# Patient Record
Sex: Female | Born: 1935 | Race: White | Hispanic: No | Smoking: Never smoker
Health system: Southern US, Community
[De-identification: ages and names within clinical notes are randomized; demographics above are authoritative.]

## PROBLEM LIST (undated history)

## (undated) DIAGNOSIS — N289 Disorder of kidney and ureter, unspecified: Secondary | ICD-10-CM

## (undated) DIAGNOSIS — N183 Chronic kidney disease, stage 3 unspecified: Secondary | ICD-10-CM

## (undated) DIAGNOSIS — I4430 Unspecified atrioventricular block: Secondary | ICD-10-CM

## (undated) DIAGNOSIS — B019 Varicella without complication: Secondary | ICD-10-CM

## (undated) DIAGNOSIS — E039 Hypothyroidism, unspecified: Secondary | ICD-10-CM

## (undated) DIAGNOSIS — F419 Anxiety disorder, unspecified: Secondary | ICD-10-CM

## (undated) DIAGNOSIS — Z8674 Personal history of sudden cardiac arrest: Secondary | ICD-10-CM

## (undated) DIAGNOSIS — K5790 Diverticulosis of intestine, part unspecified, without perforation or abscess without bleeding: Secondary | ICD-10-CM

## (undated) DIAGNOSIS — I10 Essential (primary) hypertension: Secondary | ICD-10-CM

## (undated) DIAGNOSIS — I469 Cardiac arrest, cause unspecified: Secondary | ICD-10-CM

## (undated) DIAGNOSIS — M8580 Other specified disorders of bone density and structure, unspecified site: Secondary | ICD-10-CM

## (undated) DIAGNOSIS — D649 Anemia, unspecified: Secondary | ICD-10-CM

## (undated) DIAGNOSIS — E785 Hyperlipidemia, unspecified: Secondary | ICD-10-CM

## (undated) HISTORY — PX: FOOT SURGERY: SHX648

## (undated) HISTORY — DX: Other specified disorders of bone density and structure, unspecified site: M85.80

## (undated) HISTORY — DX: Hyperlipidemia, unspecified: E78.5

## (undated) HISTORY — DX: Personal history of sudden cardiac arrest: Z86.74

## (undated) HISTORY — DX: Anemia, unspecified: D64.9

## (undated) HISTORY — DX: Hypothyroidism, unspecified: E03.9

## (undated) HISTORY — DX: Diverticulosis of intestine, part unspecified, without perforation or abscess without bleeding: K57.90

## (undated) HISTORY — DX: Varicella without complication: B01.9

## (undated) HISTORY — PX: CATARACT EXTRACTION, BILATERAL: SHX1313

---

## 1968-03-04 HISTORY — PX: TUBAL LIGATION: SHX77

## 1968-03-04 HISTORY — PX: APPENDECTOMY: SHX54

## 2004-04-30 ENCOUNTER — Ambulatory Visit: Payer: Self-pay | Admitting: Family Medicine

## 2005-02-27 ENCOUNTER — Ambulatory Visit: Payer: Self-pay | Admitting: Ophthalmology

## 2005-05-09 ENCOUNTER — Ambulatory Visit: Payer: Self-pay | Admitting: Family Medicine

## 2005-05-20 ENCOUNTER — Observation Stay: Payer: Self-pay | Admitting: Ophthalmology

## 2005-05-20 ENCOUNTER — Other Ambulatory Visit: Payer: Self-pay

## 2005-06-17 ENCOUNTER — Ambulatory Visit: Payer: Self-pay | Admitting: Ophthalmology

## 2006-04-29 ENCOUNTER — Ambulatory Visit: Payer: Self-pay | Admitting: Gastroenterology

## 2006-04-29 HISTORY — PX: COLONOSCOPY: SHX174

## 2006-05-14 ENCOUNTER — Ambulatory Visit: Payer: Self-pay | Admitting: Family Medicine

## 2007-07-02 ENCOUNTER — Ambulatory Visit: Payer: Self-pay | Admitting: Family Medicine

## 2008-06-09 DIAGNOSIS — I4430 Unspecified atrioventricular block: Secondary | ICD-10-CM

## 2008-06-09 HISTORY — DX: Unspecified atrioventricular block: I44.30

## 2008-07-06 ENCOUNTER — Ambulatory Visit: Payer: Self-pay | Admitting: Family Medicine

## 2009-07-10 ENCOUNTER — Ambulatory Visit: Payer: Self-pay | Admitting: Family Medicine

## 2010-03-01 ENCOUNTER — Ambulatory Visit: Payer: Self-pay | Admitting: Orthopedic Surgery

## 2010-04-03 ENCOUNTER — Ambulatory Visit: Payer: Self-pay | Admitting: Pain Medicine

## 2010-04-16 ENCOUNTER — Ambulatory Visit: Payer: Self-pay | Admitting: Pain Medicine

## 2010-07-12 ENCOUNTER — Ambulatory Visit: Payer: Self-pay | Admitting: Family Medicine

## 2011-07-16 ENCOUNTER — Ambulatory Visit: Payer: Self-pay | Admitting: Internal Medicine

## 2012-05-18 ENCOUNTER — Other Ambulatory Visit: Payer: Self-pay | Admitting: Internal Medicine

## 2012-07-16 ENCOUNTER — Ambulatory Visit: Payer: Self-pay | Admitting: Internal Medicine

## 2013-01-05 ENCOUNTER — Emergency Department: Payer: Self-pay | Admitting: Emergency Medicine

## 2013-01-05 LAB — URINALYSIS, COMPLETE
Bacteria: NONE SEEN
Bilirubin,UR: NEGATIVE
Blood: NEGATIVE
Glucose,UR: NEGATIVE mg/dL (ref 0–75)
Ketone: NEGATIVE
Leukocyte Esterase: NEGATIVE
Ph: 7 (ref 4.5–8.0)
Protein: NEGATIVE
Specific Gravity: 1.005 (ref 1.003–1.030)
WBC UR: <1 /HPF (ref 0–5)

## 2013-01-05 LAB — COMPREHENSIVE METABOLIC PANEL
Alkaline Phosphatase: 59 U/L (ref 50–136)
BUN: 20 mg/dL — ABNORMAL HIGH (ref 7–18)
Bilirubin,Total: 0.3 mg/dL (ref 0.2–1.0)
Calcium, Total: 9.3 mg/dL (ref 8.5–10.1)
Chloride: 110 mmol/L — ABNORMAL HIGH (ref 98–107)
Creatinine: 1.1 mg/dL (ref 0.60–1.30)
EGFR (African American): 55 — ABNORMAL LOW
Glucose: 93 mg/dL (ref 65–99)
Osmolality: 282 (ref 275–301)
Potassium: 4.3 mmol/L (ref 3.5–5.1)
Sodium: 140 mmol/L (ref 136–145)
Total Protein: 6.7 g/dL (ref 6.4–8.2)

## 2013-01-05 LAB — CBC
HCT: 34.8 % — ABNORMAL LOW (ref 35.0–47.0)
HGB: 11.9 g/dL — ABNORMAL LOW (ref 12.0–16.0)
MCH: 32.4 pg (ref 26.0–34.0)
Platelet: 218 10*3/uL (ref 150–440)
RBC: 3.66 10*6/uL — ABNORMAL LOW (ref 3.80–5.20)
WBC: 8.1 10*3/uL (ref 3.6–11.0)

## 2013-02-11 ENCOUNTER — Ambulatory Visit: Payer: Self-pay | Admitting: Internal Medicine

## 2013-07-19 ENCOUNTER — Ambulatory Visit: Payer: Self-pay | Admitting: Internal Medicine

## 2013-12-28 DIAGNOSIS — G2581 Restless legs syndrome: Secondary | ICD-10-CM | POA: Insufficient documentation

## 2013-12-28 DIAGNOSIS — R413 Other amnesia: Secondary | ICD-10-CM | POA: Insufficient documentation

## 2013-12-28 DIAGNOSIS — I10 Essential (primary) hypertension: Secondary | ICD-10-CM | POA: Insufficient documentation

## 2013-12-28 DIAGNOSIS — R251 Tremor, unspecified: Secondary | ICD-10-CM | POA: Insufficient documentation

## 2014-06-28 DIAGNOSIS — R202 Paresthesia of skin: Secondary | ICD-10-CM | POA: Insufficient documentation

## 2014-06-28 DIAGNOSIS — G5603 Carpal tunnel syndrome, bilateral upper limbs: Secondary | ICD-10-CM | POA: Insufficient documentation

## 2015-07-21 ENCOUNTER — Other Ambulatory Visit: Payer: Self-pay | Admitting: Internal Medicine

## 2015-07-21 DIAGNOSIS — Z1231 Encounter for screening mammogram for malignant neoplasm of breast: Secondary | ICD-10-CM

## 2015-07-21 DIAGNOSIS — E034 Atrophy of thyroid (acquired): Secondary | ICD-10-CM | POA: Insufficient documentation

## 2015-08-03 ENCOUNTER — Ambulatory Visit
Admission: RE | Admit: 2015-08-03 | Discharge: 2015-08-03 | Disposition: A | Discharge status: Home / Self Care | Payer: Medicare Other | Source: Ambulatory Visit | Attending: Internal Medicine | Admitting: Internal Medicine

## 2015-08-03 ENCOUNTER — Other Ambulatory Visit: Payer: Self-pay | Admitting: Internal Medicine

## 2015-08-03 DIAGNOSIS — Z1231 Encounter for screening mammogram for malignant neoplasm of breast: Secondary | ICD-10-CM | POA: Diagnosis present

## 2016-01-08 DIAGNOSIS — R262 Difficulty in walking, not elsewhere classified: Secondary | ICD-10-CM | POA: Insufficient documentation

## 2016-06-04 DIAGNOSIS — H16223 Keratoconjunctivitis sicca, not specified as Sjogren's, bilateral: Secondary | ICD-10-CM | POA: Diagnosis not present

## 2016-06-14 ENCOUNTER — Emergency Department
Admission: EM | Admit: 2016-06-14 | Discharge: 2016-06-14 | Disposition: A | Discharge status: Home / Self Care | Payer: Commercial Managed Care - HMO | Attending: Emergency Medicine | Admitting: Emergency Medicine

## 2016-06-14 ENCOUNTER — Emergency Department: Payer: Commercial Managed Care - HMO

## 2016-06-14 ENCOUNTER — Encounter: Payer: Self-pay | Admitting: Emergency Medicine

## 2016-06-14 DIAGNOSIS — Z79899 Other long term (current) drug therapy: Secondary | ICD-10-CM | POA: Diagnosis not present

## 2016-06-14 DIAGNOSIS — F32A Depression, unspecified: Secondary | ICD-10-CM

## 2016-06-14 DIAGNOSIS — R4182 Altered mental status, unspecified: Secondary | ICD-10-CM | POA: Diagnosis present

## 2016-06-14 DIAGNOSIS — Z7982 Long term (current) use of aspirin: Secondary | ICD-10-CM | POA: Diagnosis not present

## 2016-06-14 DIAGNOSIS — F329 Major depressive disorder, single episode, unspecified: Secondary | ICD-10-CM | POA: Diagnosis not present

## 2016-06-14 DIAGNOSIS — F321 Major depressive disorder, single episode, moderate: Secondary | ICD-10-CM

## 2016-06-14 DIAGNOSIS — R410 Disorientation, unspecified: Secondary | ICD-10-CM | POA: Diagnosis not present

## 2016-06-14 DIAGNOSIS — I10 Essential (primary) hypertension: Secondary | ICD-10-CM | POA: Diagnosis not present

## 2016-06-14 HISTORY — DX: Disorder of kidney and ureter, unspecified: N28.9

## 2016-06-14 HISTORY — DX: Anxiety disorder, unspecified: F41.9

## 2016-06-14 HISTORY — DX: Essential (primary) hypertension: I10

## 2016-06-14 LAB — COMPREHENSIVE METABOLIC PANEL
ALT: 23 U/L (ref 14–54)
AST: 30 U/L (ref 15–41)
Albumin: 4.2 g/dL (ref 3.5–5.0)
Alkaline Phosphatase: 64 U/L (ref 38–126)
Anion gap: 3 — ABNORMAL LOW (ref 5–15)
BUN: 20 mg/dL (ref 6–20)
CO2: 32 mmol/L (ref 22–32)
Calcium: 9.8 mg/dL (ref 8.9–10.3)
Chloride: 104 mmol/L (ref 101–111)
Creatinine, Ser: 1.33 mg/dL — ABNORMAL HIGH (ref 0.44–1.00)
GFR calc Af Amer: 42 mL/min — ABNORMAL LOW (ref >60)
GFR calc non Af Amer: 36 mL/min — ABNORMAL LOW (ref >60)
Glucose, Bld: 99 mg/dL (ref 65–99)
Potassium: 4.4 mmol/L (ref 3.5–5.1)
Sodium: 139 mmol/L (ref 135–145)
Total Bilirubin: 0.5 mg/dL (ref 0.3–1.2)
Total Protein: 6.6 g/dL (ref 6.5–8.1)

## 2016-06-14 LAB — URINALYSIS, COMPLETE (UACMP) WITH MICROSCOPIC
BILIRUBIN URINE: NEGATIVE
Bacteria, UA: NONE SEEN
Glucose, UA: NEGATIVE mg/dL
HGB URINE DIPSTICK: NEGATIVE
KETONES UR: NEGATIVE mg/dL
LEUKOCYTES UA: NEGATIVE
NITRITE: NEGATIVE
PH: 8 (ref 5.0–8.0)
Protein, ur: NEGATIVE mg/dL
SPECIFIC GRAVITY, URINE: 1.005 (ref 1.005–1.030)

## 2016-06-14 LAB — CBC
HCT: 36 % (ref 35.0–47.0)
HEMOGLOBIN: 12 g/dL (ref 12.0–16.0)
MCH: 31.6 pg (ref 26.0–34.0)
MCHC: 33.3 g/dL (ref 32.0–36.0)
MCV: 94.9 fL (ref 80.0–100.0)
PLATELETS: 250 10*3/uL (ref 150–440)
RBC: 3.79 MIL/uL — ABNORMAL LOW (ref 3.80–5.20)
RDW: 12.8 % (ref 11.5–14.5)
WBC: 6.9 10*3/uL (ref 3.6–11.0)

## 2016-06-14 LAB — TROPONIN I: Troponin I: <0.03 ng/mL (ref <0.03)

## 2016-06-14 LAB — TSH: TSH: 8.455 u[IU]/mL — AB (ref 0.350–4.500)

## 2016-06-14 MED ORDER — CITALOPRAM HYDROBROMIDE 20 MG PO TABS: 20.0000 mg | ORAL_TABLET | Freq: Every day | ORAL | 0 refills | Status: DC

## 2016-06-14 NOTE — ED Triage Notes (Signed)
Pt to ed with family and reports "Something doesn't feel right"  Reports she started feeling "funny" last night but can not say exactly what it is.  Pt is alert and oriented x 4.  Pt skin warm and dry.  Pt tearful at triage, however denies any new pain.  Denies headaches, denies chest pain, denies sob.

## 2016-06-14 NOTE — ED Notes (Signed)
AAOx3.  Skin warm and dry.  NAD 

## 2016-06-14 NOTE — ED Provider Notes (Signed)
[facility] Emergency Department Provider Note   ____________________________________________   First MD Initiated Contact with Patient 06/14/16 1254     (approximate)  I have reviewed the triage vital signs and the nursing notes.   HISTORY  Chief Complaint Altered Mental Status   HPI Carrie Calhoun is a 83 y.o. female with a history of hypertension and depression was presenting to the emergency department today complaining of worsening confusion over the past 2 weeks. She is complain by her son who also says that she appears to be confused, increasingly.  The patient says that she has body aches, chronically that are unchanged otherwise does not have any acute pain. Denies any chest pain or shortness of breath. Does not take a statin. She says that she has been increasingly depressed since her husband died in 07/09/14.   Past Medical History:  Diagnosis Date  . Anxiety   . Hypertension   . Renal disorder     There are no active problems to display for this patient.   History reviewed. No pertinent surgical history.  Prior to Admission medications   Medication Sig Start Date End Date Taking? Authorizing Provider  aspirin EC 81 MG tablet Take 1 tablet by mouth daily.   Yes Historical Provider, MD  busPIRone (BUSPAR) 15 MG tablet Take 1 tablet by mouth daily. 05/13/16  Yes Historical Provider, MD  Calcium Carbonate-Vitamin D 600-400 MG-UNIT tablet Take 1 tablet by mouth daily.   Yes Historical Provider, MD  donepezil (ARICEPT) 5 MG tablet Take 1 tablet by mouth daily. 05/13/16  Yes Historical Provider, MD  levothyroxine (SYNTHROID, LEVOTHROID) 75 MCG tablet Take 1 tablet by mouth 2 (two) times daily. 03/11/16  Yes Historical Provider, MD  Lifitegrast 5 % SOLN Apply 1 drop to eye 2 (two) times daily.   Yes Historical Provider, MD  Multiple Vitamins-Minerals (HAIR/SKIN/NAILS/BIOTIN PO) Take 1 tablet by mouth daily.   Yes Historical Provider, MD  Omega-3  Fatty Acids (FISH OIL PO) Take 1 tablet by mouth daily.   Yes Historical Provider, MD    Allergies Patient has no known allergies.  Family History  Problem Relation Age of Onset  . Breast cancer Neg Hx     Social History Social History  Substance Use Topics  . Smoking status: Never Smoker  . Smokeless tobacco: Never Used  . Alcohol use No    Review of Systems Constitutional: No fever/chills Eyes: No visual changes. ENT: No sore throat. Cardiovascular: Denies chest pain. Respiratory: Denies shortness of breath. Gastrointestinal: No abdominal pain.  No nausea, no vomiting.  No diarrhea.  No constipation. Genitourinary: Negative for dysuria. Musculoskeletal: Negative for back pain. Skin: Negative for rash. Neurological: Negative for headaches, focal weakness or numbness.  10-point ROS otherwise negative.  ____________________________________________   PHYSICAL EXAM:  VITAL SIGNS: ED Triage Vitals  Enc Vitals Group     BP 06/14/16 1141 (!) 170/100     Pulse Rate 06/14/16 1141 71     Resp 06/14/16 1141 18     Temp 06/14/16 1141 98.3 F (36.8 C)     Temp Source 06/14/16 1141 Oral     SpO2 06/14/16 1141 99 %     Weight 06/14/16 1142 110 lb (49.9 kg)     Height 06/14/16 1142 5' (1.524 m)     Head Circumference --      Peak Flow --      Pain Score 06/14/16 1141 0     Pain Loc --  Pain Edu? --      Excl. in GC? --     Constitutional: Alert and oriented. Well appearing and in no acute distress. Eyes: Conjunctivae are normal. PERRL. EOMI. Head: Atraumatic. Nose: No congestion/rhinnorhea. Mouth/Throat: Mucous membranes are moist.   Neck: No stridor.   Cardiovascular: Normal rate, regular rhythm. Grossly normal heart sounds.   Respiratory: Normal respiratory effort.  No retractions. Lungs CTAB. Gastrointestinal: Soft and nontender. No distention.  Musculoskeletal: No lower extremity tenderness nor edema.  No joint effusions. Neurologic:  Normal speech and  language. No gross focal neurologic deficits are appreciated.  Skin:  Skin is warm, dry and intact. No rash noted. Psychiatric: Mood and affect are normal. Speech and behavior are normal.  ____________________________________________   LABS (all labs ordered are listed, but only abnormal results are displayed)  Labs Reviewed  COMPREHENSIVE METABOLIC PANEL - Abnormal; Notable for the following:       Result Value   Creatinine, Ser 1.33 (*)    GFR calc non Af Amer 36 (*)    GFR calc Af Amer 42 (*)    Anion gap 3 (*)    All other components within normal limits  CBC - Abnormal; Notable for the following:    RBC 3.79 (*)    All other components within normal limits  TSH - Abnormal; Notable for the following:    TSH 8.455 (*)    All other components within normal limits  URINALYSIS, COMPLETE (UACMP) WITH MICROSCOPIC - Abnormal; Notable for the following:    Color, Urine STRAW (*)    APPearance CLEAR (*)    Squamous Epithelial / LPF 0-5 (*)    All other components within normal limits  TROPONIN I   ____________________________________________  EKG ED ECG REPORT I, Arelia Longest, the attending physician, personally viewed and interpreted this ECG.   Date: 06/14/2016  EKG Time: 1533  Rate: 58  Rhythm: sinus bradycardia  Axis: normal  Intervals:none  ST&T Change: Wandering baseline but without any obvious ST elevation or depression. No abnormal T-wave inversion.  ____________________________________________  RADIOLOGY  CT Head Wo Contrast (Accession [id number]) (Order [id number])  Imaging  Date: 06/14/2016 Department: [facility] EMERGENCY DEPARTMENT Released By/Authorizing: Myrna Blazer, MD (auto-released)  Exam Information   Status Exam Begun  Exam Ended   Final [99] 06/14/2016 1:59 PM 06/14/2016 2:02 PM  PACS Images   Show images for CT Head Wo Contrast  Study Result   CLINICAL DATA:  Confusion.  EXAM: CT HEAD WITHOUT  CONTRAST  TECHNIQUE: Contiguous axial images were obtained from the base of the skull through the vertex without intravenous contrast.  COMPARISON:  CT scan of January 05, 2013.  FINDINGS: Brain: Mild chronic ischemic white matter disease is noted. No mass effect or midline shift is noted. Ventricular size is within normal limits. There is no evidence of mass lesion, hemorrhage or acute infarction.  Vascular: No hyperdense vessel or unexpected calcification.  Skull: Normal. Negative for fracture or focal lesion.  Sinuses/Orbits: No acute finding.  Other: None.  IMPRESSION: Mild chronic ischemic white matter disease. No acute intracranial abnormality seen.   Electronically Signed   By: Lupita Raider, M.D.   On: 06/14/2016 14:11   DG Chest 2 View (Accession [id number]) (Order [id number])  Imaging  Date: 06/14/2016 Department: [facility] EMERGENCY DEPARTMENT Released By/Authorizing: Myrna Blazer, MD (auto-released)  Exam Information   Status Exam Begun  Exam Ended   Final [99] 06/14/2016 2:25  PM 06/14/2016 2:26 PM  PACS Images   Show images for DG Chest 2 View  Study Result   CLINICAL DATA:  Confusion, blunting the heel right  EXAM: CHEST  2 VIEW  COMPARISON:  None.  FINDINGS: The lungs are hyperinflated likely secondary to COPD. There is no focal parenchymal opacity. There is no pleural effusion or pneumothorax. There is stable cardiomegaly.  The osseous structures are unremarkable.  IMPRESSION: No active cardiopulmonary disease.   Electronically Signed   By: Elige Ko   On: 06/14/2016 14:32     ____________________________________________   PROCEDURES  Procedure(s) performed:   Procedures  Critical Care performed:   ____________________________________________   INITIAL IMPRESSION / ASSESSMENT AND PLAN / ED COURSE  Pertinent labs & imaging results that were available during my care  of the patient were reviewed by me and considered in my medical decision making (see chart for details).  ----------------------------------------- 3:19 PM on 06/14/2016 -----------------------------------------  Reassuring blood work. Pending final evaluation by Dr. Toni Amend of psychiatry. Signed out to Dr. Lenard Lance.      ____________________________________________   FINAL CLINICAL IMPRESSION(S) / ED DIAGNOSES  Depression. Confusion.    NEW MEDICATIONS STARTED DURING THIS VISIT:  New Prescriptions   No medications on file     Note:  This document was prepared using Dragon voice recognition software and may include unintentional dictation errors.    Myrna Blazer, MD 06/14/16 [phone]

## 2016-06-14 NOTE — ED Provider Notes (Addendum)
-----------------------------------------   3:32 PM on 06/14/2016 -----------------------------------------  Patient care assumed from Dr. Langston Masker. Patient's medical workup has been largely nonrevealing, besides a elevated TSH.  Discussed with the patient and family to follow up with the primary care doctor regarding this as the patient may need to be on an increased dose of levothyroxine. Psychiatry has seen the patient and has prescribed an SSRI for the patient. We will discharge the patient with PCP follow-up.  EKG reviewed and interpreted by myself shows normal sinus rhythm at 58 bpm, narrow QRS, left axis deviation, largely normal intervals with nonspecific ST changes. No ST elevation.   Minna Antis, MD 06/14/16 1537    Minna Antis, MD 06/14/16 1538

## 2016-06-14 NOTE — Consult Note (Signed)
[facility] Face-to-Face Psychiatry Consult   Reason for Consult:  Consult for 83 year old woman brought to the emergency room by her son for concerns about "feeling odd" Referring Physician:  Muir Patient Identification: Carrie Calhoun MRN:  [id number] Principal Diagnosis: Moderate major depression, single episode [facility]) Diagnosis:   Patient Active Problem List   Diagnosis Date Noted  . Moderate major depression, single episode ([place]) [F32.1] 06/14/2016    Total Time spent with patient: 1 hour  Subjective:   Carrie Calhoun is a 83 y.o. female patient admitted with "I was not feeling right".  HPI:  Patient interviewed. Chart reviewed. A 44-year-old woman presented to the emergency room today with complaints that she was just not feeling right in some way. On interview the patient says she woke up this morning and she was feeling "odd". When I tried to talk her through exactly how she was feeling she was able to identify that she was feeling nervous and anxious. Furthermore she could identify several things she was anxious about. Apparently she is having pain in a tooth and was supposed to call a dentist to schedule treatment and she is nervous about that. Additionally she feels that she had an argument with her daughter yesterday and is still upset about that. Arts that her mood has been down negative and bad and seems like it's probably been getting a little worse. She has very little in her life that she still enjoys. Energy level is low. Still cries frequently. Sleep is poor at night with lots of awakening in the evening. Her appetite is not very good. Patient admits that she has occasionally had some thoughts about thinking she would be better off dead although she is very clear that she would never do anything intentionally to harm herself. She is not having any active hallucinations. She has been prescribed buspirone by her primary care doctor and it sounds like she has been only  intermittent with her compliance with it.  Social history: Patient is living by herself although she has at least 2 of her adult children locally who are helping her. Her husband died what sounds like maybe a year to a year and a half ago. He still goes to church regularly but has little other social contact.  Medical history: Generally pretty healthy. She takes levothyroxin.  Substance abuse history: Denies that she's ever used alcohol or any other drugs  Past Psychiatric History: Apparently has never seen a psychiatrist in the past. Her primary care doctor has prescribed buspirone for her. The dose seems to of varied between 5 mg 3 times a day to 15 mg once or twice a day to an order to take it as needed. Patient seems a little confused about its use and indication. She does not know of any other past psychiatric medicines. She does see a neurologist currently for concerns about dementia and takes a low dose of Aricept. No history of hospitalization or suicide attempt  Risk to Self: Is patient at risk for suicide?: No Risk to Others:   Prior Inpatient Therapy:   Prior Outpatient Therapy:    Past Medical History:  Past Medical History:  Diagnosis Date  . Anxiety   . Hypertension   . Renal disorder    History reviewed. No pertinent surgical history. Family History:  Family History  Problem Relation Age of Onset  . Breast cancer Neg Hx    Family Psychiatric  History: No known family psychiatric history Social History:  History  Alcohol  Use No     History  Drug Use No    Social History   Social History  . Marital status: Widowed    Spouse name: N/A  . Number of children: N/A  . Years of education: N/A   Social History Main Topics  . Smoking status: Never Smoker  . Smokeless tobacco: Never Used  . Alcohol use No  . Drug use: No  . Sexual activity: Not Currently   Other Topics Concern  . None   Social History Narrative  . None   Additional Social History:     Allergies:  No Known Allergies  Labs:  Results for orders placed or performed during the hospital encounter of 06/14/16 (from the past 48 hour(s))  Comprehensive metabolic panel     Status: Abnormal   Collection Time: 06/14/16 11:48 AM  Result Value Ref Range   Sodium 139 135 - 145 mmol/L   Potassium 4.4 3.5 - 5.1 mmol/L   Chloride 104 101 - 111 mmol/L   CO2 32 22 - 32 mmol/L   Glucose, Bld 99 65 - 99 mg/dL   BUN 20 6 - 20 mg/dL   Creatinine, Ser 1.33 (H) 0.44 - 1.00 mg/dL   Calcium 9.8 8.9 - 10.3 mg/dL   Total Protein 6.6 6.5 - 8.1 g/dL   Albumin 4.2 3.5 - 5.0 g/dL   AST 30 15 - 41 U/L   ALT 23 14 - 54 U/L   Alkaline Phosphatase 64 38 - 126 U/L   Total Bilirubin 0.5 0.3 - 1.2 mg/dL   GFR calc non Af Amer 36 (L) >60 mL/min   GFR calc Af Amer 42 (L) >60 mL/min    Comment: (NOTE) The eGFR has been calculated using the CKD EPI equation. This calculation has not been validated in all clinical situations. eGFR's persistently <60 mL/min signify possible Chronic Kidney Disease.    Anion gap 3 (L) 5 - 15  CBC     Status: Abnormal   Collection Time: 06/14/16 11:48 AM  Result Value Ref Range   WBC 6.9 3.6 - 11.0 K/uL   RBC 3.79 (L) 3.80 - 5.20 MIL/uL   Hemoglobin 12.0 12.0 - 16.0 g/dL   HCT 36.0 35.0 - 47.0 %   MCV 94.9 80.0 - 100.0 fL   MCH 31.6 26.0 - 34.0 pg   MCHC 33.3 32.0 - 36.0 g/dL   RDW 12.8 11.5 - 14.5 %   Platelets 250 150 - 440 K/uL  TSH     Status: Abnormal   Collection Time: 06/14/16 11:48 AM  Result Value Ref Range   TSH 8.455 (H) 0.350 - 4.500 uIU/mL    Comment: Performed by a 3rd Generation assay with a functional sensitivity of <=0.01 uIU/mL.  Troponin I     Status: None   Collection Time: 06/14/16 11:48 AM  Result Value Ref Range   Troponin I <0.03 <0.03 ng/mL  Urinalysis, Complete w Microscopic     Status: Abnormal   Collection Time: 06/14/16  1:50 PM  Result Value Ref Range   Color, Urine STRAW (A) YELLOW   APPearance CLEAR (A) CLEAR   Specific  Gravity, Urine 1.005 1.005 - 1.030   pH 8.0 5.0 - 8.0   Glucose, UA NEGATIVE NEGATIVE mg/dL   Hgb urine dipstick NEGATIVE NEGATIVE   Bilirubin Urine NEGATIVE NEGATIVE   Ketones, ur NEGATIVE NEGATIVE mg/dL   Protein, ur NEGATIVE NEGATIVE mg/dL   Nitrite NEGATIVE NEGATIVE   Leukocytes, UA NEGATIVE NEGATIVE  RBC / HPF 0-5 0 - 5 RBC/hpf   WBC, UA 0-5 0 - 5 WBC/hpf   Bacteria, UA NONE SEEN NONE SEEN   Squamous Epithelial / LPF 0-5 (A) NONE SEEN    No current facility-administered medications for this encounter.    Current Outpatient Prescriptions  Medication Sig Dispense Refill  . aspirin EC 81 MG tablet Take 1 tablet by mouth daily.    . busPIRone (BUSPAR) 15 MG tablet Take 1 tablet by mouth daily.    . Calcium Carbonate-Vitamin D 600-400 MG-UNIT tablet Take 1 tablet by mouth daily.    Marland Kitchen donepezil (ARICEPT) 5 MG tablet Take 1 tablet by mouth daily.    Marland Kitchen levothyroxine (SYNTHROID, LEVOTHROID) 75 MCG tablet Take 1 tablet by mouth 2 (two) times daily.    . Lifitegrast 5 % SOLN Apply 1 drop to eye 2 (two) times daily.    . Multiple Vitamins-Minerals (HAIR/SKIN/NAILS/BIOTIN PO) Take 1 tablet by mouth daily.    . Omega-3 Fatty Acids (FISH OIL PO) Take 1 tablet by mouth daily.    . citalopram (CELEXA) 20 MG tablet Take 1 tablet (20 mg total) by mouth daily. 30 tablet 0    Musculoskeletal: Strength & Muscle Tone: within normal limits Gait & Station: normal Patient leans: N/A  Psychiatric Specialty Exam: Physical Exam  Nursing note and vitals reviewed. Constitutional: She appears well-developed and well-nourished.  HENT:  Head: Normocephalic and atraumatic.  Eyes: Conjunctivae are normal. Pupils are equal, round, and reactive to light.  Neck: Normal range of motion.  Cardiovascular: Regular rhythm and normal heart sounds.   Respiratory: Effort normal. No respiratory distress.  GI: Soft.  Musculoskeletal: Normal range of motion.  Neurological: She is alert.  Skin: Skin is warm and  dry.  Psychiatric: Her mood appears anxious. Her speech is delayed and tangential. She is slowed. Thought content is not paranoid. Cognition and memory are impaired. She expresses no homicidal and no suicidal ideation. She exhibits abnormal recent memory.    Review of Systems  Constitutional: Negative.   HENT: Negative.   Eyes: Negative.   Respiratory: Negative.   Cardiovascular: Negative.   Gastrointestinal: Negative.   Musculoskeletal: Negative.   Skin: Negative.   Neurological: Negative.   Psychiatric/Behavioral: Positive for depression, memory loss and suicidal ideas. Negative for hallucinations and substance abuse. The patient is nervous/anxious and has insomnia.     Blood pressure (!) 170/100, pulse 71, temperature 98.3 F (36.8 C), temperature source Oral, resp. rate 18, height 5' (1.524 m), weight 49.9 kg (110 lb), SpO2 99 %.Body mass index is 21.48 kg/m.  General Appearance: Fairly Groomed  Eye Contact:  Good  Speech:  Slow  Volume:  Decreased  Mood:  Anxious  Affect:  Constricted  Thought Process:  Descriptions of Associations: Tangential  Orientation:  Full (Time, Place, and Person)  Thought Content:  Tangential  Suicidal Thoughts:  No  Homicidal Thoughts:  No  Memory:  Immediate;   Fair Recent;   Fair Remote;   Fair  Judgement:  Fair  Insight:  Fair  Psychomotor Activity:  Decreased  Concentration:  Concentration: Fair  Recall:  [employer] of Knowledge:  Fair  Language:  Fair  Akathisia:  No  Handed:  Right  AIMS (if indicated):     Assets:  Desire for Improvement Financial Resources/Insurance Housing Physical Health Resilience Social Support  ADL's:  Intact  Cognition:  Impaired,  Mild  Sleep:        Treatment Plan Summary: Daily contact  with patient to assess and evaluate symptoms and progress in treatment, Medication management and Plan 83 year old woman who was having symptoms of anxiety this morning. The workup here has been essentially  unrevealing except to show that her TSH is elevated a bit. No sign of an infection or acute brain illness. Patient was able to identify herself that she was feeling nervous. Additionally she has multiple symptoms of depression which put together give a picture of someone with mild to moderate Maj. depression. Patient does not require hospitalization but would do well to try and get this treated. I suggest adding an antidepressant medicine and have given her a prescription for citalopram 20 mg a day and encouraged her to please follow up with her primary care doctor. I have tried to reassure her about some of the things that she is worried about. Patient is very proud that so far she feels that she does not have any dementia because she scores well on a Mini-Mental status exam but it sounds like she is having increasing trouble with things like computation and more general memory loss and possible confusion. Mild signs of early cognitive decline. Son was present at the same time and I spoke with him as well. Also spoke with emergency room physician. Prescription completed.  Disposition: Patient does not meet criteria for psychiatric inpatient admission. Supportive therapy provided about ongoing stressors.  Alethia Berthold, MD 06/14/2016 4:18 PM

## 2016-06-14 NOTE — ED Notes (Signed)
Dr. Toni Amend remains at bedside evaluating patient.

## 2016-06-14 NOTE — ED Notes (Signed)
Patient to CT Scan via stretcher.  AAOx3.  Skin warm and dry. NAD

## 2016-06-14 NOTE — ED Notes (Signed)
FIRST NURSE: pt states she has been feeling bad for two weeks.

## 2016-06-14 NOTE — Discharge Instructions (Signed)
You have been seen in the emergency department today. Her workup has shown largely normal results besides a low thyroid hormone level. Please make sure you're taking this medication every day as prescribed. Please follow-up with your doctor this week for recheck/reevaluation and recheck of your thyroid level. Please take your medication as prescribed in the emergency department today. Return to the emergency department for any personally concerning symptoms.

## 2016-06-19 DIAGNOSIS — I10 Essential (primary) hypertension: Secondary | ICD-10-CM | POA: Diagnosis not present

## 2016-06-19 DIAGNOSIS — E034 Atrophy of thyroid (acquired): Secondary | ICD-10-CM | POA: Diagnosis not present

## 2016-06-19 DIAGNOSIS — N3000 Acute cystitis without hematuria: Secondary | ICD-10-CM | POA: Diagnosis not present

## 2016-06-19 DIAGNOSIS — R413 Other amnesia: Secondary | ICD-10-CM | POA: Diagnosis not present

## 2016-06-19 DIAGNOSIS — R310 Gross hematuria: Secondary | ICD-10-CM | POA: Diagnosis not present

## 2016-07-04 ENCOUNTER — Observation Stay
Admission: EM | Admit: 2016-07-04 | Discharge: 2016-07-09 | Disposition: A | Discharge status: Skilled Nursing Facility | Payer: Medicare HMO | Attending: Internal Medicine | Admitting: Internal Medicine

## 2016-07-04 ENCOUNTER — Emergency Department: Payer: Medicare HMO

## 2016-07-04 ENCOUNTER — Encounter: Payer: Self-pay | Admitting: *Deleted

## 2016-07-04 DIAGNOSIS — Z9849 Cataract extraction status, unspecified eye: Secondary | ICD-10-CM | POA: Diagnosis not present

## 2016-07-04 DIAGNOSIS — I252 Old myocardial infarction: Secondary | ICD-10-CM | POA: Insufficient documentation

## 2016-07-04 DIAGNOSIS — Z803 Family history of malignant neoplasm of breast: Secondary | ICD-10-CM | POA: Diagnosis not present

## 2016-07-04 DIAGNOSIS — E860 Dehydration: Secondary | ICD-10-CM | POA: Insufficient documentation

## 2016-07-04 DIAGNOSIS — F321 Major depressive disorder, single episode, moderate: Secondary | ICD-10-CM | POA: Insufficient documentation

## 2016-07-04 DIAGNOSIS — I700 Atherosclerosis of aorta: Secondary | ICD-10-CM | POA: Diagnosis not present

## 2016-07-04 DIAGNOSIS — S2249XA Multiple fractures of ribs, unspecified side, initial encounter for closed fracture: Secondary | ICD-10-CM | POA: Diagnosis not present

## 2016-07-04 DIAGNOSIS — Z823 Family history of stroke: Secondary | ICD-10-CM | POA: Insufficient documentation

## 2016-07-04 DIAGNOSIS — Z7982 Long term (current) use of aspirin: Secondary | ICD-10-CM | POA: Insufficient documentation

## 2016-07-04 DIAGNOSIS — Z8249 Family history of ischemic heart disease and other diseases of the circulatory system: Secondary | ICD-10-CM | POA: Insufficient documentation

## 2016-07-04 DIAGNOSIS — Y939 Activity, unspecified: Secondary | ICD-10-CM | POA: Diagnosis not present

## 2016-07-04 DIAGNOSIS — S0990XA Unspecified injury of head, initial encounter: Secondary | ICD-10-CM | POA: Insufficient documentation

## 2016-07-04 DIAGNOSIS — I4430 Unspecified atrioventricular block: Secondary | ICD-10-CM | POA: Diagnosis not present

## 2016-07-04 DIAGNOSIS — I951 Orthostatic hypotension: Secondary | ICD-10-CM | POA: Insufficient documentation

## 2016-07-04 DIAGNOSIS — S2242XA Multiple fractures of ribs, left side, initial encounter for closed fracture: Secondary | ICD-10-CM | POA: Insufficient documentation

## 2016-07-04 DIAGNOSIS — X58XXXA Exposure to other specified factors, initial encounter: Secondary | ICD-10-CM | POA: Diagnosis not present

## 2016-07-04 DIAGNOSIS — N183 Chronic kidney disease, stage 3 (moderate): Secondary | ICD-10-CM | POA: Diagnosis not present

## 2016-07-04 DIAGNOSIS — Z79899 Other long term (current) drug therapy: Secondary | ICD-10-CM | POA: Insufficient documentation

## 2016-07-04 DIAGNOSIS — I129 Hypertensive chronic kidney disease with stage 1 through stage 4 chronic kidney disease, or unspecified chronic kidney disease: Secondary | ICD-10-CM | POA: Insufficient documentation

## 2016-07-04 DIAGNOSIS — I10 Essential (primary) hypertension: Secondary | ICD-10-CM | POA: Diagnosis not present

## 2016-07-04 DIAGNOSIS — I739 Peripheral vascular disease, unspecified: Secondary | ICD-10-CM | POA: Diagnosis not present

## 2016-07-04 DIAGNOSIS — S3992XA Unspecified injury of lower back, initial encounter: Secondary | ICD-10-CM | POA: Diagnosis not present

## 2016-07-04 DIAGNOSIS — F419 Anxiety disorder, unspecified: Secondary | ICD-10-CM | POA: Diagnosis not present

## 2016-07-04 DIAGNOSIS — R296 Repeated falls: Secondary | ICD-10-CM | POA: Diagnosis not present

## 2016-07-04 DIAGNOSIS — R531 Weakness: Secondary | ICD-10-CM | POA: Diagnosis not present

## 2016-07-04 DIAGNOSIS — W19XXXA Unspecified fall, initial encounter: Secondary | ICD-10-CM

## 2016-07-04 DIAGNOSIS — R51 Headache: Secondary | ICD-10-CM | POA: Diagnosis not present

## 2016-07-04 HISTORY — DX: Chronic kidney disease, stage 3 unspecified: N18.30

## 2016-07-04 HISTORY — DX: Chronic kidney disease, stage 3 (moderate): N18.3

## 2016-07-04 HISTORY — DX: Cardiac arrest, cause unspecified: I46.9

## 2016-07-04 HISTORY — DX: Unspecified atrioventricular block: I44.30

## 2016-07-04 LAB — URINALYSIS, COMPLETE (UACMP) WITH MICROSCOPIC
Bacteria, UA: NONE SEEN
Bilirubin Urine: NEGATIVE
GLUCOSE, UA: NEGATIVE mg/dL
Hgb urine dipstick: NEGATIVE
Ketones, ur: 5 mg/dL — AB
Leukocytes, UA: NEGATIVE
Nitrite: NEGATIVE
PH: 7 (ref 5.0–8.0)
Protein, ur: NEGATIVE mg/dL
SPECIFIC GRAVITY, URINE: 1.01 (ref 1.005–1.030)
Squamous Epithelial / LPF: NONE SEEN
WBC, UA: NONE SEEN WBC/hpf (ref 0–5)

## 2016-07-04 LAB — COMPREHENSIVE METABOLIC PANEL
ALK PHOS: 61 U/L (ref 38–126)
ALT: 21 U/L (ref 14–54)
AST: 30 U/L (ref 15–41)
Albumin: 3.9 g/dL (ref 3.5–5.0)
Anion gap: 7 (ref 5–15)
BILIRUBIN TOTAL: 0.6 mg/dL (ref 0.3–1.2)
BUN: 28 mg/dL — AB (ref 6–20)
CALCIUM: 9.5 mg/dL (ref 8.9–10.3)
CO2: 30 mmol/L (ref 22–32)
CREATININE: 1.34 mg/dL — AB (ref 0.44–1.00)
Chloride: 101 mmol/L (ref 101–111)
GFR, EST AFRICAN AMERICAN: 41 mL/min — AB (ref >60)
GFR, EST NON AFRICAN AMERICAN: 36 mL/min — AB (ref >60)
Glucose, Bld: 96 mg/dL (ref 65–99)
Potassium: 4.6 mmol/L (ref 3.5–5.1)
Sodium: 138 mmol/L (ref 135–145)
TOTAL PROTEIN: 6.5 g/dL (ref 6.5–8.1)

## 2016-07-04 LAB — CBC WITH DIFFERENTIAL/PLATELET
Basophils Absolute: 0.1 10*3/uL (ref 0–0.1)
Basophils Relative: 1 %
EOS PCT: 2 %
Eosinophils Absolute: 0.1 10*3/uL (ref 0–0.7)
HEMATOCRIT: 33.4 % — AB (ref 35.0–47.0)
HEMOGLOBIN: 11.1 g/dL — AB (ref 12.0–16.0)
LYMPHS ABS: 1.1 10*3/uL (ref 1.0–3.6)
LYMPHS PCT: 16 %
MCH: 31.6 pg (ref 26.0–34.0)
MCHC: 33.3 g/dL (ref 32.0–36.0)
MCV: 95.1 fL (ref 80.0–100.0)
Monocytes Absolute: 0.6 10*3/uL (ref 0.2–0.9)
Monocytes Relative: 8 %
NEUTROS ABS: 5.2 10*3/uL (ref 1.4–6.5)
Neutrophils Relative %: 73 %
PLATELETS: 226 10*3/uL (ref 150–440)
RBC: 3.51 MIL/uL — AB (ref 3.80–5.20)
RDW: 13 % (ref 11.5–14.5)
WBC: 7.1 10*3/uL (ref 3.6–11.0)

## 2016-07-04 LAB — T4, FREE: FREE T4: 1.22 ng/dL — AB (ref 0.61–1.12)

## 2016-07-04 LAB — TSH: TSH: 3.958 u[IU]/mL (ref 0.350–4.500)

## 2016-07-04 MED ORDER — KETOROLAC TROMETHAMINE 15 MG/ML IJ SOLN
15.0000 mg | Freq: Four times a day (QID) | INTRAMUSCULAR | Status: DC | PRN
  Administered 2016-07-05: 15 mg via INTRAVENOUS
  Filled 2016-07-04: qty 1

## 2016-07-04 MED ORDER — HYDRALAZINE HCL 20 MG/ML IJ SOLN: 10.0000 mg | Freq: Four times a day (QID) | INTRAMUSCULAR | Status: DC | PRN

## 2016-07-04 MED ORDER — ALBUTEROL SULFATE (2.5 MG/3ML) 0.083% IN NEBU: 2.5000 mg | INHALATION_SOLUTION | RESPIRATORY_TRACT | Status: DC | PRN

## 2016-07-04 MED ORDER — DONEPEZIL HCL 5 MG PO TABS
5.0000 mg | ORAL_TABLET | Freq: Every day | ORAL | Status: DC
  Administered 2016-07-05 – 2016-07-08 (×5): 5 mg via ORAL
  Filled 2016-07-04 (×5): qty 1

## 2016-07-04 MED ORDER — DOCUSATE SODIUM 100 MG PO CAPS
100.0000 mg | ORAL_CAPSULE | Freq: Two times a day (BID) | ORAL | Status: DC
  Administered 2016-07-05 – 2016-07-08 (×8): 100 mg via ORAL
  Filled 2016-07-04 (×11): qty 1

## 2016-07-04 MED ORDER — ONDANSETRON HCL 4 MG/2ML IJ SOLN: 4.0000 mg | Freq: Four times a day (QID) | INTRAMUSCULAR | Status: DC | PRN

## 2016-07-04 MED ORDER — SENNOSIDES-DOCUSATE SODIUM 8.6-50 MG PO TABS: 1.0000 | ORAL_TABLET | Freq: Every evening | ORAL | Status: DC | PRN

## 2016-07-04 MED ORDER — LIFITEGRAST 5 % OP SOLN: 1.0000 [drp] | Freq: Two times a day (BID) | OPHTHALMIC | Status: DC

## 2016-07-04 MED ORDER — BUSPIRONE HCL 5 MG PO TABS
15.0000 mg | ORAL_TABLET | Freq: Every day | ORAL | Status: DC
  Administered 2016-07-06: 15 mg via ORAL
  Filled 2016-07-04 (×3): qty 1

## 2016-07-04 MED ORDER — SODIUM CHLORIDE 0.9 % IV BOLUS (SEPSIS)
1000.0000 mL | Freq: Once | INTRAVENOUS | Status: AC
  Administered 2016-07-04: 1000 mL via INTRAVENOUS

## 2016-07-04 MED ORDER — ASPIRIN EC 81 MG PO TBEC
81.0000 mg | DELAYED_RELEASE_TABLET | Freq: Every day | ORAL | Status: DC
  Administered 2016-07-05 – 2016-07-09 (×5): 81 mg via ORAL
  Filled 2016-07-04 (×7): qty 1

## 2016-07-04 MED ORDER — AMLODIPINE BESYLATE 10 MG PO TABS
10.0000 mg | ORAL_TABLET | Freq: Every day | ORAL | Status: DC
  Administered 2016-07-05 – 2016-07-06 (×3): 10 mg via ORAL
  Filled 2016-07-04 (×3): qty 1

## 2016-07-04 MED ORDER — HEPARIN SODIUM (PORCINE) 5000 UNIT/ML IJ SOLN
5000.0000 [IU] | Freq: Three times a day (TID) | INTRAMUSCULAR | Status: DC
  Administered 2016-07-05 – 2016-07-09 (×13): 5000 [IU] via SUBCUTANEOUS
  Filled 2016-07-04 (×12): qty 1

## 2016-07-04 MED ORDER — LEVOTHYROXINE SODIUM 75 MCG PO TABS
75.0000 ug | ORAL_TABLET | Freq: Every day | ORAL | Status: DC
  Administered 2016-07-05 – 2016-07-09 (×5): 75 ug via ORAL
  Filled 2016-07-04 (×5): qty 1

## 2016-07-04 MED ORDER — ACETAMINOPHEN 650 MG RE SUPP: 650.0000 mg | Freq: Four times a day (QID) | RECTAL | Status: DC | PRN

## 2016-07-04 MED ORDER — OXYCODONE HCL 5 MG PO TABS: 5.0000 mg | ORAL_TABLET | ORAL | Status: DC | PRN

## 2016-07-04 MED ORDER — ONDANSETRON HCL 4 MG PO TABS: 4.0000 mg | ORAL_TABLET | Freq: Four times a day (QID) | ORAL | Status: DC | PRN

## 2016-07-04 MED ORDER — SODIUM CHLORIDE 0.9 % IV SOLN
INTRAVENOUS | Status: DC
  Administered 2016-07-05: via INTRAVENOUS

## 2016-07-04 MED ORDER — ACETAMINOPHEN 325 MG PO TABS
650.0000 mg | ORAL_TABLET | Freq: Four times a day (QID) | ORAL | Status: DC | PRN
  Administered 2016-07-09: 650 mg via ORAL
  Filled 2016-07-04: qty 2

## 2016-07-04 MED ORDER — BISACODYL 5 MG PO TBEC: 5.0000 mg | DELAYED_RELEASE_TABLET | Freq: Every day | ORAL | Status: DC | PRN

## 2016-07-04 MED ORDER — CITALOPRAM HYDROBROMIDE 20 MG PO TABS
20.0000 mg | ORAL_TABLET | Freq: Every day | ORAL | Status: DC
  Administered 2016-07-05 – 2016-07-06 (×3): 20 mg via ORAL
  Filled 2016-07-04 (×3): qty 1

## 2016-07-04 MED ORDER — CALCIUM CARBONATE-VITAMIN D 500-200 MG-UNIT PO TABS
1.0000 | ORAL_TABLET | Freq: Every day | ORAL | Status: DC
  Administered 2016-07-05 – 2016-07-09 (×5): 1 via ORAL
  Filled 2016-07-04 (×6): qty 1

## 2016-07-04 NOTE — ED Triage Notes (Signed)
Pt reports having fallen multiple times thought the week and reports having hit head and left side of back. Pt reports feeling as though, "I have hurt myself worse than it seems." Pt was currently on pain medication after h having a tooth pulled and reports the pain medication has made her unsteady on her feet. Pt is able to walk with a walker without difficulty in ED. Pt reports having a headache at this time. Pt denies changes vision or numbness.   Pt is not currently on blood thinners.

## 2016-07-04 NOTE — ED Notes (Signed)
RN spoke with Dr. York CeriseForbach and obtained verbal order for head CT

## 2016-07-04 NOTE — ED Notes (Signed)
Pt up to toilet with no difficulties. Pt unable to provide urine specimen at this time

## 2016-07-04 NOTE — ED Notes (Signed)
Pt transported to Xray. 

## 2016-07-04 NOTE — H&P (Signed)
[facility] [place] [facility]   PATIENT NAME: Carrie Calhoun    MR#:  [id number]  DATE OF BIRTH:  06/19/1930  DATE OF ADMISSION:  07/04/2016  PRIMARY CARE PHYSICIAN: Lynnea FerrierBERT J KLEIN III, MD   REQUESTING/REFERRING PHYSICIAN: Nita Sicklearolina Veronese, MD  CHIEF COMPLAINT:   Chief Complaint  Patient presents with  . Head Injury  . Back Injury   Left flank pain and headache after fall HISTORY OF PRESENT ILLNESS:  Carrie Calhoun  is a 83 y.o. female with a known history of Hypertension, cardiac arrest, atrioventricular block and CKD. The patient to present to the ED with above chief complaints. He has had the feel extremely fatigued and weak for the past week and fell multiple times. He fell and hit her head and the flank to the wall 2 days ago. She complains of a headache and the left flank and back pain. But she denies any syncope, loss of consciousness or seizure. She lives alone and uses walker. Chest x-ray show multi rib fractures on the left side. CAT scan of the head didn't show any abnormality.  PAST MEDICAL HISTORY:   Past Medical History:  Diagnosis Date  . Anxiety   . Atrioventricular block   . Cardiac arrest (HCC)   . CKD (chronic kidney disease) stage 3, GFR 30-59 ml/min   . Hypertension   . Renal disorder     PAST SURGICAL HISTORY:   Past Surgical History:  Procedure Laterality Date  . APPENDECTOMY    . CATARACT EXTRACTION    . TUBAL LIGATION      SOCIAL HISTORY:   Social History  Substance Use Topics  . Smoking status: Never Smoker  . Smokeless tobacco: Never Used  . Alcohol use No    FAMILY HISTORY:   Family History  Problem Relation Age of Onset  . Hypertension Mother   . Stroke Mother   . Breast cancer Maternal Aunt     DRUG ALLERGIES:  No Known Allergies  REVIEW OF SYSTEMS:   Review of Systems  Constitutional: Positive for malaise/fatigue. Negative for chills and fever.  HENT: Negative for congestion.   Eyes:  Negative for blurred vision and double vision.  Respiratory: Negative for cough, hemoptysis, shortness of breath and stridor.   Cardiovascular: Positive for chest pain. Negative for palpitations and leg swelling.  Gastrointestinal: Negative for abdominal pain, blood in stool, constipation, diarrhea, melena, nausea and vomiting.  Genitourinary: Negative for dysuria, hematuria and urgency.  Musculoskeletal: Negative for back pain and joint pain.  Skin: Negative for itching and rash.  Neurological: Positive for weakness and headaches. Negative for dizziness, focal weakness and loss of consciousness.  Psychiatric/Behavioral: Negative for depression. The patient is not nervous/anxious.     MEDICATIONS AT HOME:   Prior to Admission medications   Medication Sig Start Date End Date Taking? Authorizing Provider  aspirin EC 81 MG tablet Take 1 tablet by mouth daily.   Yes Historical Provider, MD  busPIRone (BUSPAR) 15 MG tablet Take 1 tablet by mouth daily. 05/13/16  Yes Historical Provider, MD  Calcium Carbonate-Vitamin D 600-400 MG-UNIT tablet Take 1 tablet by mouth daily.   Yes Historical Provider, MD  citalopram (CELEXA) 20 MG tablet Take 1 tablet (20 mg total) by mouth daily. 06/14/16  Yes Audery AmelJohn T Clapacs, MD  donepezil (ARICEPT) 5 MG tablet Take 1 tablet by mouth daily. 05/13/16  Yes Historical Provider, MD  levothyroxine (SYNTHROID, LEVOTHROID) 75 MCG tablet Take 1 tablet by mouth daily before  breakfast.  03/11/16  Yes Historical Provider, MD  Lifitegrast 5 % SOLN Place 1 drop into both eyes 2 (two) times daily.    Yes Historical Provider, MD  Multiple Vitamins-Minerals (HAIR/SKIN/NAILS/BIOTIN PO) Take 1 tablet by mouth daily.   Yes Historical Provider, MD  Omega-3 Fatty Acids (FISH OIL PO) Take 1 tablet by mouth daily.   Yes Historical Provider, MD      VITAL SIGNS:  Blood pressure (!) 181/71, pulse (!) 56, temperature 97.9 F (36.6 C), temperature source Oral, resp. rate 15, height 5' (1.524  m), weight 110 lb (49.9 kg), SpO2 98 %.  PHYSICAL EXAMINATION:  Physical Exam  GENERAL:  83 y.o.-year-old patient lying in the bed with no acute distress.  EYES: Pupils equal, round, reactive to light and accommodation. No scleral icterus. Extraocular muscles intact.  HEENT: Head atraumatic, normocephalic. Oropharynx and nasopharynx clear. Right side scalp hematoma. NECK:  Supple, no jugular venous distention. No thyroid enlargement, no tenderness.  LUNGS: Normal breath sounds bilaterally, no wheezing, rales,rhonchi or crepitation. No use of accessory muscles of respiration. Left back side bruises and tenderness. CARDIOVASCULAR: S1, S2 normal. No murmurs, rubs, or gallops.  ABDOMEN: Soft, nontender, nondistended. Bowel sounds present. No organomegaly or mass.  EXTREMITIES: No pedal edema, cyanosis, or clubbing.  NEUROLOGIC: Cranial nerves II through XII are intact. Muscle strength 5/5 in all extremities. Sensation intact. Gait not checked.  PSYCHIATRIC: The patient is alert and oriented x 3.  SKIN: No obvious rash, lesion, or ulcer.   LABORATORY PANEL:   CBC  Recent Labs Lab 07/04/16 1705  WBC 7.1  HGB 11.1*  HCT 33.4*  PLT 226   ------------------------------------------------------------------------------------------------------------------  Chemistries   Recent Labs Lab 07/04/16 1705  NA 138  K 4.6  CL 101  CO2 30  GLUCOSE 96  BUN 28*  CREATININE 1.34*  CALCIUM 9.5  AST 30  ALT 21  ALKPHOS 61  BILITOT 0.6   ------------------------------------------------------------------------------------------------------------------  Cardiac Enzymes No results for input(s): TROPONINI in the last 168 hours. ------------------------------------------------------------------------------------------------------------------  RADIOLOGY:  Dg Ribs Unilateral W/chest Left  Result Date: 07/04/2016 CLINICAL DATA:  Multiple falls over the last few days. Left-sided back pain and  bruising. EXAM: LEFT RIBS AND CHEST - 3+ VIEW COMPARISON:  06/14/2016 FINDINGS: Chronic enlargement of cardiac silhouette. Chronic aortic atherosclerosis. There is a small left effusion with mild left base atelectasis. Bilateral nipple shadows. Chronic spinal curvature convex to the left. Left rib details show nondisplaced fractures of the left seventh and eighth ribs antro laterally. Minimally displaced fractures of the left ninth and tenth ribs posteriorly. IMPRESSION: Nondisplaced fractures of the left seventh and eighth ribs antero laterally. Minimally displaced fractures of the left ninth and tenth ribs posteriorly. Mild atelectasis at the left base and small effusion probably secondary to that. Electronically Signed   By: Paulina Fusi M.D.   On: 07/04/2016 17:19   Ct Head Wo Contrast  Result Date: 07/04/2016 CLINICAL DATA:  Pt reports having fallen multiple times thought the week and reports having hit head and left side of back. Pt reports feeling as though, "I have hurt myself worse than it seems." Pt was currently on pain medication after having teeth pulled. Headache. EXAM: CT HEAD WITHOUT CONTRAST TECHNIQUE: Contiguous axial images were obtained from the base of the skull through the vertex without intravenous contrast. COMPARISON:  06/14/2016 FINDINGS: Brain: There is central and cortical atrophy. Periventricular white matter changes are consistent with small vessel disease. There is no intra or extra-axial fluid collection  or mass lesion. The basilar cisterns and ventricles have a normal appearance. There is no CT evidence for acute infarction or hemorrhage. Vascular: There is atherosclerotic calcification of the carotid siphons. Skull: Normal. Negative for fracture or focal lesion. Sinuses/Orbits: No acute finding. Other: None IMPRESSION: 1.  No evidence for acute  abnormality. 2. Atrophy and small vessel disease. Electronically Signed   By: Norva Pavlov M.D.   On: 07/04/2016 15:06       IMPRESSION AND PLAN:   Multiple rib fractures due to fall. The patient will be placed for observation Pain control. Fall precaution.  Hypertension urgency. The patient is not on any hypertension medication at home. I will start Norvasc 10 mg by mouth daily, start IV hydralazine when necessary.  Dehydration with CKD stage III. Start normal saline IV and follow-up BMP.  Generalized weakness. PT evaluation.  All the records are reviewed and case discussed with ED provider. Management plans discussed with the patient, her son-in-law and they are in agreement.  CODE STATUS: DO NOT RESUSCITATE.  TOTAL TIME TAKING CARE OF THIS PATIENT: 52 minutes.    Shaune Pollack M.D on 07/04/2016 at 7:46 PM  Between 7am to 6pm - Pager - [phone]  After 6pm go to [email or web address] - Social research officer, government  [facility] [place] [facility]  Office  [phone]  CC: Primary care physician; Lynnea Ferrier, MD   Note: This dictation was prepared with Dragon dictation along with smaller phrase technology. Any transcriptional errors that result from this process are unintentional.

## 2016-07-04 NOTE — ED Provider Notes (Signed)
[facility] Emergency Department Provider Note  ____________________________________________  Time seen: Approximately 5:03 PM  I have reviewed the triage vital signs and the nursing notes.   HISTORY  Chief Complaint Head Injury and Back Injury   HPI Carrie Calhoun is a 83 y.o. female with a history of anxiety, hypertension, chronic kidney disease who presents for evaluation of multiple falls and head trauma. Patient reports for the last week she has felt extremely fatigued and weak. She's been walking with her walker however has sustained multiple falls. 2 days ago she fell and hit her head onto the wall. No LOC. She denies a headache. She also hit her chest wall and has a large bruise over her left lower rib cage. She reports no shortness of breath and reports that the pain is now only present with palpation of that area, no pain without palpation. Patient recently had dental work done a week ago however has only been taking ibuprofen and Tylenol for the pain. She reports that since having the procedure she feels that she is becoming worse in terms of her level of energy. She tells me that she wants to sleep all day long.She lives alone. She walks with a walker. She denies URI symptoms, cough, fever, chills, nausea, vomiting, diarrhea, dysuria, constipation. Her last BM was yesterday. She reports that she has poor appetite but that has been going on for a long time. Of note patient was also started on Celexa 2 weeks ago for depression  Past Medical History:  Diagnosis Date  . Anxiety   . Hypertension   . Renal disorder     Patient Active Problem List   Diagnosis Date Noted  . Moderate major depression, single episode (HCC) 06/14/2016    History reviewed. No pertinent surgical history.  Prior to Admission medications   Medication Sig Start Date End Date Taking? Authorizing Provider  aspirin EC 81 MG tablet Take 1 tablet by mouth daily.    Historical  Provider, MD  busPIRone (BUSPAR) 15 MG tablet Take 1 tablet by mouth daily. 05/13/16   Historical Provider, MD  Calcium Carbonate-Vitamin D 600-400 MG-UNIT tablet Take 1 tablet by mouth daily.    Historical Provider, MD  citalopram (CELEXA) 20 MG tablet Take 1 tablet (20 mg total) by mouth daily. 06/14/16   Audery Amel, MD  donepezil (ARICEPT) 5 MG tablet Take 1 tablet by mouth daily. 05/13/16   Historical Provider, MD  levothyroxine (SYNTHROID, LEVOTHROID) 75 MCG tablet Take 1 tablet by mouth 2 (two) times daily. 03/11/16   Historical Provider, MD  Lifitegrast 5 % SOLN Apply 1 drop to eye 2 (two) times daily.    Historical Provider, MD  Multiple Vitamins-Minerals (HAIR/SKIN/NAILS/BIOTIN PO) Take 1 tablet by mouth daily.    Historical Provider, MD  Omega-3 Fatty Acids (FISH OIL PO) Take 1 tablet by mouth daily.    Historical Provider, MD    Allergies Patient has no known allergies.  Family History  Problem Relation Age of Onset  . Breast cancer Neg Hx     Social History Social History  Substance Use Topics  . Smoking status: Never Smoker  . Smokeless tobacco: Never Used  . Alcohol use No    Review of Systems  Constitutional: Negative for fever. + Generalized weakness and fatigue Eyes: Negative for visual changes. ENT: Negative for sore throat. Neck: No neck pain  Cardiovascular: Negative for chest pain. + chest wall pain Respiratory: Negative for shortness of breath. Gastrointestinal: Negative  for abdominal pain, vomiting or diarrhea. Genitourinary: Negative for dysuria. Musculoskeletal: Negative for back pain. Skin: Negative for rash. Neurological: Negative for headaches, weakness or numbness. + head trauma Psych: No SI or HI  ____________________________________________   PHYSICAL EXAM:  VITAL SIGNS: ED Triage Vitals  Enc Vitals Group     BP 07/04/16 1428 (!) 175/64     Pulse Rate 07/04/16 1428 71     Resp 07/04/16 1428 16     Temp 07/04/16 1428 97.9 F (36.6 C)       Temp Source 07/04/16 1428 Oral     SpO2 07/04/16 1428 100 %     Weight 07/04/16 1429 110 lb (49.9 kg)     Height 07/04/16 1429 5' (1.524 m)     Head Circumference --      Peak Flow --      Pain Score 07/04/16 1600 5     Pain Loc --      Pain Edu? --      Excl. in GC? --    Constitutional: Alert and oriented. No acute distress. Does not appear intoxicated. HEENT Head: Normocephalic and atraumatic. Face: No facial bony tenderness. Stable midface Ears: No hemotympanum bilaterally. No Battle sign Eyes: No eye injury. PERRL. No raccoon eyes Nose: Nontender. No epistaxis. No rhinorrhea Mouth/Throat: Mucous membranes are moist. No oropharyngeal blood. No dental injury. Airway patent without stridor. Normal voice. Neck: no C-collar in place. No midline c-spine tenderness.  Cardiovascular: Normal rate, regular rhythm. Normal and symmetric distal pulses are present in all extremities. Pulmonary/Chest: Chest wall is stable. Patient has bruising located on the lateral lower L chest wall which is tender to palpation with no crepitus and no deformities. Normal respiratory effort. Breath sounds are normal. No crepitus.  Abdominal: Soft, nontender, non distended. Musculoskeletal: Nontender with normal full range of motion in all extremities. No deformities. No thoracic or lumbar midline spinal tenderness. Pelvis is stable. Skin: Skin is warm, dry and intact. No abrasions or contutions. Psychiatric: Speech and behavior are appropriate. Neurological: Normal speech and language. Moves all extremities to command. No gross focal neurologic deficits are appreciated.  Glascow Coma Score: 4 - Opens eyes on own 6 - Follows simple motor commands 5 - Alert and oriented GCS: 15   ____________________________________________   LABS (all labs ordered are listed, but only abnormal results are displayed)  Labs Reviewed  CBC WITH DIFFERENTIAL/PLATELET - Abnormal; Notable for the following:       Result  Value   RBC 3.51 (*)    Hemoglobin 11.1 (*)    HCT 33.4 (*)    All other components within normal limits  COMPREHENSIVE METABOLIC PANEL - Abnormal; Notable for the following:    BUN 28 (*)    Creatinine, Ser 1.34 (*)    GFR calc non Af Amer 36 (*)    GFR calc Af Amer 41 (*)    All other components within normal limits  T4, FREE - Abnormal; Notable for the following:    Free T4 1.22 (*)    All other components within normal limits  URINALYSIS, COMPLETE (UACMP) WITH MICROSCOPIC - Abnormal; Notable for the following:    Color, Urine YELLOW (*)    APPearance HAZY (*)    Ketones, ur 5 (*)    All other components within normal limits  TSH   ____________________________________________  EKG  ED ECG REPORT I, Nita Sickle, the attending physician, personally viewed and interpreted this ECG.  Normal sinus rhythm, rate of 61,  normal intervals, left axis deviation, low voltage QRS, no ST elevations or depressions. unchanged from prior ____________________________________________  RADIOLOGY  CRX:  Nondisplaced fractures of the left seventh and eighth ribs antero laterally.  Minimally displaced fractures of the left ninth and tenth ribs posteriorly.  Mild atelectasis at the left base and small effusion probably secondary to that. ____________________________________________   PROCEDURES  Procedure(s) performed: None Procedures Critical Care performed:  None ____________________________________________   INITIAL IMPRESSION / ASSESSMENT AND PLAN / ED COURSE  83 y.o. female with a history of anxiety, hypertension, chronic kidney disease who presents for evaluation of multiple falls and head trauma. Differential diagnosis for generalized weakness and multiple falls is broad including polypharmacy versus dehydration versus urinary tract infection versus electrolyte abnormalities. Will check labs including thyroid, CBC, BMP, EKG. We'll also get an x-ray with dedicated reveals  to rule out rib fractures. Head CT was done in the waiting room and that's negative.    _________________________ 7:00 PM on 07/04/2016 -----------------------------------------  Labs and urine with no acute finding. Chest x-ray concerning for multiple nondisplaced rib fractures. Patient be admitted to the hospital for further management.  Pertinent labs & imaging results that were available during my care of the patient were reviewed by me and considered in my medical decision making (see chart for details).    ____________________________________________   FINAL CLINICAL IMPRESSION(S) / ED DIAGNOSES  Final diagnoses:  Fall, initial encounter  Closed fracture of multiple ribs of left side, initial encounter      NEW MEDICATIONS STARTED DURING THIS VISIT:  New Prescriptions   No medications on file     Note:  This document was prepared using Dragon voice recognition software and may include unintentional dictation errors.    Nita Sicklearolina Alvera Tourigny, MD 07/04/16 1900

## 2016-07-04 NOTE — Progress Notes (Signed)
Advanced Care Plan.  Purpose of Encounter: CODE STATUS Parties in Attendance: The patient, her son-in-law and me. Patient's Decisional Capacity: Yes. Medical Story: The patient has generalized weakness and multiple falls at home. She has multi-rib fractures and dehydration. I discussed with the patient about CODE STATUS. She hesitates to make a decision but finally she made a decision for DO NOT RESUSCITATE. Plan:  Code Status: DO NOT RESUSCITATE. Time spent discussing advance care planning: 17 minuts

## 2016-07-04 NOTE — ED Notes (Signed)
Attempted report, RN will call back

## 2016-07-05 ENCOUNTER — Encounter
Admission: RE | Admit: 2016-07-05 | Discharge: 2016-07-05 | Disposition: A | Discharge status: Home / Self Care | Payer: Medicare HMO | Source: Ambulatory Visit | Attending: Internal Medicine | Admitting: Internal Medicine

## 2016-07-05 DIAGNOSIS — S2249XA Multiple fractures of ribs, unspecified side, initial encounter for closed fracture: Secondary | ICD-10-CM | POA: Diagnosis not present

## 2016-07-05 DIAGNOSIS — R531 Weakness: Secondary | ICD-10-CM | POA: Diagnosis not present

## 2016-07-05 DIAGNOSIS — I10 Essential (primary) hypertension: Secondary | ICD-10-CM | POA: Diagnosis not present

## 2016-07-05 DIAGNOSIS — E860 Dehydration: Secondary | ICD-10-CM | POA: Diagnosis not present

## 2016-07-05 LAB — BASIC METABOLIC PANEL
Anion gap: 5 (ref 5–15)
BUN: 23 mg/dL — AB (ref 6–20)
CALCIUM: 8.8 mg/dL — AB (ref 8.9–10.3)
CO2: 27 mmol/L (ref 22–32)
Chloride: 107 mmol/L (ref 101–111)
Creatinine, Ser: 1.1 mg/dL — ABNORMAL HIGH (ref 0.44–1.00)
GFR calc Af Amer: 52 mL/min — ABNORMAL LOW (ref >60)
GFR, EST NON AFRICAN AMERICAN: 45 mL/min — AB (ref >60)
Glucose, Bld: 85 mg/dL (ref 65–99)
Potassium: 4.1 mmol/L (ref 3.5–5.1)
SODIUM: 139 mmol/L (ref 135–145)

## 2016-07-05 NOTE — Clinical Social Work Note (Signed)
Clinical Social Work Assessment  Patient Details  Name: Carrie Calhoun MRN: [id number] Date of Birth: 1930-09-11  Date of referral:  07/05/16               Reason for consult:  Facility Placement, Discharge Planning                Permission sought to share information with:  Family Supports Permission granted to share information::  Yes, Verbal Permission Granted  Name::     Carrie Calhoun  Relationship::  son  Contact Information:  [phone]  Housing/Transportation Living arrangements for the past 2 months:  [place] of Information:  Patient Patient Interpreter Needed:  None Criminal Activity/Legal Involvement Pertinent to Current Situation/Hospitalization:  No - Comment as needed Significant Relationships:  Adult Children Lives with:  Self Do you feel safe going back to the place where you live?  Yes Need for family participation in patient care:  Yes (Comment)  Care giving concerns:  No care giving concerns identified  Facilities manager / plan:  CSW met with pt to address consult for NEW SNF. CSW introduced herself and explained role of social work. CSW also explained process of discharging to SNF with [facility]. Pt would like to go to [facility] CSW reached out to facility and they will look at referral. Carrie Calhoun is pending. CSW will initiate SNF search and follow up with bed offers. CSW will continue to follow.   Employment status:  Retired Nurse, adult PT Recommendations:  [place] / Referral to community resources:  [place]  Patient/Family's Response to care:  Pt was appreciative of CSW support.   Patient/Family's Understanding of and Emotional Response to Diagnosis, Current Treatment, and Prognosis:  Pt understands that should would benefit from STR prior to returning home.   Emotional Assessment Appearance:  Appears stated age Attitude/Demeanor/Rapport:    (Appropriate) Affect (typically observed):  Accepting, Adaptable, Pleasant Orientation:  Oriented to Self, Oriented to Place, Oriented to  Time, Oriented to Situation Alcohol / Substance use:  Not Applicable Psych involvement (Current and /or in the community):  No (Comment)  Discharge Needs  Concerns to be addressed:  Adjustment to Illness Readmission within the last 30 days:  No Current discharge risk:  Chronically ill Barriers to Discharge:  Continued Medical Work up   [employer], LCSW 07/05/2016, 5:29 PM

## 2016-07-05 NOTE — [place] FL2 (Signed)
[place] MEDICAID FL2 LEVEL OF CARE SCREENING TOOL     IDENTIFICATION  Patient Name: Carrie Calhoun Birthdate: 12-30-1930 Sex: female Admission Date (Current Location): 07/04/2016  [facility] [place]   Facility and Address:  [facility], [address], [place], [place] [address]      Provider Number: [id number]  Attending Physician Name and Address:  Altamese DillingVaibhavkumar Pearla Mckinny, MD  Relative Name and Phone Number:       Current Level of Care: SNF Recommended Level of Care: Skilled Nursing Facility Prior Approval Number:    Date Approved/Denied:   PASRR Number:    Discharge Plan: SNF    Current Diagnoses: Patient Active Problem List   Diagnosis Date Noted  . Multiple rib fractures 07/04/2016  . Moderate major depression, single episode (HCC) 06/14/2016    Orientation RESPIRATION BLADDER Height & Weight     Self, Time, Situation, Place  Normal Continent Weight: 113 lb 9.6 oz (51.5 kg) Height:  5' (152.4 cm)  BEHAVIORAL SYMPTOMS/MOOD NEUROLOGICAL BOWEL NUTRITION STATUS      Continent Diet (Heart Healthy, Thin Liquids)  AMBULATORY STATUS COMMUNICATION OF NEEDS Skin   Limited Assist Verbally Normal                       Personal Care Assistance Level of Assistance  Bathing, Feeding, Dressing Bathing Assistance: Limited assistance Feeding assistance: Independent Dressing Assistance: Limited assistance     Functional Limitations Info  Hearing, Sight, Speech Sight Info: Adequate Hearing Info: Adequate Speech Info: Adequate    SPECIAL CARE FACTORS FREQUENCY  PT (By licensed PT)     PT Frequency: 5              Contractures Contractures Info: Not present    Additional Factors Info  Code Status, Allergies, Psychotropic Code Status Info: DNR Allergies Info: No known allergies Psychotropic Info: Medications:  Celexa and Buspar         Current Medications (07/05/2016):  This is the current hospital  active medication list Current Facility-Administered Medications  Medication Dose Route Frequency Provider Last Rate Last Dose  . 0.9 %  sodium chloride infusion   Intravenous Continuous Shaune PollackQing Chen, MD 50 mL/hr at 07/05/16 0026    . acetaminophen (TYLENOL) tablet 650 mg  650 mg Oral Q6H PRN Shaune PollackQing Chen, MD       Or  . acetaminophen (TYLENOL) suppository 650 mg  650 mg Rectal Q6H PRN Shaune PollackQing Chen, MD      . albuterol (PROVENTIL) (2.5 MG/3ML) 0.083% nebulizer solution 2.5 mg  2.5 mg Nebulization Q2H PRN Shaune PollackQing Chen, MD      . amLODipine (NORVASC) tablet 10 mg  10 mg Oral Daily Shaune PollackQing Chen, MD   10 mg at 07/05/16 0947  . aspirin EC tablet 81 mg  81 mg Oral Daily Shaune PollackQing Chen, MD   81 mg at 07/05/16 0947  . bisacodyl (DULCOLAX) EC tablet 5 mg  5 mg Oral Daily PRN Shaune PollackQing Chen, MD      . busPIRone (BUSPAR) tablet 15 mg  15 mg Oral Daily Shaune PollackQing Chen, MD      . calcium-vitamin D (OSCAL WITH D) 500-200 MG-UNIT per tablet 1 tablet  1 tablet Oral Daily Shaune PollackQing Chen, MD   1 tablet at 07/05/16 0947  . citalopram (CELEXA) tablet 20 mg  20 mg Oral Daily Shaune PollackQing Chen, MD   20 mg at 07/05/16 0947  . docusate sodium (COLACE) capsule 100 mg  100 mg  Oral BID Shaune Pollack, MD   100 mg at 07/05/16 0019  . donepezil (ARICEPT) tablet 5 mg  5 mg Oral Daily Shaune Pollack, MD   5 mg at 07/05/16 0022  . heparin injection 5,000 Units  5,000 Units Subcutaneous Q8H Shaune Pollack, MD   5,000 Units at 07/05/16 1452  . hydrALAZINE (APRESOLINE) injection 10 mg  10 mg Intravenous Q6H PRN Shaune Pollack, MD      . ketorolac (TORADOL) 15 MG/ML injection 15 mg  15 mg Intravenous Q6H PRN Shaune Pollack, MD   15 mg at 07/05/16 0024  . levothyroxine (SYNTHROID, LEVOTHROID) tablet 75 mcg  75 mcg Oral QAC breakfast Shaune Pollack, MD   75 mcg at 07/05/16 0755  . Lifitegrast 5 % SOLN 1 drop  1 drop Both Eyes BID Shaune Pollack, MD      . ondansetron [facility]) tablet 4 mg  4 mg Oral Q6H PRN Shaune Pollack, MD       Or  . ondansetron [facility]) injection 4 mg  4 mg Intravenous Q6H PRN Shaune Pollack, MD       . oxyCODONE (Oxy IR/ROXICODONE) immediate release tablet 5 mg  5 mg Oral Q4H PRN Shaune Pollack, MD      . senna-docusate (Senokot-S) tablet 1 tablet  1 tablet Oral QHS PRN Shaune Pollack, MD         Discharge Medications: Please see discharge summary for a list of discharge medications.  Relevant Imaging Results:  Relevant Lab Results:   Additional Information SSN:  [id number]  Dede Query, LCSW
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
## 2016-07-05 NOTE — Care Management (Signed)
Met with patient and her son in law to explain Medicare Observation. She agrees to [employer] authorization and PT recommendation. She has a rollator not a front-wheeled walker at home. CSW updated.

## 2016-07-05 NOTE — Care Management Obs Status (Signed)
MEDICARE OBSERVATION STATUS NOTIFICATION   Patient Details  Name: Carrie Calhoun MRN: [id number] Date of Birth: 11-15-1930   Medicare Observation Status Notification Given:  Yes    Collie SiadAngela Dodd Schmid, RN 07/05/2016, 2:37 PM

## 2016-07-05 NOTE — Progress Notes (Signed)
[employer] [facility] [facility] [place] [facility]   PATIENT NAME: Carrie Calhoun    MR#:  [id number]  DATE OF BIRTH:  01/20/1931  SUBJECTIVE:  CHIEF COMPLAINT:   Chief Complaint  Patient presents with  . Head Injury  . Back Injury     She was recently in ER - 3 weeks ago and started on citalopram by psych, felt dizzi, so PMD decreased dose in half , still cont to feel dizzi and started to fall last few days.  had pain after fall and echymosis- found to have fracture ribs.  feels fine , seen by PT.  REVIEW OF SYSTEMS:  CONSTITUTIONAL: No fever,positive for fatigue or weakness.  EYES: No blurred or double vision.  EARS, NOSE, AND THROAT: No tinnitus or ear pain.  RESPIRATORY: No cough, shortness of breath, wheezing or hemoptysis.  CARDIOVASCULAR: No chest pain, orthopnea, edema.  GASTROINTESTINAL: No nausea, vomiting, diarrhea or abdominal pain.  GENITOURINARY: No dysuria, hematuria.  ENDOCRINE: No polyuria, nocturia,  HEMATOLOGY: No anemia, easy bruising or bleeding SKIN: No rash or lesion. MUSCULOSKELETAL: No joint pain or arthritis.   NEUROLOGIC: No tingling, numbness, weakness.  PSYCHIATRY: No anxiety or depression.   ROS  DRUG ALLERGIES:  No Known Allergies  VITALS:  Blood pressure 135/64, pulse 62, temperature 98.2 F (36.8 C), temperature source Oral, resp. rate 18, height 5' (1.524 m), weight 51.5 kg (113 lb 9.6 oz), SpO2 96 %.  PHYSICAL EXAMINATION:  GENERAL:  83 y.o.-year-old patient lying in the bed with no acute distress.  EYES: Pupils equal, round, reactive to light and accommodation. No scleral icterus. Extraocular muscles intact.  HEENT: Head atraumatic, normocephalic. Oropharynx and nasopharynx clear.  NECK:  Supple, no jugular venous distention. No thyroid enlargement, no tenderness.  LUNGS: Normal breath sounds bilaterally, no wheezing, rales,rhonchi or crepitation. No use of accessory muscles of respiration.  CARDIOVASCULAR: S1, S2 normal. No  murmurs, rubs, or gallops.  ABDOMEN: Soft, nontender, nondistended. Bowel sounds present. No organomegaly or mass. Left side costvertebral area having echymosis and tenderness. EXTREMITIES: No pedal edema, cyanosis, or clubbing.  NEUROLOGIC: Cranial nerves II through XII are intact. Muscle strength 4/5 in all extremities. Sensation intact. Gait not checked.  PSYCHIATRIC: The patient is alert and oriented x 3.  SKIN: No obvious rash, lesion, or ulcer.   Physical Exam LABORATORY PANEL:   CBC  Recent Labs Lab 07/04/16 1705  WBC 7.1  HGB 11.1*  HCT 33.4*  PLT 226   ------------------------------------------------------------------------------------------------------------------  Chemistries   Recent Labs Lab 07/04/16 1705 07/05/16 0601  NA 138 139  K 4.6 4.1  CL 101 107  CO2 30 27  GLUCOSE 96 85  BUN 28* 23*  CREATININE 1.34* 1.10*  CALCIUM 9.5 8.8*  AST 30  --   ALT 21  --   ALKPHOS 61  --   BILITOT 0.6  --    ------------------------------------------------------------------------------------------------------------------  Cardiac Enzymes No results for input(s): TROPONINI in the last 168 hours. ------------------------------------------------------------------------------------------------------------------  RADIOLOGY:  Dg Ribs Unilateral W/chest Left  Result Date: 07/04/2016 CLINICAL DATA:  Multiple falls over the last few days. Left-sided back pain and bruising. EXAM: LEFT RIBS AND CHEST - 3+ VIEW COMPARISON:  06/14/2016 FINDINGS: Chronic enlargement of cardiac silhouette. Chronic aortic atherosclerosis. There is a small left effusion with mild left base atelectasis. Bilateral nipple shadows. Chronic spinal curvature convex to the left. Left rib details show nondisplaced fractures of the left seventh and eighth ribs antro laterally. Minimally displaced fractures of the left ninth and  tenth ribs posteriorly. IMPRESSION: Nondisplaced fractures of the left seventh and  eighth ribs antero laterally. Minimally displaced fractures of the left ninth and tenth ribs posteriorly. Mild atelectasis at the left base and small effusion probably secondary to that. Electronically Signed   By: Paulina FusiMark  Shogry M.D.   On: 07/04/2016 17:19   Ct Head Wo Contrast  Result Date: 07/04/2016 CLINICAL DATA:  Pt reports having fallen multiple times thought the week and reports having hit head and left side of back. Pt reports feeling as though, "I have hurt myself worse than it seems." Pt was currently on pain medication after having teeth pulled. Headache. EXAM: CT HEAD WITHOUT CONTRAST TECHNIQUE: Contiguous axial images were obtained from the base of the skull through the vertex without intravenous contrast. COMPARISON:  06/14/2016 FINDINGS: Brain: There is central and cortical atrophy. Periventricular white matter changes are consistent with small vessel disease. There is no intra or extra-axial fluid collection or mass lesion. The basilar cisterns and ventricles have a normal appearance. There is no CT evidence for acute infarction or hemorrhage. Vascular: There is atherosclerotic calcification of the carotid siphons. Skull: Normal. Negative for fracture or focal lesion. Sinuses/Orbits: No acute finding. Other: None IMPRESSION: 1.  No evidence for acute  abnormality. 2. Atrophy and small vessel disease. Electronically Signed   By: Norva PavlovElizabeth  Brown M.D.   On: 07/04/2016 15:06    ASSESSMENT AND PLAN:   Active Problems:   Multiple rib fractures  * multiple ribs fracture due to fracture   As due to falls, pt denies to use strong pain meds.   She is comfortable without any meds.   Encouraged to use spirometry.    * fall   PT eval- suggest SNF.   Pt and family agreed.   May have to wait for insurance approval.  * Hypertensive   Stable now.  * CKD stage 3   Dehydration   Improved after iv fluids  * depression    She is taking half dose of citalopram now, and son told' she was  suppose to take " half tablet every other day from now onwards as per her doctor"      All the records are reviewed and case discussed with Care Management/Social Workerr. Management plans discussed with the patient, family and they are in agreement.  CODE STATUS: DNR  TOTAL TIME TAKING CARE OF THIS PATIENT: 35 minutes.   POSSIBLE D/C IN 1-2 DAYS, DEPENDING ON CLINICAL CONDITION.   Altamese DillingVACHHANI, Janeah Kovacich M.D on 07/05/2016   Between 7am to 6pm - Pager - [phone]  After 6pm go to [email or web address] - password [email or web address]  [facility] [place] [facility]  Office  [phone]  CC: Primary care physician; Lynnea FerrierBERT J KLEIN III, MD  Note: This dictation was prepared with Dragon dictation along with smaller phrase technology. Any transcriptional errors that result from this process are unintentional.

## 2016-07-06 DIAGNOSIS — S2249XA Multiple fractures of ribs, unspecified side, initial encounter for closed fracture: Secondary | ICD-10-CM | POA: Diagnosis not present

## 2016-07-06 DIAGNOSIS — E860 Dehydration: Secondary | ICD-10-CM | POA: Diagnosis not present

## 2016-07-06 DIAGNOSIS — R531 Weakness: Secondary | ICD-10-CM | POA: Diagnosis not present

## 2016-07-06 DIAGNOSIS — I10 Essential (primary) hypertension: Secondary | ICD-10-CM | POA: Diagnosis not present

## 2016-07-06 NOTE — Progress Notes (Signed)
[employer] [facility] - [place] at [facility]   PATIENT NAME: Carrie Calhoun    MR#:  [id number]  DATE OF BIRTH:  10/06/1930  SUBJECTIVE:  CHIEF COMPLAINT:   Chief Complaint  Patient presents with  . Head Injury  . Back Injury     She was recently in ER - 3 weeks ago and started on citalopram by psych, felt dizzi, so PMD decreased dose in half , still cont to feel dizzi and started to fall last few days.  had pain after fall and echymosis- found to have fracture ribs.  feels fine , seen by PT.   Still dizzi today morning.  REVIEW OF SYSTEMS:  CONSTITUTIONAL: No fever,positive for fatigue or weakness.  EYES: No blurred or double vision.  EARS, NOSE, AND THROAT: No tinnitus or ear pain.  RESPIRATORY: No cough, shortness of breath, wheezing or hemoptysis.  CARDIOVASCULAR: No chest pain, orthopnea, edema.  GASTROINTESTINAL: No nausea, vomiting, diarrhea or abdominal pain.  GENITOURINARY: No dysuria, hematuria.  ENDOCRINE: No polyuria, nocturia,  HEMATOLOGY: No anemia, easy bruising or bleeding SKIN: No rash or lesion. MUSCULOSKELETAL: No joint pain or arthritis.   NEUROLOGIC: No tingling, numbness, weakness.  PSYCHIATRY: No anxiety or depression.   ROS  DRUG ALLERGIES:  No Known Allergies  VITALS:  Blood pressure 128/79, pulse 70, temperature 98.1 F (36.7 C), temperature source Oral, resp. rate 18, height 5' (1.524 m), weight 51.5 kg (113 lb 9.6 oz), SpO2 98 %.  PHYSICAL EXAMINATION:  GENERAL:  83 y.o.-year-old patient lying in the bed with no acute distress.  EYES: Pupils equal, round, reactive to light and accommodation. No scleral icterus. Extraocular muscles intact.  HEENT: Head atraumatic, normocephalic. Oropharynx and nasopharynx clear.  NECK:  Supple, no jugular venous distention. No thyroid enlargement, no tenderness.  LUNGS: Normal breath sounds bilaterally, no wheezing, rales,rhonchi or crepitation. No use of accessory muscles of respiration.   CARDIOVASCULAR: S1, S2 normal. No murmurs, rubs, or gallops.  ABDOMEN: Soft, nontender, nondistended. Bowel sounds present. No organomegaly or mass. Left side costvertebral area having echymosis and tenderness. EXTREMITIES: No pedal edema, cyanosis, or clubbing.  NEUROLOGIC: Cranial nerves II through XII are intact. Muscle strength 3- 4/5 in all extremities. Sensation intact. Gait not checked.  PSYCHIATRIC: The patient is alert and oriented x 3.  SKIN: No obvious rash, lesion, or ulcer.   Physical Exam LABORATORY PANEL:   CBC  Recent Labs Lab 07/04/16 1705  WBC 7.1  HGB 11.1*  HCT 33.4*  PLT 226   ------------------------------------------------------------------------------------------------------------------  Chemistries   Recent Labs Lab 07/04/16 1705 07/05/16 0601  NA 138 139  K 4.6 4.1  CL 101 107  CO2 30 27  GLUCOSE 96 85  BUN 28* 23*  CREATININE 1.34* 1.10*  CALCIUM 9.5 8.8*  AST 30  --   ALT 21  --   ALKPHOS 61  --   BILITOT 0.6  --    ------------------------------------------------------------------------------------------------------------------  Cardiac Enzymes No results for input(s): TROPONINI in the last 168 hours. ------------------------------------------------------------------------------------------------------------------  RADIOLOGY:  Dg Ribs Unilateral W/chest Left  Result Date: 07/04/2016 CLINICAL DATA:  Multiple falls over the last few days. Left-sided back pain and bruising. EXAM: LEFT RIBS AND CHEST - 3+ VIEW COMPARISON:  06/14/2016 FINDINGS: Chronic enlargement of cardiac silhouette. Chronic aortic atherosclerosis. There is a small left effusion with mild left base atelectasis. Bilateral nipple shadows. Chronic spinal curvature convex to the left. Left rib details show nondisplaced fractures of the left seventh and eighth ribs antro laterally. Minimally  displaced fractures of the left ninth and tenth ribs posteriorly. IMPRESSION:  Nondisplaced fractures of the left seventh and eighth ribs antero laterally. Minimally displaced fractures of the left ninth and tenth ribs posteriorly. Mild atelectasis at the left base and small effusion probably secondary to that. Electronically Signed   By: Paulina Fusi M.D.   On: 07/04/2016 17:19   Ct Head Wo Contrast  Result Date: 07/04/2016 CLINICAL DATA:  Pt reports having fallen multiple times thought the week and reports having hit head and left side of back. Pt reports feeling as though, "I have hurt myself worse than it seems." Pt was currently on pain medication after having teeth pulled. Headache. EXAM: CT HEAD WITHOUT CONTRAST TECHNIQUE: Contiguous axial images were obtained from the base of the skull through the vertex without intravenous contrast. COMPARISON:  06/14/2016 FINDINGS: Brain: There is central and cortical atrophy. Periventricular white matter changes are consistent with small vessel disease. There is no intra or extra-axial fluid collection or mass lesion. The basilar cisterns and ventricles have a normal appearance. There is no CT evidence for acute infarction or hemorrhage. Vascular: There is atherosclerotic calcification of the carotid siphons. Skull: Normal. Negative for fracture or focal lesion. Sinuses/Orbits: No acute finding. Other: None IMPRESSION: 1.  No evidence for acute  abnormality. 2. Atrophy and small vessel disease. Electronically Signed   By: Norva Pavlov M.D.   On: 07/04/2016 15:06    ASSESSMENT AND PLAN:   Active Problems:   Multiple rib fractures  * multiple ribs fracture due to fracture   As due to falls, pt denies to use strong pain meds.   She is comfortable without any meds.   Encouraged to use spirometry.    * fall with dizziness.   Infection work ups is negative on arrival.   Will do orthostatic vitals.   PT eval- suggest SNF.   Pt and family agreed.   May have to wait for insurance approval.  * Hypertensive   Stable now.  * CKD  stage 3   Dehydration   Improved after iv fluids  * depression    She is taking half dose of citalopram now, and son told' she was suppose to take " half tablet every other day from now onwards as per her doctor"    All the records are reviewed and case discussed with Care Management/Social Workerr. Management plans discussed with the patient, family and they are in agreement.  CODE STATUS: DNR  TOTAL TIME TAKING CARE OF THIS PATIENT: 35 minutes.   POSSIBLE D/C IN 1-2 DAYS, DEPENDING ON CLINICAL CONDITION. Waiting on passar for SNF placement.  Altamese Dilling M.D on 07/06/2016   Between 7am to 6pm - Pager - [phone]  After 6pm go to [email or web address] - password [email or web address]  [facility]  Office  [phone]  CC: Primary care physician; Lynnea Ferrier, MD  Note: This dictation was prepared with Dragon dictation along with smaller phrase technology. Any transcriptional errors that result from this process are unintentional.

## 2016-07-06 NOTE — Clinical Social Work Note (Signed)
CSW met with the patient and her adult children at bedside to discuss bed offers. The family and patient reported that they choose [facility]. The family had questions regarding insurance, transportation, and SNF process, all of which the CSW answered. Discharge is unknown at this time, but possible for Monday or Tuesday. CSW will continue to follow.  Santiago Bumpers, MSW, Latanya Presser [phone]

## 2016-07-06 NOTE — Progress Notes (Signed)
Physical Therapy Treatment Patient Details Name: Carrie Calhoun MRN: [id number] DOB: 1930-06-12 Today's Date: 07/06/2016    History of Present Illness Pt is a 83 y/o F who presented to the ED with L flank pain and headache after fall.  She reports falling and hitting her head and flank 2 days PTA.  Chest x-ray showed multi rib fractures on the L side.  CAT scan of the head didn't show any abnormality.  Pt's PMH includes AV block, cardiac arrest, CKD.    PT Comments    Pt feeling poorly this am, declined initial attempt at therapy.  Agreed on second attempt.  Bed mobility with rail and HOB raised with increased time but overall improved. She was able to stand and ambulate x 1 around unit with walker and min guard.  Gait slow and cautious but no overt LOB or buckling noted.  She reported feeling more confident with mobility skills today but due to Berg score indicating balance deficits and increased risk for fall, she would benefit from SNF to improve overall safety and confidence.  She stated she has and plans to use a rollator walker at home and will benefit from training as she demonstrates decreased safety awareness and attempts to leave walker 10' away from the bed and needed education for safety awareness.   Follow Up Recommendations  SNF     Equipment Recommendations  None recommended by PT    Recommendations for Other Services       Precautions / Restrictions Precautions Precautions: Fall Restrictions Weight Bearing Restrictions: No    Mobility  Bed Mobility Overal bed mobility: Needs Assistance Bed Mobility: Supine to Sit;Sit to Supine     Supine to sit: Min guard Sit to supine: Min guard   General bed mobility comments: Pt requires increased time and cues.    Transfers Overall transfer level: Needs assistance Equipment used: Rolling walker (2 wheeled) Transfers: Sit to/from Stand Sit to Stand: Min guard         General transfer comment: Min guard as pt  demonstrates mild instability but no LOB.   Ambulation/Gait Ambulation/Gait assistance: Min guard Ambulation Distance (Feet): 200 Feet Assistive device: Rolling walker (2 wheeled) Gait Pattern/deviations: Step-through pattern;Decreased stride length Gait velocity: decreased Gait velocity interpretation: Below normal speed for age/gender General Gait Details: Overall progressing well but continues with balance deficits   Stairs            Wheelchair Mobility    Modified Rankin (Stroke Patients Only)       Balance Overall balance assessment: Needs assistance;History of Falls Sitting-balance support: No upper extremity supported;Feet supported Sitting balance-Leahy Scale: Good     Standing balance support: Bilateral upper extremity supported Standing balance-Leahy Scale: Fair Standing balance comment: with gait                            Cognition Arousal/Alertness: Awake/alert Behavior During Therapy: WFL for tasks assessed/performed Overall Cognitive Status: Within Functional Limits for tasks assessed                                        Exercises      General Comments        Pertinent Vitals/Pain Pain Assessment: 0-10 Pain Score: 5  Pain Location: L flank with mobility Pain Descriptors / Indicators: Sore Pain Intervention(s): Limited activity within patient's tolerance  Home Living                      Prior Function            PT Goals (current goals can now be found in the care plan section) Progress towards PT goals: Progressing toward goals    Frequency    7X/week      PT Plan Current plan remains appropriate    Co-evaluation              AM-PAC PT "6 Clicks" Daily Activity  Outcome Measure  Difficulty turning over in bed (including adjusting bedclothes, sheets and blankets)?: A Little Difficulty moving from lying on back to sitting on the side of the bed? : A Little Difficulty  sitting down on and standing up from a chair with arms (e.g., wheelchair, bedside commode, etc,.)?: A Lot Help needed moving to and from a bed to chair (including a wheelchair)?: A Little Help needed walking in hospital room?: A Little Help needed climbing 3-5 steps with a railing? : A Little 6 Click Score: 17    End of Session Equipment Utilized During Treatment: Gait belt Activity Tolerance: Patient tolerated treatment well Patient left: in bed;with bed alarm set;with call bell/phone within reach;with family/visitor present         Time: 1141-1153 PT Time Calculation (min) (ACUTE ONLY): 12 min  Charges:  $Gait Training: 8-22 mins                    G Codes:       Carrie DessSarah Georgianna Calhoun, PTA 07/06/16, 12:02 PM

## 2016-07-07 DIAGNOSIS — I10 Essential (primary) hypertension: Secondary | ICD-10-CM | POA: Diagnosis not present

## 2016-07-07 DIAGNOSIS — S2249XA Multiple fractures of ribs, unspecified side, initial encounter for closed fracture: Secondary | ICD-10-CM | POA: Diagnosis not present

## 2016-07-07 DIAGNOSIS — R531 Weakness: Secondary | ICD-10-CM | POA: Diagnosis not present

## 2016-07-07 DIAGNOSIS — E860 Dehydration: Secondary | ICD-10-CM | POA: Diagnosis not present

## 2016-07-07 MED ORDER — BUSPIRONE HCL 5 MG PO TABS
7.5000 mg | ORAL_TABLET | Freq: Two times a day (BID) | ORAL | Status: DC
  Administered 2016-07-07 – 2016-07-09 (×4): 7.5 mg via ORAL
  Filled 2016-07-07 (×4): qty 2

## 2016-07-07 MED ORDER — AMLODIPINE BESYLATE 5 MG PO TABS: 5.0000 mg | ORAL_TABLET | Freq: Every day | ORAL | Status: DC

## 2016-07-07 MED ORDER — OXYCODONE HCL 5 MG PO TABS: 5.0000 mg | ORAL_TABLET | Freq: Four times a day (QID) | ORAL | Status: DC | PRN

## 2016-07-07 MED ORDER — CITALOPRAM HYDROBROMIDE 20 MG PO TABS
10.0000 mg | ORAL_TABLET | ORAL | Status: DC
  Administered 2016-07-09: 10 mg via ORAL
  Filled 2016-07-07: qty 1

## 2016-07-07 NOTE — Progress Notes (Signed)
Physical Therapy Treatment Patient Details Name: Carrie ClaraBarbara W Baiz MRN: [id number] DOB: August 14, 1930 Today's Date: 07/07/2016    History of Present Illness Pt is a 83 y/o F who presented to the ED with L flank pain and headache after fall.  She reports falling and hitting her head and flank 2 days PTA.  Chest x-ray showed multi rib fractures on the L side.  CAT scan of the head didn't show any abnormality.  Pt's PMH includes AV block, cardiac arrest, CKD.    PT Comments    Pt is expecting to go to SNF, and has worked well today with PT to practice standing balance, gait, strengthening exercises and coordination of her care.  Family is very supportive and have intention of assisting her once back home.  Will follow acutely for strength and balance as is already scheduled, and will work toward more independence as possible with gait and transfers esp with decreased rib pain today.   Follow Up Recommendations  SNF     Equipment Recommendations  None recommended by PT    Recommendations for Other Services       Precautions / Restrictions Precautions Precautions: Fall Restrictions Weight Bearing Restrictions: No    Mobility  Bed Mobility Overal bed mobility: Needs Assistance Bed Mobility: Supine to Sit;Sit to Supine     Supine to sit: Min guard Sit to supine: Min guard      Transfers Overall transfer level: Needs assistance Equipment used: None Transfers: Sit to/from Stand Sit to Stand: Min guard         General transfer comment: no AD used today  Ambulation/Gait Ambulation/Gait assistance: Min guard Ambulation Distance (Feet): 200 Feet Assistive device: Rolling walker (2 wheeled) Gait Pattern/deviations: Step-through pattern;Wide base of support;Trunk flexed;Decreased stride length Gait velocity: decreased Gait velocity interpretation: Below normal speed for age/gender General Gait Details: balance changes with gait but corrects well with vc's and RW   Stairs             Wheelchair Mobility    Modified Rankin (Stroke Patients Only)       Balance Overall balance assessment: Needs assistance;History of Falls Sitting-balance support: Feet supported Sitting balance-Leahy Scale: Good   Postural control: Posterior lean Standing balance support: Bilateral upper extremity supported Standing balance-Leahy Scale: Fair                              Cognition Arousal/Alertness: Awake/alert Behavior During Therapy: WFL for tasks assessed/performed Overall Cognitive Status: Within Functional Limits for tasks assessed                                 General Comments: impulsive behavior requiring extra cues for safety      Exercises General Exercises - Lower Extremity Ankle Circles/Pumps: AROM;Both;10 reps Long Arc Quad: Strengthening;Both;10 reps Heel Slides: Strengthening;Both;10 reps    General Comments General comments (skin integrity, edema, etc.): check of BP in standing was 129/62 and reported to nursing      Pertinent Vitals/Pain Pain Assessment: No/denies pain    Home Living                      Prior Function            PT Goals (current goals can now be found in the care plan section) Acute Rehab PT Goals Patient Stated Goal: get home Progress  towards PT goals: Progressing toward goals    Frequency    7X/week      PT Plan Current plan remains appropriate    Co-evaluation              AM-PAC PT "6 Clicks" Daily Activity  Outcome Measure  Difficulty turning over in bed (including adjusting bedclothes, sheets and blankets)?: A Little Difficulty moving from lying on back to sitting on the side of the bed? : A Little Difficulty sitting down on and standing up from a chair with arms (e.g., wheelchair, bedside commode, etc,.)?: A Little Help needed moving to and from a bed to chair (including a wheelchair)?: A Little Help needed walking in hospital room?: A Little Help  needed climbing 3-5 steps with a railing? : A Lot 6 Click Score: 17    End of Session Equipment Utilized During Treatment: Gait belt Activity Tolerance: Patient tolerated treatment well Patient left: in bed;with call bell/phone within reach;with bed alarm set;with family/visitor present;with nursing/sitter in room Nurse Communication: Mobility status PT Visit Diagnosis: Muscle weakness (generalized) (M62.81);Repeated falls (R29.6);Unsteadiness on feet (R26.81)     Time: 1610-9604 PT Time Calculation (min) (ACUTE ONLY): 38 min  Charges:  $Gait Training: 8-22 mins $Therapeutic Exercise: 8-22 mins $Therapeutic Activity: 8-22 mins                    G Codes:  Functional Assessment Tool Used: AM-PAC 6 Clicks Basic Mobility     Ivar Drape 07/07/2016, 4:13 PM   Samul Dada, PT MS Acute Rehab Dept. Number: [facility] [id number] and [facility] [phone]

## 2016-07-07 NOTE — Progress Notes (Signed)
[employer] - [place] at [facility]   PATIENT NAME: Carrie Calhoun    MR#:  [id number]  DATE OF BIRTH:  02/18/1931  SUBJECTIVE:  CHIEF COMPLAINT:   Chief Complaint  Patient presents with  . Head Injury  . Back Injury     She was recently in ER - 3 weeks ago and started on citalopram by psych, felt dizzi, so PMD decreased dose in half , still cont to feel dizzi and started to fall last few days.  had pain after fall and echymosis- found to have fracture ribs.  feels fine , seen by PT.   Still dizzi , noted to have significant drop in her BP on standing.  REVIEW OF SYSTEMS:  CONSTITUTIONAL: No fever,positive for fatigue or weakness.  EYES: No blurred or double vision.  EARS, NOSE, AND THROAT: No tinnitus or ear pain.  RESPIRATORY: No cough, shortness of breath, wheezing or hemoptysis.  CARDIOVASCULAR: No chest pain, orthopnea, edema.  GASTROINTESTINAL: No nausea, vomiting, diarrhea or abdominal pain.  GENITOURINARY: No dysuria, hematuria.  ENDOCRINE: No polyuria, nocturia,  HEMATOLOGY: No anemia, easy bruising or bleeding SKIN: No rash or lesion. MUSCULOSKELETAL: No joint pain or arthritis.   NEUROLOGIC: No tingling, numbness, weakness.  PSYCHIATRY: No anxiety or depression.   ROS  DRUG ALLERGIES:  No Known Allergies  VITALS:  Blood pressure (!) 108/54, pulse 68, temperature 97.8 F (36.6 C), temperature source Oral, resp. rate 18, height 5' (1.524 m), weight 51.5 kg (113 lb 9.6 oz), SpO2 96 %.  PHYSICAL EXAMINATION:  GENERAL:  83 y.o.-year-old patient lying in the bed with no acute distress.  EYES: Pupils equal, round, reactive to light and accommodation. No scleral icterus. Extraocular muscles intact.  HEENT: Head atraumatic, normocephalic. Oropharynx and nasopharynx clear.  NECK:  Supple, no jugular venous distention. No thyroid enlargement, no tenderness.  LUNGS: Normal breath sounds bilaterally, no wheezing, rales,rhonchi or crepitation. No use of  accessory muscles of respiration.  CARDIOVASCULAR: S1, S2 normal. No murmurs, rubs, or gallops.  ABDOMEN: Soft, nontender, nondistended. Bowel sounds present. No organomegaly or mass. Left side costvertebral area having echymosis and tenderness. EXTREMITIES: No pedal edema, cyanosis, or clubbing.  NEUROLOGIC: Cranial nerves II through XII are intact. Muscle strength 3- 4/5 in all extremities. Sensation intact. Gait not checked.  PSYCHIATRIC: The patient is alert and oriented x 3.  SKIN: No obvious rash, lesion, or ulcer.   Physical Exam LABORATORY PANEL:   CBC  Recent Labs Lab 07/04/16 1705  WBC 7.1  HGB 11.1*  HCT 33.4*  PLT 226   ------------------------------------------------------------------------------------------------------------------  Chemistries   Recent Labs Lab 07/04/16 1705 07/05/16 0601  NA 138 139  K 4.6 4.1  CL 101 107  CO2 30 27  GLUCOSE 96 85  BUN 28* 23*  CREATININE 1.34* 1.10*  CALCIUM 9.5 8.8*  AST 30  --   ALT 21  --   ALKPHOS 61  --   BILITOT 0.6  --    ------------------------------------------------------------------------------------------------------------------  Cardiac Enzymes No results for input(s): TROPONINI in the last 168 hours. ------------------------------------------------------------------------------------------------------------------  RADIOLOGY:  No results found.  ASSESSMENT AND PLAN:   Active Problems:   Multiple rib fractures  * multiple ribs fracture due to fracture   As due to falls, pt denies to use strong pain meds.   She is comfortable without any meds.   Encouraged to use spirometry.    * fall with dizziness. Orthostatic hypotension.   Infection work ups is negative on arrival.   >  20 mm HG drop in BP on orthostatic vitals.   PT eval- suggest SNF.   Pt and family agreed.   May have to wait for insurance approval.    I encouraged to use TED hose and wait for few seconds after standing up, before  starting to walk.  * Hypertensive   Stable now.    Stop meds.  * CKD stage 3   Dehydration   Improved after iv fluids  * depression    She is taking half dose of citalopram now, and son told' she was suppose to take " half tablet every other day from now onwards as per her doctor"    Decreased to 10 mg every other day.   All the records are reviewed and case discussed with Care Management/Social Workerr. Management plans discussed with the patient, family and they are in agreement.  CODE STATUS: DNR  TOTAL TIME TAKING CARE OF THIS PATIENT: 35 minutes.   POSSIBLE D/C IN 1-2 DAYS, DEPENDING ON CLINICAL CONDITION. Waiting on passar for SNF placement.  Altamese DillingVACHHANI, Tae Robak M.D on 07/07/2016   Between 7am to 6pm - Pager - [phone]  After 6pm go to [email or web address] - password [email or web address]  [facility] [place] [facility]  Office  [phone]  CC: Primary care physician; Lynnea FerrierKlein, Bert J III, MD  Note: This dictation was prepared with Dragon dictation along with smaller phrase technology. Any transcriptional errors that result from this process are unintentional.

## 2016-07-08 DIAGNOSIS — S2249XA Multiple fractures of ribs, unspecified side, initial encounter for closed fracture: Secondary | ICD-10-CM | POA: Diagnosis not present

## 2016-07-08 DIAGNOSIS — E860 Dehydration: Secondary | ICD-10-CM | POA: Diagnosis not present

## 2016-07-08 DIAGNOSIS — I10 Essential (primary) hypertension: Secondary | ICD-10-CM | POA: Diagnosis not present

## 2016-07-08 DIAGNOSIS — R531 Weakness: Secondary | ICD-10-CM | POA: Diagnosis not present

## 2016-07-08 MED ORDER — CITALOPRAM HYDROBROMIDE 10 MG PO TABS: 10.0000 mg | ORAL_TABLET | ORAL | Status: AC

## 2016-07-08 MED ORDER — ACETAMINOPHEN 325 MG PO TABS: 650.0000 mg | ORAL_TABLET | Freq: Four times a day (QID) | ORAL | Status: AC | PRN

## 2016-07-08 NOTE — Progress Notes (Signed)
Pt is resting in bed with eyes closed on rounds.

## 2016-07-08 NOTE — Care Management Important Message (Deleted)
Important Message  Patient Details  Name: Carrie Calhoun MRN: [id number] Date of Birth: [birth year]   Medicare Important Message Given:  Yes    Marily MemosLisa M Maleta Pacha, RN 07/08/2016, 3:55 PM

## 2016-07-08 NOTE — Plan of Care (Signed)
Problem: Safety: Goal: Ability to remain free from injury will improve Outcome: Progressing Pt has remained free from fall this shift. Bed alarm activated. Pt is ringing call bell for assistance.  Problem: Pain Managment: Goal: General experience of comfort will improve Outcome: Completed/Met Date Met: 07/08/16 No complaints of pain this shift.  Problem: Activity: Goal: Risk for activity intolerance will decrease Outcome: Progressing Progress pt has been ambulating to the bathroom this shift with assistance.  Problem: Nutrition: Goal: Adequate nutrition will be maintained Outcome: Progressing Eating and drinking without difficulty.

## 2016-07-08 NOTE — Progress Notes (Signed)
Shift assessment completed at 0730. Pt resting in bed, alert and oriented, denied pain, denied dizziness. Pt is on room air, lungs clear bilat, Hr is irregular, abdomen is soft, bs heard. Pt is oob to bathroom with walker prn, ppp, no edema noted. piv #20 intact to R arm, site is free of redness and swelling. Since assessment, pt has dressed herself in preparation for discharge, denied dizziness, has steady gait when ambulating with walker. Call bell in reach.

## 2016-07-08 NOTE — Progress Notes (Signed)
[facility] - [place] at [facility]   PATIENT NAME: Carrie Calhoun    MR#:  [id number]  DATE OF BIRTH:  1930/09/17  SUBJECTIVE:  CHIEF COMPLAINT:   Chief Complaint  Patient presents with  . Head Injury  . Back Injury     Chest pain improved No SOB  REVIEW OF SYSTEMS:  CONSTITUTIONAL: No fever,positive for fatigue or weakness.  EYES: No blurred or double vision.  EARS, NOSE, AND THROAT: No tinnitus or ear pain.  RESPIRATORY: No cough, shortness of breath, wheezing or hemoptysis.  CARDIOVASCULAR: No chest pain, orthopnea, edema.  GASTROINTESTINAL: No nausea, vomiting, diarrhea or abdominal pain.  GENITOURINARY: No dysuria, hematuria.  ENDOCRINE: No polyuria, nocturia,  HEMATOLOGY: No anemia, easy bruising or bleeding SKIN: No rash or lesion. MUSCULOSKELETAL: No joint pain or arthritis.   NEUROLOGIC: No tingling, numbness, weakness.  PSYCHIATRY: No anxiety or depression.   ROS  DRUG ALLERGIES:  No Known Allergies  VITALS:  Blood pressure (!) 146/67, pulse (!) 59, temperature 98 F (36.7 C), temperature source Oral, resp. rate 17, height 5' (1.524 m), weight 51.5 kg (113 lb 9.6 oz), SpO2 96 %.  PHYSICAL EXAMINATION:  GENERAL:  83 y.o.-year-old patient lying in the bed with no acute distress.  EYES: Pupils equal, round, reactive to light and accommodation. No scleral icterus. Extraocular muscles intact.  HEENT: Head atraumatic, normocephalic. Oropharynx and nasopharynx clear.  NECK:  Supple, no jugular venous distention. No thyroid enlargement, no tenderness.  LUNGS: Normal breath sounds bilaterally, no wheezing, rales,rhonchi or crepitation. No use of accessory muscles of respiration.  CARDIOVASCULAR: S1, S2 normal. No murmurs, rubs, or gallops.  ABDOMEN: Soft, nontender, nondistended. Bowel sounds present. No organomegaly or mass. Left side costvertebral area having echymosis and tenderness. EXTREMITIES: No pedal edema, cyanosis, or clubbing.   NEUROLOGIC: Cranial nerves II through XII are intact. Muscle strength 3- 4/5 in all extremities. Sensation intact. Gait not checked.  PSYCHIATRIC: The patient is alert and oriented x 3.  SKIN: No obvious rash, lesion, or ulcer.   Physical Exam LABORATORY PANEL:   CBC  Recent Labs Lab 07/04/16 1705  WBC 7.1  HGB 11.1*  HCT 33.4*  PLT 226   ------------------------------------------------------------------------------------------------------------------  Chemistries   Recent Labs Lab 07/04/16 1705 07/05/16 0601  NA 138 139  K 4.6 4.1  CL 101 107  CO2 30 27  GLUCOSE 96 85  BUN 28* 23*  CREATININE 1.34* 1.10*  CALCIUM 9.5 8.8*  AST 30  --   ALT 21  --   ALKPHOS 61  --   BILITOT 0.6  --    ------------------------------------------------------------------------------------------------------------------  Cardiac Enzymes No results for input(s): TROPONINI in the last 168 hours. ------------------------------------------------------------------------------------------------------------------  RADIOLOGY:  No results found.  ASSESSMENT AND PLAN:   Active Problems:   Multiple rib fractures  *  Multiple ribs fracture due to fall Left 7,8,9,10 Pain is well controlled She is comfortable without any meds. Encouraged to use incentive spirometry.  * Fall with dizziness. Orthostatic hypotension. Infection work ups is negative on arrival. > 20 mm HG drop in BP onorthostatic vitals. Improved PT eval- suggest SNF. May have to wait for insurance approval. Needs compression stockings. Take time from laying down/sitting to standing to prevent falls.  * Hypertensive Stable now. No meds  * CKD stage 3 Dehydration Improved after iv fluids  * depression She is taking half dose of citalopram now, per son she is supposed to take half tablet every other day from now onwards as per  her doctor. Decreased to 10 mg every other  day.     All the records are reviewed and case discussed with Care Management/Social Worker Management plans discussed with the patient, family and they are in agreement.  CODE STATUS: DNR  TOTAL TIME TAKING CARE OF THIS PATIENT: 35 minutes.   POSSIBLE D/C IN 1-2 DAYS, DEPENDING ON CLINICAL CONDITION.  Milagros LollSudini, Santina Trillo R M.D on 07/08/2016   Between 7am to 6pm - Pager - [phone]  After 6pm go to [email or web address] - password [email or web address]  [facility] [place] [facility]  Office  [phone]  CC: Primary care physician; Lynnea FerrierKlein, Bert J III, MD  Note: This dictation was prepared with Dragon dictation along with smaller phrase technology. Any transcriptional errors that result from this process are unintentional.

## 2016-07-08 NOTE — Plan of Care (Signed)
Problem: Physical Regulation: Goal: Complications related to the disease process, condition or treatment will be avoided or minimized Outcome: Completed/Met Date Met: 07/08/16 Pt continues to improve in independence, has met most goals toward discharge to snf.

## 2016-07-08 NOTE — Clinical Social Work Note (Signed)
[employer] [facility] is pending. MD and pt's daughter has been updated. [facility]. CSW will continue to follow.   Dede QuerySarah Darianny Momon, MSW, LCSW  Clinical Social Worker  [phone]

## 2016-07-08 NOTE — Discharge Summary (Addendum)
[facility] - [place] at [facility]   PATIENT NAME: Carrie Calhoun    MR#:  [id number]  DATE OF BIRTH:  1930-06-24  DATE OF ADMISSION:  07/04/2016 ADMITTING PHYSICIAN: Shaune Pollack, MD  DATE OF DISCHARGE: 07/09/2016  PRIMARY CARE PHYSICIAN: Lynnea Ferrier, MD   ADMISSION DIAGNOSIS:  Fall, initial encounter [phone]] Closed fracture of multiple ribs of left side, initial encounter [S22.42XA]  DISCHARGE DIAGNOSIS:  Active Problems:   Multiple rib fractures   SECONDARY DIAGNOSIS:   Past Medical History:  Diagnosis Date  . Anxiety   . Atrioventricular block   . Cardiac arrest (HCC)   . CKD (chronic kidney disease) stage 3, GFR 30-59 ml/min   . Hypertension   . Renal disorder      ADMITTING HISTORY  Left flank pain and headache after fall HISTORY OF PRESENT ILLNESS:  Carrie Calhoun  is a 83 y.o. female with a known history of Hypertension, cardiac arrest, atrioventricular block and CKD. The patient to present to the ED with above chief complaints. He has had the feel extremely fatigued and weak for the past week and fell multiple times. He fell and hit her head and the flank to the wall 2 days ago. She complains of a headache and the left flank and back pain. But she denies any syncope, loss of consciousness or seizure. She lives alone and uses walker. Chest x-ray show multi rib fractures on the left side. CAT scan of the head didn't show any abnormality.   HOSPITAL COURSE:   *  Multiple ribs fracture due to fall Left 7,8,9,10   Pain is well controlled   She is comfortable without any meds.   Encouraged to use incentive spirometry.    * Fall with dizziness. Orthostatic hypotension.   Infection work ups is negative on arrival.   > 20 mm HG drop in BP on orthostatic vitals. Improved   PT eval- suggest SNF.   May have to wait for insurance approval. Needs compression stockings. Take time from laying down/sitting to standing to prevent falls.  *  Hypertensive   Stable now.   No meds  * CKD stage 3   Dehydration   Improved after iv fluids  * depression    She is taking half dose of citalopram now, per son she is supposed to take half tablet every other day from now onwards as per her doctor.    Decreased to 10 mg every other day.  Stable for discharge to SNF  CONSULTS OBTAINED:    DRUG ALLERGIES:  No Known Allergies  DISCHARGE MEDICATIONS:   Current Discharge Medication List    START taking these medications   Details  acetaminophen (TYLENOL) 325 MG tablet Take 2 tablets (650 mg total) by mouth every 6 (six) hours as needed for mild pain (or Fever >/= 101).      CONTINUE these medications which have CHANGED   Details  citalopram (CELEXA) 10 MG tablet Take 1 tablet (10 mg total) by mouth every other day.      CONTINUE these medications which have NOT CHANGED   Details  aspirin EC 81 MG tablet Take 1 tablet by mouth daily.    busPIRone (BUSPAR) 15 MG tablet Take 1 tablet by mouth daily.    Calcium Carbonate-Vitamin D 600-400 MG-UNIT tablet Take 1 tablet by mouth daily.    donepezil (ARICEPT) 5 MG tablet Take 1 tablet by mouth daily.    levothyroxine (SYNTHROID, LEVOTHROID) 75 MCG tablet Take 1  tablet by mouth daily before breakfast.     Lifitegrast 5 % SOLN Place 1 drop into both eyes 2 (two) times daily.     Multiple Vitamins-Minerals (HAIR/SKIN/NAILS/BIOTIN PO) Take 1 tablet by mouth daily.    Omega-3 Fatty Acids (FISH OIL PO) Take 1 tablet by mouth daily.        Today   VITAL SIGNS:  Blood pressure 140/69, pulse (!) 58, temperature 97.9 F (36.6 C), temperature source Oral, resp. rate 16, height 5' (1.524 m), weight 51.5 kg (113 lb 9.6 oz), SpO2 96 %.  I/O:    Intake/Output Summary (Last 24 hours) at 07/09/16 1046 Last data filed at 07/08/16 1800  Gross per 24 hour  Intake              240 ml  Output                0 ml  Net              240 ml    PHYSICAL EXAMINATION:  Physical  Exam  GENERAL:  83 y.o.-year-old patient lying in the bed with no acute distress.  LUNGS: Normal breath sounds bilaterally, no wheezing, rales,rhonchi or crepitation. No use of accessory muscles of respiration.  CARDIOVASCULAR: S1, S2 normal. No murmurs, rubs, or gallops.  ABDOMEN: Soft, non-tender, non-distended. Bowel sounds present. No organomegaly or mass.  NEUROLOGIC: Moves all 4 extremities. PSYCHIATRIC: The patient is alert and awake SKIN: No obvious rash, lesion, or ulcer.   DATA REVIEW:   CBC  Recent Labs Lab 07/04/16 1705  WBC 7.1  HGB 11.1*  HCT 33.4*  PLT 226    Chemistries   Recent Labs Lab 07/04/16 1705 07/05/16 0601  NA 138 139  K 4.6 4.1  CL 101 107  CO2 30 27  GLUCOSE 96 85  BUN 28* 23*  CREATININE 1.34* 1.10*  CALCIUM 9.5 8.8*  AST 30  --   ALT 21  --   ALKPHOS 61  --   BILITOT 0.6  --     Cardiac Enzymes No results for input(s): TROPONINI in the last 168 hours.  Microbiology Results  No results found for this or any previous visit.  RADIOLOGY:  No results found.  Follow up with PCP in 1 week.  Management plans discussed with the patient, family and they are in agreement.  CODE STATUS:     Code Status Orders        Start     Ordered   07/04/16 2159  Do not attempt resuscitation (DNR)  Continuous    Question Answer Comment  In the event of cardiac or respiratory ARREST Do not call a "code blue"   In the event of cardiac or respiratory ARREST Do not perform Intubation, CPR, defibrillation or ACLS   In the event of cardiac or respiratory ARREST Use medication by any route, position, wound care, and other measures to relive pain and suffering. May use oxygen, suction and manual treatment of airway obstruction as needed for comfort.      07/04/16 2158    Code Status History    Date Active Date Inactive Code Status Order ID Comments User Context   This patient has a current code status but no historical code status.    Advance  Directive Documentation     Most Recent Value  Type of Advance Directive  Healthcare Power of Attorney  Pre-existing out of facility DNR order (yellow form or pink MOST form)  -  "  MOST" Form in Place?  -      TOTAL TIME TAKING CARE OF THIS PATIENT ON DAY OF DISCHARGE: more than 30 minutes.   Milagros LollSudini, Burris Matherne R M.D on 07/09/2016 at 10:46 AM  Between 7am to 6pm - Pager - [phone]  After 6pm go to [email or web address] - password [email or web address]  [facility] [place] [facility]  Office  [phone]  CC: Primary care physician; Lynnea FerrierKlein, Bert J III, MD  Note: This dictation was prepared with Dragon dictation along with smaller phrase technology. Any transcriptional errors that result from this process are unintentional.

## 2016-07-09 DIAGNOSIS — Z9181 History of falling: Secondary | ICD-10-CM | POA: Diagnosis not present

## 2016-07-09 DIAGNOSIS — R296 Repeated falls: Secondary | ICD-10-CM | POA: Diagnosis not present

## 2016-07-09 DIAGNOSIS — I252 Old myocardial infarction: Secondary | ICD-10-CM | POA: Diagnosis not present

## 2016-07-09 DIAGNOSIS — E860 Dehydration: Secondary | ICD-10-CM | POA: Diagnosis not present

## 2016-07-09 DIAGNOSIS — E039 Hypothyroidism, unspecified: Secondary | ICD-10-CM | POA: Diagnosis not present

## 2016-07-09 DIAGNOSIS — K047 Periapical abscess without sinus: Secondary | ICD-10-CM | POA: Diagnosis not present

## 2016-07-09 DIAGNOSIS — I10 Essential (primary) hypertension: Secondary | ICD-10-CM | POA: Diagnosis not present

## 2016-07-09 DIAGNOSIS — R262 Difficulty in walking, not elsewhere classified: Secondary | ICD-10-CM | POA: Diagnosis not present

## 2016-07-09 DIAGNOSIS — S2242XA Multiple fractures of ribs, left side, initial encounter for closed fracture: Secondary | ICD-10-CM | POA: Diagnosis not present

## 2016-07-09 DIAGNOSIS — W19XXXD Unspecified fall, subsequent encounter: Secondary | ICD-10-CM | POA: Diagnosis not present

## 2016-07-09 DIAGNOSIS — S2242XD Multiple fractures of ribs, left side, subsequent encounter for fracture with routine healing: Secondary | ICD-10-CM | POA: Diagnosis not present

## 2016-07-09 DIAGNOSIS — R41841 Cognitive communication deficit: Secondary | ICD-10-CM | POA: Diagnosis not present

## 2016-07-09 DIAGNOSIS — I700 Atherosclerosis of aorta: Secondary | ICD-10-CM | POA: Diagnosis not present

## 2016-07-09 DIAGNOSIS — M6281 Muscle weakness (generalized): Secondary | ICD-10-CM | POA: Diagnosis not present

## 2016-07-09 DIAGNOSIS — S2249XA Multiple fractures of ribs, unspecified side, initial encounter for closed fracture: Secondary | ICD-10-CM | POA: Diagnosis not present

## 2016-07-09 DIAGNOSIS — F321 Major depressive disorder, single episode, moderate: Secondary | ICD-10-CM | POA: Diagnosis not present

## 2016-07-09 DIAGNOSIS — F419 Anxiety disorder, unspecified: Secondary | ICD-10-CM | POA: Diagnosis not present

## 2016-07-09 DIAGNOSIS — N183 Chronic kidney disease, stage 3 (moderate): Secondary | ICD-10-CM | POA: Diagnosis not present

## 2016-07-09 DIAGNOSIS — I951 Orthostatic hypotension: Secondary | ICD-10-CM | POA: Diagnosis not present

## 2016-07-09 DIAGNOSIS — R531 Weakness: Secondary | ICD-10-CM | POA: Diagnosis not present

## 2016-07-09 DIAGNOSIS — R270 Ataxia, unspecified: Secondary | ICD-10-CM | POA: Diagnosis not present

## 2016-07-09 DIAGNOSIS — F0390 Unspecified dementia without behavioral disturbance: Secondary | ICD-10-CM | POA: Diagnosis not present

## 2016-07-09 DIAGNOSIS — I129 Hypertensive chronic kidney disease with stage 1 through stage 4 chronic kidney disease, or unspecified chronic kidney disease: Secondary | ICD-10-CM | POA: Diagnosis not present

## 2016-07-09 DIAGNOSIS — F0280 Dementia in other diseases classified elsewhere without behavioral disturbance: Secondary | ICD-10-CM | POA: Diagnosis not present

## 2016-07-09 DIAGNOSIS — I4430 Unspecified atrioventricular block: Secondary | ICD-10-CM | POA: Diagnosis not present

## 2016-07-09 NOTE — Clinical Social Work Note (Signed)
Pt is ready for discharge today and will go [facility]. Pt and family are aware and agreeable to discharge plan. [name] will provide transportation. RN will call report. CSW is signing off as no further needs identified.   Dede QuerySarah Ariabella Brien, MSW, LCSW  Clinical Socia Worker  [phone]

## 2016-07-09 NOTE — Progress Notes (Signed)
Pt has been discharged, pt's son arrived and [facility], son received paperwork to give to facility. Pt in no distress at d/c.

## 2016-07-09 NOTE — Clinical Social Work Placement (Addendum)
   CLINICAL SOCIAL WORK PLACEMENT  NOTE  Date:  07/09/2016  Patient Details  Name: Carrie Calhoun MRN: [id number] Date of Birth: 08/20/1930  Clinical Social Work is seeking post-discharge placement for this patient at the Skilled  Nursing Facility level of care (*CSW will initial, date and re-position this form in  chart as items are completed):  Yes   Patient/family provided with [facility] Clinical Social Work Department's list of facilities offering this level of care within the geographic area requested by the patient (or if unable, by the patient's family).  Yes   Patient/family informed of their freedom to choose among providers that offer the needed level of care, that participate in Medicare, Medicaid or managed care program needed by the patient, have an available bed and are willing to accept the patient.  Yes   Patient/family informed of [facility]'s ownership interest in [facility] and [facility], as well as of the fact that they are under no obligation to receive care at these facilities.  PASRR submitted to EDS on 07/05/16     PASRR number received on 07/05/16 ( [id number]  )     Existing PASRR number confirmed on       FL2 transmitted to all facilities in geographic area requested by pt/family on 07/05/16     FL2 transmitted to all facilities within larger geographic area on       Patient informed that his/her managed care company has contracts with or will negotiate with certain facilities, including the following:        Yes   Patient/family informed of bed offers received.  [facility]     Physician recommends and patient chooses bed at      [facility] to [facility] on 07/09/16.  Patient to be transferred to facility by [name], Carrie Calhoun     Patient family notified on 07/09/16 of transfer.  Name of family member notified:  [name], Carrie Calhoun     PHYSICIAN       Additional Comment:     _______________________________________________ Dede QuerySarah Burrel Legrand, LCSW 07/09/2016, 10:54 AM

## 2016-07-09 NOTE — Progress Notes (Signed)
Shift assessment completed. Pt initially in bed, stated she had a headache and received tylenol po for this. Pt is on room air, lungs clear bilat, pt is alert and oriented fully. Hr is regular, abdomen is soft, bsheard. Ppp, no edema noted. piv #20 intact to R arm, site free of redness and swelling. Pt has faint yellowish bruise to L shin. Pt also c/o some pain to l jaw from an earlier dental procedure. Pt is using walker to walk to bathroom with supervision prn, denied difficulty voiding, refused stool softener stating she felt it "goes right through her". Call bell in reach.

## 2016-07-09 NOTE — Progress Notes (Signed)
[facility] to [facility], this Clinical research associatewriter removed pt's piv from r fa with catheter intact, pt tolerated well. Per social work, pt's son will [facility] afternoon.

## 2016-07-09 NOTE — Progress Notes (Signed)
Pt remaining alert and oriented through the night. No complaints of pain. Up with assistance to the bathroom. Voiding without difficulty. Pt eating and drinking without difficulty.

## 2016-07-09 NOTE — Plan of Care (Signed)
Problem: Health Behavior/Discharge Planning: Goal: Ability to manage health-related needs will improve Outcome: Adequate for Discharge Pt has met goals for discharge.

## 2016-07-15 DIAGNOSIS — F0390 Unspecified dementia without behavioral disturbance: Secondary | ICD-10-CM | POA: Insufficient documentation

## 2016-07-15 DIAGNOSIS — W19XXXD Unspecified fall, subsequent encounter: Secondary | ICD-10-CM | POA: Diagnosis not present

## 2016-07-15 DIAGNOSIS — K047 Periapical abscess without sinus: Secondary | ICD-10-CM | POA: Diagnosis not present

## 2016-07-19 ENCOUNTER — Non-Acute Institutional Stay (SKILLED_NURSING_FACILITY): Payer: Medicare HMO | Admitting: Gerontology

## 2016-07-19 DIAGNOSIS — S2242XD Multiple fractures of ribs, left side, subsequent encounter for fracture with routine healing: Secondary | ICD-10-CM

## 2016-07-19 DIAGNOSIS — R270 Ataxia, unspecified: Secondary | ICD-10-CM | POA: Diagnosis not present

## 2016-07-21 NOTE — Progress Notes (Addendum)
Location:      Place of Service:  SNF (31)  Provider: Lorenso Quarry, NP-C  PCP: Lynnea Ferrier, MD Patient Care Team: Lynnea Ferrier, MD as PCP - General (Internal Medicine)  Extended Emergency Contact Information Primary Emergency Contact: Combs,Carol [place] [address] [place] Phone: [phone] Relation: Daughter Secondary Emergency Contact: Delma Freeze [place] of [place] Mobile Phone: [phone] Relation: Son  Code Status: FULL Goals of care:  Advanced Directive information Advanced Directives 07/04/2016  Does Patient Have a Medical Advance Directive? Yes  Type of Advance Directive Healthcare Power of Attorney  Does patient want to make changes to medical advance directive? No - Patient declined  Copy of Healthcare Power of Attorney in Chart? No - copy requested     No Known Allergies  Chief Complaint  Patient presents with  . Discharge Note    HPI:  83 y.o. female seen today for discharge evaluation. Pt sustained a mechanical fall at home and sustained multiple Left Rib Fractures. Pt was admitted to the facility for rehab for strengthening. Pt reports she is feeling well, denies pain. She is encouraged by her progress with PT and OT. However, she verbalizes she does not feel she is ready yet to discharge. She feels she is getting stronger but is not strong enough yet. Pt's last covered day per insurance is tomorrow, but family has filed an appeal. Family is arranging for pt to move into Assisted Living. Pt denies n/v/d/f/c/cp/sob/ha/abd pain/dizziness. Reports appetite is fair. Having regular BMs. VSS. No other complaints.       Past Medical History:  Diagnosis Date  . Anxiety   . Atrioventricular block   . Cardiac arrest (HCC)   . CKD (chronic kidney disease) stage 3, GFR 30-59 ml/min   . Hypertension   . Renal disorder     Past Surgical History:  Procedure Laterality Date  . APPENDECTOMY    . CATARACT EXTRACTION    . TUBAL  LIGATION        reports that she has never smoked. She has never used smokeless tobacco. She reports that she does not drink alcohol or use drugs. Social History   Social History  . Marital status: Widowed    Spouse name: N/A  . Number of children: N/A  . Years of education: N/A   Occupational History  . Not on file.   Social History Main Topics  . Smoking status: Never Smoker  . Smokeless tobacco: Never Used  . Alcohol use No  . Drug use: No  . Sexual activity: Not Currently   Other Topics Concern  . Not on file   Social History Narrative  . No narrative on file   Functional Status Survey:    No Known Allergies  There are no preventive care reminders to display for this patient.  Medications: Allergies as of 07/19/2016   No Known Allergies     Medication List       Accurate as of 07/19/16 11:59 PM. Always use your most recent med list.          acetaminophen 325 MG tablet Commonly known as:  TYLENOL Take 2 tablets (650 mg total) by mouth every 6 (six) hours as needed for mild pain (or Fever >/= 101).   aspirin EC 81 MG tablet Take 1 tablet by mouth daily.   busPIRone 15 MG tablet Commonly known as:  BUSPAR Take 1 tablet by mouth daily.   Calcium Carbonate-Vitamin D 600-400 MG-UNIT  tablet Take 1 tablet by mouth daily.   citalopram 10 MG tablet Commonly known as:  CELEXA Take 1 tablet (10 mg total) by mouth every other day.   donepezil 5 MG tablet Commonly known as:  ARICEPT Take 1 tablet by mouth daily.   FISH OIL PO Take 1 tablet by mouth daily.   HAIR/SKIN/NAILS/BIOTIN PO Take 1 tablet by mouth daily.   levothyroxine 75 MCG tablet Commonly known as:  SYNTHROID, LEVOTHROID Take 1 tablet by mouth daily before breakfast.   Lifitegrast 5 % Soln Place 1 drop into both eyes 2 (two) times daily.       Review of Systems  Constitutional: Negative for activity change, appetite change, chills, diaphoresis and fever.  HENT: Negative for  congestion, sneezing, sore throat, trouble swallowing and voice change.   Respiratory: Negative for apnea, cough, choking, chest tightness, shortness of breath and wheezing.   Cardiovascular: Negative for chest pain, palpitations and leg swelling.  Gastrointestinal: Negative for abdominal distention, abdominal pain, constipation, diarrhea and nausea.  Genitourinary: Negative for difficulty urinating, dysuria, frequency and urgency.  Musculoskeletal: Positive for arthralgias (typical arthritis). Negative for back pain, gait problem and myalgias.  Skin: Negative for color change, pallor, rash and wound.  Neurological: Negative for dizziness, tremors, syncope, speech difficulty, weakness, numbness and headaches.  Psychiatric/Behavioral: Negative for agitation and behavioral problems.  All other systems reviewed and are negative.   Vitals:   07/19/16 1700  BP: 137/83  Pulse: 73  Resp: 20  Temp: 97.3 F (36.3 C)  SpO2: 100%   There is no height or weight on file to calculate BMI. Physical Exam  Constitutional: She is oriented to person, place, and time. Vital signs are normal. She appears well-developed and well-nourished. She is active and cooperative. She does not appear ill. No distress.  HENT:  Head: Normocephalic and atraumatic.  Mouth/Throat: Uvula is midline, oropharynx is clear and moist and mucous membranes are normal. Mucous membranes are not pale, not dry and not cyanotic.  Eyes: Conjunctivae, EOM and lids are normal. Pupils are equal, round, and reactive to light.  Neck: Trachea normal, normal range of motion and full passive range of motion without pain. Neck supple. No JVD present. No tracheal deviation, no edema and no erythema present. No thyromegaly present.  Cardiovascular: Normal rate, regular rhythm, normal heart sounds, intact distal pulses and normal pulses.  Exam reveals no gallop, no distant heart sounds and no friction rub.   No murmur heard. Pulmonary/Chest: Effort  normal and breath sounds normal. No accessory muscle usage. No respiratory distress. She has no wheezes. She has no rales. She exhibits no tenderness.  Abdominal: Normal appearance and bowel sounds are normal. She exhibits no distension and no ascites. There is no tenderness.  Musculoskeletal: Normal range of motion. She exhibits no edema or tenderness.  Expected osteoarthritis, stiffness  Neurological: She is alert and oriented to person, place, and time. She has normal strength.  Skin: Skin is warm, dry and intact. No rash noted. She is not diaphoretic. No cyanosis or erythema. No pallor. Nails show no clubbing.  Psychiatric: She has a normal mood and affect. Her speech is normal and behavior is normal. Judgment and thought content normal. Cognition and memory are normal.  Nursing note and vitals reviewed.   Labs reviewed: Basic Metabolic Panel:  Recent Labs  16/10/96 1148 07/04/16 1705 07/05/16 0601  NA 139 138 139  K 4.4 4.6 4.1  CL 104 101 107  CO2 32 30 27  GLUCOSE 99 96 85  BUN 20 28* 23*  CREATININE 1.33* 1.34* 1.10*  CALCIUM 9.8 9.5 8.8*   Liver Function Tests:  Recent Labs  06/14/16 1148 07/04/16 1705  AST 30 30  ALT 23 21  ALKPHOS 64 61  BILITOT 0.5 0.6  PROT 6.6 6.5  ALBUMIN 4.2 3.9   No results for input(s): LIPASE, AMYLASE in the last 8760 hours. No results for input(s): AMMONIA in the last 8760 hours. CBC:  Recent Labs  06/14/16 1148 07/04/16 1705  WBC 6.9 7.1  NEUTROABS  --  5.2  HGB 12.0 11.1*  HCT 36.0 33.4*  MCV 94.9 95.1  PLT 250 226   Cardiac Enzymes:  Recent Labs  06/14/16 1148  TROPONINI <0.03   BNP: Invalid input(s): POCBNP CBG: No results for input(s): GLUCAP in the last 8760 hours.  Procedures and Imaging Studies During Stay: Dg Ribs Unilateral W/chest Left  Result Date: 07/04/2016 CLINICAL DATA:  Multiple falls over the last few days. Left-sided back pain and bruising. EXAM: LEFT RIBS AND CHEST - 3+ VIEW COMPARISON:   06/14/2016 FINDINGS: Chronic enlargement of cardiac silhouette. Chronic aortic atherosclerosis. There is a small left effusion with mild left base atelectasis. Bilateral nipple shadows. Chronic spinal curvature convex to the left. Left rib details show nondisplaced fractures of the left seventh and eighth ribs antro laterally. Minimally displaced fractures of the left ninth and tenth ribs posteriorly. IMPRESSION: Nondisplaced fractures of the left seventh and eighth ribs antero laterally. Minimally displaced fractures of the left ninth and tenth ribs posteriorly. Mild atelectasis at the left base and small effusion probably secondary to that. Electronically Signed   By: Paulina Fusi M.D.   On: 07/04/2016 17:19   Ct Head Wo Contrast  Result Date: 07/04/2016 CLINICAL DATA:  Pt reports having fallen multiple times thought the week and reports having hit head and left side of back. Pt reports feeling as though, "I have hurt myself worse than it seems." Pt was currently on pain medication after having teeth pulled. Headache. EXAM: CT HEAD WITHOUT CONTRAST TECHNIQUE: Contiguous axial images were obtained from the base of the skull through the vertex without intravenous contrast. COMPARISON:  06/14/2016 FINDINGS: Brain: There is central and cortical atrophy. Periventricular white matter changes are consistent with small vessel disease. There is no intra or extra-axial fluid collection or mass lesion. The basilar cisterns and ventricles have a normal appearance. There is no CT evidence for acute infarction or hemorrhage. Vascular: There is atherosclerotic calcification of the carotid siphons. Skull: Normal. Negative for fracture or focal lesion. Sinuses/Orbits: No acute finding. Other: None IMPRESSION: 1.  No evidence for acute  abnormality. 2. Atrophy and small vessel disease. Electronically Signed   By: Norva Pavlov M.D.   On: 07/04/2016 15:06    Assessment/Plan:   1. Ataxia 2. Closed fracture of multiple ribs  of left side with routine healing, subsequent encounter  Continue PT/OT  Continue Acetaminophen prn for pain  TCDB/IS  Splint ribs to cough  Discharge to ALF when bed available  Follow up with PCP asap after discharge for continuity of care   Patient is being discharged with the following home health services:  ALF facility  Patient is being discharged with the following durable medical equipment:    Patient has been advised to f/u with their PCP in 1-2 weeks to bring them up to date on their rehab stay.  Social services at facility was responsible for arranging this appointment.  Pt was provided with  a 30 day supply of prescriptions for medications and refills must be obtained from their PCP.  For controlled substances, a more limited supply may be provided adequate until PCP appointment only.  Future labs/tests needed:  Per pcp  Family/ staff Communication:   Total Time:  Documentation:  Face to Face:  Family/Phone:  Brynda RimShannon H. Zion Ta, NP-C Geriatrics [facility] [address] [place] [place], [place] [address] Cell Phone (Mon-Fri 8am-5pm):  [phone] On Call:  [phone] & follow prompts after 5pm & weekends Office Phone:  [phone] Office Fax:  [phone]

## 2016-07-22 DIAGNOSIS — R270 Ataxia, unspecified: Secondary | ICD-10-CM | POA: Insufficient documentation

## 2016-07-24 ENCOUNTER — Non-Acute Institutional Stay (SKILLED_NURSING_FACILITY): Payer: Medicare HMO | Admitting: Gerontology

## 2016-07-24 DIAGNOSIS — R270 Ataxia, unspecified: Secondary | ICD-10-CM

## 2016-07-24 DIAGNOSIS — S2242XD Multiple fractures of ribs, left side, subsequent encounter for fracture with routine healing: Secondary | ICD-10-CM | POA: Diagnosis not present

## 2016-07-24 NOTE — Progress Notes (Signed)
Location:      Place of Service:  SNF (31)  Provider: Lorenso QuarryShannon Makaio Mach, NP-C  PCP: Lynnea FerrierKlein, Bert J III, MD Patient Care Team: Lynnea FerrierKlein, Bert J III, MD as PCP - General (Internal Medicine)  Extended Emergency Contact Information Primary Emergency Contact: Combs,Carol [place] [id number] [place] Mobile Phone: [phone] Relation: Daughter Secondary Emergency Contact: Delma FreezeFaucette,John  [place] Mobile Phone: [phone] Relation: Son  Code Status: FULL Goals of care:  Advanced Directive information Advanced Directives 07/04/2016  Does Patient Have a Medical Advance Directive? Yes  Type of Advance Directive Healthcare Power of Attorney  Does patient want to make changes to medical advance directive? No - Patient declined  Copy of Healthcare Power of Attorney in Chart? No - copy requested     No Known Allergies  Chief Complaint  Patient presents with  . Discharge Note    HPI:  83 y.o. female seen today for discharge evaluation. Pt was supposed to discharge this past Saturday. However, pt reported she did not feel she was ready for discharge. Family filed appeal with [employer] for more therapy. Pt sustained a mechanical fall at home and sustained multiple Left Rib Fractures. Pt was admitted to the facility for rehab for strengthening. Pt reports she is feeling well, denies pain. She is encouraged by her progress with PT and OT.  Family is arranging for pt to move into Assisted Living today. Pt denies n/v/d/f/c/cp/sob/ha/abd pain/dizziness. Reports appetite is fair. Having regular BMs. VSS. No other complaints.       Past Medical History:  Diagnosis Date  . Anxiety   . Atrioventricular block   . Cardiac arrest (HCC)   . CKD (chronic kidney disease) stage 3, GFR 30-59 ml/min   . Hypertension   . Renal disorder     Past Surgical History:  Procedure Laterality Date  . APPENDECTOMY    . CATARACT EXTRACTION    . TUBAL LIGATION        reports that  she has never smoked. She has never used smokeless tobacco. She reports that she does not drink alcohol or use drugs. Social History   Social History  . Marital status: Widowed    Spouse name: N/A  . Number of children: N/A  . Years of education: N/A   Occupational History  . Not on file.   Social History Main Topics  . Smoking status: Never Smoker  . Smokeless tobacco: Never Used  . Alcohol use No  . Drug use: No  . Sexual activity: Not Currently   Other Topics Concern  . Not on file   Social History Narrative  . No narrative on file   Functional Status Survey:    No Known Allergies  Pertinent  Health Maintenance Due  Topic Date Due  . DEXA SCAN  11/23/1997  . PNA vac Low Risk Adult (1 of 2 - PCV13) 11/23/1997  . INFLUENZA VACCINE  10/02/2016    Medications: Allergies as of 07/24/2016   No Known Allergies     Medication List       Accurate as of 07/24/16 12:00 PM. Always use your most recent med list.          acetaminophen 325 MG tablet Commonly known as:  TYLENOL Take 2 tablets (650 mg total) by mouth every 6 (six) hours as needed for mild pain (or Fever >/= 101).   aspirin EC 81 MG tablet Take 1 tablet by mouth daily.   busPIRone 15 MG tablet Commonly  known as:  BUSPAR Take 1 tablet by mouth daily.   Calcium Carbonate-Vitamin D 600-400 MG-UNIT tablet Take 1 tablet by mouth daily.   citalopram 10 MG tablet Commonly known as:  CELEXA Take 1 tablet (10 mg total) by mouth every other day.   donepezil 5 MG tablet Commonly known as:  ARICEPT Take 1 tablet by mouth daily.   FISH OIL PO Take 1 tablet by mouth daily.   HAIR/SKIN/NAILS/BIOTIN PO Take 1 tablet by mouth daily.   levothyroxine 75 MCG tablet Commonly known as:  SYNTHROID, LEVOTHROID Take 1 tablet by mouth daily before breakfast.   Lifitegrast 5 % Soln Place 1 drop into both eyes 2 (two) times daily.       Review of Systems  Constitutional: Negative for activity change,  appetite change, chills, diaphoresis and fever.  HENT: Negative for congestion, sneezing, sore throat, trouble swallowing and voice change.   Respiratory: Negative for apnea, cough, choking, chest tightness, shortness of breath and wheezing.   Cardiovascular: Negative for chest pain, palpitations and leg swelling.  Gastrointestinal: Negative for abdominal distention, abdominal pain, constipation, diarrhea and nausea.  Genitourinary: Negative for difficulty urinating, dysuria, frequency and urgency.  Musculoskeletal: Positive for arthralgias (typical arthritis). Negative for back pain, gait problem and myalgias.  Skin: Negative for color change, pallor, rash and wound.  Neurological: Negative for dizziness, tremors, syncope, speech difficulty, weakness, numbness and headaches.  Psychiatric/Behavioral: Negative for agitation and behavioral problems.  All other systems reviewed and are negative.   Vitals:   07/24/16 0530  BP: 136/77  Pulse: 84  Resp: 18  Temp: 98.1 F (36.7 C)  SpO2: 98%   There is no height or weight on file to calculate BMI. Physical Exam  Constitutional: She is oriented to person, place, and time. Vital signs are normal. She appears well-developed and well-nourished. She is active and cooperative. She does not appear ill. No distress.  HENT:  Head: Normocephalic and atraumatic.  Mouth/Throat: Uvula is midline, oropharynx is clear and moist and mucous membranes are normal. Mucous membranes are not pale, not dry and not cyanotic.  Eyes: Conjunctivae, EOM and lids are normal. Pupils are equal, round, and reactive to light.  Neck: Trachea normal, normal range of motion and full passive range of motion without pain. Neck supple. No JVD present. No tracheal deviation, no edema and no erythema present. No thyromegaly present.  Cardiovascular: Normal rate, regular rhythm, normal heart sounds, intact distal pulses and normal pulses.  Exam reveals no gallop, no distant heart  sounds and no friction rub.   No murmur heard. Pulmonary/Chest: Effort normal and breath sounds normal. No accessory muscle usage. No respiratory distress. She has no wheezes. She has no rales. She exhibits no tenderness.  Abdominal: Normal appearance and bowel sounds are normal. She exhibits no distension and no ascites. There is no tenderness.  Musculoskeletal: Normal range of motion. She exhibits no edema or tenderness.  Expected osteoarthritis, stiffness  Neurological: She is alert and oriented to person, place, and time. She has normal strength.  Skin: Skin is warm, dry and intact. No rash noted. She is not diaphoretic. No cyanosis or erythema. No pallor. Nails show no clubbing.  Psychiatric: She has a normal mood and affect. Her speech is normal and behavior is normal. Judgment and thought content normal. Cognition and memory are normal.  Nursing note and vitals reviewed.   Labs reviewed: Basic Metabolic Panel:  Recent Labs  19/14/78 1148 07/04/16 1705 07/05/16 0601  NA 139 138  139  K 4.4 4.6 4.1  CL 104 101 107  CO2 32 30 27  GLUCOSE 99 96 85  BUN 20 28* 23*  CREATININE 1.33* 1.34* 1.10*  CALCIUM 9.8 9.5 8.8*   Liver Function Tests:  Recent Labs  06/14/16 1148 07/04/16 1705  AST 30 30  ALT 23 21  ALKPHOS 64 61  BILITOT 0.5 0.6  PROT 6.6 6.5  ALBUMIN 4.2 3.9   No results for input(s): LIPASE, AMYLASE in the last 8760 hours. No results for input(s): AMMONIA in the last 8760 hours. CBC:  Recent Labs  06/14/16 1148 07/04/16 1705  WBC 6.9 7.1  NEUTROABS  --  5.2  HGB 12.0 11.1*  HCT 36.0 33.4*  MCV 94.9 95.1  PLT 250 226   Cardiac Enzymes:  Recent Labs  06/14/16 1148  TROPONINI <0.03   BNP: Invalid input(s): POCBNP CBG: No results for input(s): GLUCAP in the last 8760 hours.  Procedures and Imaging Studies During Stay: Dg Ribs Unilateral W/chest Left  Result Date: 07/04/2016 CLINICAL DATA:  Multiple falls over the last few days. Left-sided back  pain and bruising. EXAM: LEFT RIBS AND CHEST - 3+ VIEW COMPARISON:  06/14/2016 FINDINGS: Chronic enlargement of cardiac silhouette. Chronic aortic atherosclerosis. There is a small left effusion with mild left base atelectasis. Bilateral nipple shadows. Chronic spinal curvature convex to the left. Left rib details show nondisplaced fractures of the left seventh and eighth ribs antro laterally. Minimally displaced fractures of the left ninth and tenth ribs posteriorly. IMPRESSION: Nondisplaced fractures of the left seventh and eighth ribs antero laterally. Minimally displaced fractures of the left ninth and tenth ribs posteriorly. Mild atelectasis at the left base and small effusion probably secondary to that. Electronically Signed   By: Paulina Fusi M.D.   On: 07/04/2016 17:19   Ct Head Wo Contrast  Result Date: 07/04/2016 CLINICAL DATA:  Pt reports having fallen multiple times thought the week and reports having hit head and left side of back. Pt reports feeling as though, "I have hurt myself worse than it seems." Pt was currently on pain medication after having teeth pulled. Headache. EXAM: CT HEAD WITHOUT CONTRAST TECHNIQUE: Contiguous axial images were obtained from the base of the skull through the vertex without intravenous contrast. COMPARISON:  06/14/2016 FINDINGS: Brain: There is central and cortical atrophy. Periventricular white matter changes are consistent with small vessel disease. There is no intra or extra-axial fluid collection or mass lesion. The basilar cisterns and ventricles have a normal appearance. There is no CT evidence for acute infarction or hemorrhage. Vascular: There is atherosclerotic calcification of the carotid siphons. Skull: Normal. Negative for fracture or focal lesion. Sinuses/Orbits: No acute finding. Other: None IMPRESSION: 1.  No evidence for acute  abnormality. 2. Atrophy and small vessel disease. Electronically Signed   By: Norva Pavlov M.D.   On: 07/04/2016 15:06     Assessment/Plan:   1. Ataxia 2. Closed fracture of multiple ribs of left side with routine healing, subsequent encounter  Continue PT/OT  Continue Acetaminophen prn for pain  TCDB/IS  Splint ribs to cough  Discharge to ALF when bed available  Follow up with PCP asap after discharge for continuity of care   Patient is being discharged with the following home health services:  ALF facility  Patient is being discharged with the following durable medical equipment:    Patient has been advised to f/u with their PCP in 1-2 weeks to bring them up to date on their rehab  stay.  Social services at facility was responsible for arranging this appointment.  Pt was provided with a 30 day supply of prescriptions for medications and refills must be obtained from their PCP.  For controlled substances, a more limited supply may be provided adequate until PCP appointment only.  Future labs/tests needed:  Per pcp  Family/ staff Communication:   Total Time:  Documentation:  Face to Face:  Family/Phone:  Brynda Rim, NP-C Geriatrics [facility] [address], [place] [address] Cell Phone (Mon-Fri 8am-5pm):  [phone] On Call:  [phone] & follow prompts after 5pm & weekends Office Phone:  [phone] Office Fax:  [phone]

## 2016-08-11 ENCOUNTER — Encounter: Payer: Self-pay | Admitting: *Deleted

## 2016-08-11 ENCOUNTER — Emergency Department: Payer: Medicare HMO

## 2016-08-11 ENCOUNTER — Emergency Department
Admission: EM | Admit: 2016-08-11 | Discharge: 2016-08-11 | Disposition: A | Discharge status: Home / Self Care | Payer: Medicare HMO | Attending: Emergency Medicine | Admitting: Emergency Medicine

## 2016-08-11 DIAGNOSIS — S2242XA Multiple fractures of ribs, left side, initial encounter for closed fracture: Secondary | ICD-10-CM | POA: Diagnosis not present

## 2016-08-11 DIAGNOSIS — M25561 Pain in right knee: Secondary | ICD-10-CM | POA: Diagnosis not present

## 2016-08-11 DIAGNOSIS — R251 Tremor, unspecified: Secondary | ICD-10-CM | POA: Diagnosis present

## 2016-08-11 DIAGNOSIS — W19XXXA Unspecified fall, initial encounter: Secondary | ICD-10-CM

## 2016-08-11 DIAGNOSIS — R569 Unspecified convulsions: Secondary | ICD-10-CM | POA: Diagnosis not present

## 2016-08-11 DIAGNOSIS — I10 Essential (primary) hypertension: Secondary | ICD-10-CM | POA: Diagnosis not present

## 2016-08-11 DIAGNOSIS — Y999 Unspecified external cause status: Secondary | ICD-10-CM | POA: Insufficient documentation

## 2016-08-11 DIAGNOSIS — Y939 Activity, unspecified: Secondary | ICD-10-CM | POA: Insufficient documentation

## 2016-08-11 DIAGNOSIS — Y929 Unspecified place or not applicable: Secondary | ICD-10-CM | POA: Diagnosis not present

## 2016-08-11 DIAGNOSIS — S0990XA Unspecified injury of head, initial encounter: Secondary | ICD-10-CM | POA: Diagnosis not present

## 2016-08-11 DIAGNOSIS — W1830XA Fall on same level, unspecified, initial encounter: Secondary | ICD-10-CM | POA: Insufficient documentation

## 2016-08-11 DIAGNOSIS — G459 Transient cerebral ischemic attack, unspecified: Secondary | ICD-10-CM

## 2016-08-11 LAB — URINALYSIS, COMPLETE (UACMP) WITH MICROSCOPIC
BILIRUBIN URINE: NEGATIVE
Bacteria, UA: NONE SEEN
Glucose, UA: NEGATIVE mg/dL
HGB URINE DIPSTICK: NEGATIVE
Ketones, ur: NEGATIVE mg/dL
Leukocytes, UA: NEGATIVE
NITRITE: NEGATIVE
PROTEIN: NEGATIVE mg/dL
Specific Gravity, Urine: 1.008 (ref 1.005–1.030)
Squamous Epithelial / LPF: NONE SEEN
pH: 7 (ref 5.0–8.0)

## 2016-08-11 LAB — CBC
HCT: 36 % (ref 35.0–47.0)
HEMOGLOBIN: 12.1 g/dL (ref 12.0–16.0)
MCH: 31.5 pg (ref 26.0–34.0)
MCHC: 33.7 g/dL (ref 32.0–36.0)
MCV: 93.6 fL (ref 80.0–100.0)
Platelets: 252 10*3/uL (ref 150–440)
RBC: 3.85 MIL/uL (ref 3.80–5.20)
RDW: 13.3 % (ref 11.5–14.5)
WBC: 7.1 10*3/uL (ref 3.6–11.0)

## 2016-08-11 LAB — BASIC METABOLIC PANEL
Anion gap: 9 (ref 5–15)
BUN: 32 mg/dL — ABNORMAL HIGH (ref 6–20)
CALCIUM: 9.7 mg/dL (ref 8.9–10.3)
CO2: 29 mmol/L (ref 22–32)
Chloride: 100 mmol/L — ABNORMAL LOW (ref 101–111)
Creatinine, Ser: 1.25 mg/dL — ABNORMAL HIGH (ref 0.44–1.00)
GFR, EST AFRICAN AMERICAN: 45 mL/min — AB (ref >60)
GFR, EST NON AFRICAN AMERICAN: 39 mL/min — AB (ref >60)
Glucose, Bld: 72 mg/dL (ref 65–99)
Potassium: 4.3 mmol/L (ref 3.5–5.1)
Sodium: 138 mmol/L (ref 135–145)

## 2016-08-11 NOTE — ED Notes (Signed)
Pt transported to CT ?

## 2016-08-11 NOTE — ED Triage Notes (Signed)
Pt [facility] with family[facility] states pt has been "jerking" so much that it has caused falls, states tremors in her hands, tremors noticed in hands but pt sitting in wheel chair in no distress, states right knee pain from a fall from the jerking

## 2016-08-11 NOTE — ED Provider Notes (Signed)
Patient was seizure-like activity versus TIA this morning. Back to baseline at the time of signout. Signed out from Dr. Lamont Snowballifenbark is to follow-up with the patient's MRI and if there is no acute finding to discharge the patient home.  Physical Exam  BP (!) 175/76 (BP Location: Left Arm)   Pulse (!) 58   Temp 98.2 F (36.8 C)   Resp 16   Ht 5' (1.524 m)   Wt 48.1 kg (106 lb)   SpO2 100%   BMI 20.70 kg/m  ----------------------------------------- 5:35 PM on 08/11/2016 -----------------------------------------   Physical Exam Patient and her baseline mental and neurologic status. Son is at the bedside and confirms this. ED Course  Procedures  MDM MRI without acute finding. Patient will be discharged home. Discussed with the family as well as the patient that she will need to follow-up with the neurologist. They'll be calling tomorrow to follow-up soon as possible for further testing, likely EEG. They're understanding the plan and willing to comply. Will be discharged home.       Myrna BlazerSchaevitz, David Matthew, MD 08/11/16 [phone]

## 2016-08-11 NOTE — ED Notes (Signed)
Pt family states that around 0900 this morning pt has period of "jerking". During that time pt had difficulty starting and finishing sentences.  Pt is also having right knee pain

## 2016-08-11 NOTE — ED Provider Notes (Addendum)
[facility] Emergency Department Provider Note  ____________________________________________   First MD Initiated Contact with Patient 08/11/16 1419     (approximate)  I have reviewed the triage vital signs and the nursing notes.   HISTORY  Chief Complaint Tremors and Fall  History obtained from the patient and also her son at bedside  HPI Carrie Calhoun is a 83 y.o. female who comes to the emergency department after an episode of shaking that happened around 9 AM today roughly 3 hours prior to arrival. According to the son the patient was sitting at a table having breakfast with her arms began to jerk back and forth as well as her legs and she had some slurred and broken speech and she slowly fell to the floor. She got back up shortly thereafter and has been well ever since. She has a history of hypertension. She's never had a heart attack or stroke before. She does have some mild aching discomfort in her right knee where she fell to the ground. She has been able to ambulate since. She has no chest pain or shortness of breath at this time.   Past Medical History:  Diagnosis Date  . Anxiety   . Atrioventricular block   . Cardiac arrest (HCC)   . CKD (chronic kidney disease) stage 3, GFR 30-59 ml/min   . Hypertension   . Renal disorder     Patient Active Problem List   Diagnosis Date Noted  . Ataxia 07/22/2016  . Multiple rib fractures 07/04/2016  . Moderate major depression, single episode (HCC) 06/14/2016    Past Surgical History:  Procedure Laterality Date  . APPENDECTOMY    . CATARACT EXTRACTION    . TUBAL LIGATION      Prior to Admission medications   Medication Sig Start Date End Date Taking? Authorizing Provider  acetaminophen (TYLENOL) 325 MG tablet Take 2 tablets (650 mg total) by mouth every 6 (six) hours as needed for mild pain (or Fever >/= 101). 07/08/16   Milagros LollSudini, Srikar, MD  aspirin EC 81 MG tablet Take 1 tablet by mouth daily.     [provider]  busPIRone (BUSPAR) 15 MG tablet Take 1 tablet by mouth daily. 05/13/16   [provider]  Calcium Carbonate-Vitamin D 600-400 MG-UNIT tablet Take 1 tablet by mouth daily.    [provider]  citalopram (CELEXA) 10 MG tablet Take 1 tablet (10 mg total) by mouth every other day. 07/08/16   Milagros LollSudini, Srikar, MD  donepezil (ARICEPT) 5 MG tablet Take 1 tablet by mouth daily. 05/13/16   [provider]  levothyroxine (SYNTHROID, LEVOTHROID) 75 MCG tablet Take 1 tablet by mouth daily before breakfast.  03/11/16   [provider]  Lifitegrast 5 % SOLN Place 1 drop into both eyes 2 (two) times daily.     [provider]  Multiple Vitamins-Minerals (HAIR/SKIN/NAILS/BIOTIN PO) Take 1 tablet by mouth daily.    [provider]  Omega-3 Fatty Acids (FISH OIL PO) Take 1 tablet by mouth daily.    [provider]    Allergies Patient has no known allergies.  Family History  Problem Relation Age of Onset  . Hypertension Mother   . Stroke Mother   . Breast cancer Maternal Aunt     Social History Social History  Substance Use Topics  . Smoking status: Never Smoker  . Smokeless tobacco: Never Used  . Alcohol use No    Review of Systems Constitutional: No fever/chills Eyes:  No visual changes. ENT: No sore throat. Cardiovascular: Denies chest pain. Respiratory: Denies shortness of breath. Gastrointestinal: No abdominal pain.  No nausea, no vomiting.  No diarrhea.  No constipation. Genitourinary: Negative for dysuria. Musculoskeletal: Negative for back pain. Skin: Negative for rash. Neurological: Negative for headache.   ____________________________________________   PHYSICAL EXAM:  VITAL SIGNS: ED Triage Vitals  Enc Vitals Group     BP 08/11/16 1156 (!) 149/66     Pulse Rate 08/11/16 1156 62     Resp 08/11/16 1156 16     Temp 08/11/16 1156 98.2 F (36.8 C)     Temp src --      SpO2 08/11/16 1156 100 %      Weight 08/11/16 1152 106 lb (48.1 kg)     Height 08/11/16 1152 5' (1.524 m)     Head Circumference --      Peak Flow --      Pain Score --      Pain Loc --      Pain Edu? --      Excl. in GC? --     Constitutional: Alert and oriented x 4 well appearing nontoxic no diaphoresis speaks in full, clear sentences Eyes: PERRL EOMI. Head: Atraumatic. Nose: No congestion/rhinnorhea. Mouth/Throat: No trismusNo bites to the tongue Neck: No stridor.   Cardiovascular: Normal rate, regular rhythm. Grossly normal heart sounds.  Good peripheral circulation. Respiratory: Normal respiratory effort.  No retractions. Lungs CTAB and moving good air Gastrointestinal: Soft nontender Musculoskeletal:Right knee no tenderness extensor mechanism intact the grossly stable Neurologic:  Normal speech Cranial nerves II through XII intact No pronator drift 5 out of 5 grips biceps triceps hip extension and hip flexion and plantar flexion dorsiflexion normal finger-nose-finger sensation intact to light touch throughout 2+ DTRs no ankle clonus Skin:  Skin is warm, dry and intact. No rash noted. Psychiatric: Mood and affect are normal. Speech and behavior are normal.    ____________________________________________   DIFFERENTIAL  Stroke, TIA, seizure, dysrhythmia, intracerebral hemorrhage, intracerebral mass   LABS (all labs ordered are listed, but only abnormal results are displayed)  Labs Reviewed  BASIC METABOLIC PANEL - Abnormal; Notable for the following:       Result Value   Chloride 100 (*)    BUN 32 (*)    Creatinine, Ser 1.25 (*)    GFR calc non Af Amer 39 (*)    GFR calc Af Amer 45 (*)    All other components within normal limits  URINALYSIS, COMPLETE (UACMP) WITH MICROSCOPIC - Abnormal; Notable for the following:    Color, Urine YELLOW (*)    APPearance CLEAR (*)    All other components within normal limits  CBC  CBG MONITORING, ED  No signs of urinary tract infection blood work  unremarkable __________________________________________  EKG  ED ECG REPORT I, Merrily Brittle, the attending physician, personally viewed and interpreted this ECG.  Date: 08/11/2016 EKG Time:  Rate: 48 Rhythm: Sinus bradycardia QRS Axis: normal Intervals: First-degree AV block ST/T Wave abnormalities: normal Narrative Interpretation: unremarkable  ____________________________________________  RADIOLOGY  Pending MRI ____________________________________________   PROCEDURES  Procedure(s) performed: no  Procedures  Critical Care performed: no  Observation: no ____________________________________________   INITIAL IMPRESSION / ASSESSMENT AND PLAN / ED COURSE  Pertinent labs & imaging results that were available during my care of the patient were reviewed by me and considered in my medical decision making (see chart for details).  On arrival the patient is very well-appearing with  a normal neurological exam. It's unclear what exactly happened today and she may have had a seizure versus a transient ischemic attack. I will touch base with neurology now for further recommendations.     I discussed the case with Dr. Thad Ranger on call for neurology who recommended a TIA workup with an MRI of the brain to evaluate. Prevent discussed the case with the hospitalist Dr. Judithann Sheen. Together we agreed that the patient could receive the MRI in the emergency department and that if the MRI were positive she would require inpatient admission, however if it were negative she would be stable for discharge for the remainder of her TIA workup. ____________________________________________   FINAL CLINICAL IMPRESSION(S) / ED DIAGNOSES  Final diagnoses:  Fall, initial encounter      NEW MEDICATIONS STARTED DURING THIS VISIT:  New Prescriptions   No medications on file     Note:  This document was prepared using Dragon voice recognition software and may include unintentional dictation  errors.     Merrily Brittle, MD 08/11/16 1610    Merrily Brittle, MD 08/27/16 1225

## 2016-08-12 DIAGNOSIS — R251 Tremor, unspecified: Secondary | ICD-10-CM | POA: Diagnosis not present

## 2016-08-12 DIAGNOSIS — I10 Essential (primary) hypertension: Secondary | ICD-10-CM | POA: Diagnosis not present

## 2016-08-12 DIAGNOSIS — R202 Paresthesia of skin: Secondary | ICD-10-CM | POA: Diagnosis not present

## 2016-08-12 DIAGNOSIS — R413 Other amnesia: Secondary | ICD-10-CM | POA: Diagnosis not present

## 2016-08-12 DIAGNOSIS — G5603 Carpal tunnel syndrome, bilateral upper limbs: Secondary | ICD-10-CM | POA: Diagnosis not present

## 2016-08-12 DIAGNOSIS — G2581 Restless legs syndrome: Secondary | ICD-10-CM | POA: Diagnosis not present

## 2016-08-12 DIAGNOSIS — R262 Difficulty in walking, not elsewhere classified: Secondary | ICD-10-CM | POA: Diagnosis not present

## 2016-08-13 DIAGNOSIS — W19XXXD Unspecified fall, subsequent encounter: Secondary | ICD-10-CM | POA: Diagnosis not present

## 2016-08-13 DIAGNOSIS — I951 Orthostatic hypotension: Secondary | ICD-10-CM | POA: Diagnosis not present

## 2016-08-13 DIAGNOSIS — S2242XD Multiple fractures of ribs, left side, subsequent encounter for fracture with routine healing: Secondary | ICD-10-CM | POA: Diagnosis not present

## 2016-08-13 DIAGNOSIS — Z7982 Long term (current) use of aspirin: Secondary | ICD-10-CM | POA: Diagnosis not present

## 2016-08-13 DIAGNOSIS — R262 Difficulty in walking, not elsewhere classified: Secondary | ICD-10-CM | POA: Diagnosis not present

## 2016-08-13 DIAGNOSIS — M6281 Muscle weakness (generalized): Secondary | ICD-10-CM | POA: Diagnosis not present

## 2016-08-14 DIAGNOSIS — N183 Chronic kidney disease, stage 3 (moderate): Secondary | ICD-10-CM | POA: Diagnosis not present

## 2016-08-14 DIAGNOSIS — D649 Anemia, unspecified: Secondary | ICD-10-CM | POA: Diagnosis not present

## 2016-08-14 DIAGNOSIS — Z7982 Long term (current) use of aspirin: Secondary | ICD-10-CM | POA: Diagnosis not present

## 2016-08-14 DIAGNOSIS — R262 Difficulty in walking, not elsewhere classified: Secondary | ICD-10-CM | POA: Diagnosis not present

## 2016-08-14 DIAGNOSIS — W19XXXD Unspecified fall, subsequent encounter: Secondary | ICD-10-CM | POA: Diagnosis not present

## 2016-08-14 DIAGNOSIS — S2242XD Multiple fractures of ribs, left side, subsequent encounter for fracture with routine healing: Secondary | ICD-10-CM | POA: Diagnosis not present

## 2016-08-14 DIAGNOSIS — I10 Essential (primary) hypertension: Secondary | ICD-10-CM | POA: Diagnosis not present

## 2016-08-14 DIAGNOSIS — M6281 Muscle weakness (generalized): Secondary | ICD-10-CM | POA: Diagnosis not present

## 2016-08-14 DIAGNOSIS — I951 Orthostatic hypotension: Secondary | ICD-10-CM | POA: Diagnosis not present

## 2016-08-15 DIAGNOSIS — I951 Orthostatic hypotension: Secondary | ICD-10-CM | POA: Diagnosis not present

## 2016-08-15 DIAGNOSIS — M6281 Muscle weakness (generalized): Secondary | ICD-10-CM | POA: Diagnosis not present

## 2016-08-15 DIAGNOSIS — Z7982 Long term (current) use of aspirin: Secondary | ICD-10-CM | POA: Diagnosis not present

## 2016-08-15 DIAGNOSIS — W19XXXD Unspecified fall, subsequent encounter: Secondary | ICD-10-CM | POA: Diagnosis not present

## 2016-08-15 DIAGNOSIS — S2242XD Multiple fractures of ribs, left side, subsequent encounter for fracture with routine healing: Secondary | ICD-10-CM | POA: Diagnosis not present

## 2016-08-15 DIAGNOSIS — R262 Difficulty in walking, not elsewhere classified: Secondary | ICD-10-CM | POA: Diagnosis not present

## 2016-08-19 DIAGNOSIS — W19XXXD Unspecified fall, subsequent encounter: Secondary | ICD-10-CM | POA: Diagnosis not present

## 2016-08-19 DIAGNOSIS — Z7982 Long term (current) use of aspirin: Secondary | ICD-10-CM | POA: Diagnosis not present

## 2016-08-19 DIAGNOSIS — S2242XD Multiple fractures of ribs, left side, subsequent encounter for fracture with routine healing: Secondary | ICD-10-CM | POA: Diagnosis not present

## 2016-08-19 DIAGNOSIS — R262 Difficulty in walking, not elsewhere classified: Secondary | ICD-10-CM | POA: Diagnosis not present

## 2016-08-19 DIAGNOSIS — I951 Orthostatic hypotension: Secondary | ICD-10-CM | POA: Diagnosis not present

## 2016-08-19 DIAGNOSIS — M6281 Muscle weakness (generalized): Secondary | ICD-10-CM | POA: Diagnosis not present

## 2016-08-21 DIAGNOSIS — R262 Difficulty in walking, not elsewhere classified: Secondary | ICD-10-CM | POA: Diagnosis not present

## 2016-08-21 DIAGNOSIS — Z0000 Encounter for general adult medical examination without abnormal findings: Secondary | ICD-10-CM | POA: Diagnosis not present

## 2016-08-21 DIAGNOSIS — M6281 Muscle weakness (generalized): Secondary | ICD-10-CM | POA: Diagnosis not present

## 2016-08-21 DIAGNOSIS — Z7982 Long term (current) use of aspirin: Secondary | ICD-10-CM | POA: Diagnosis not present

## 2016-08-21 DIAGNOSIS — F0390 Unspecified dementia without behavioral disturbance: Secondary | ICD-10-CM | POA: Diagnosis not present

## 2016-08-21 DIAGNOSIS — S2242XD Multiple fractures of ribs, left side, subsequent encounter for fracture with routine healing: Secondary | ICD-10-CM | POA: Diagnosis not present

## 2016-08-21 DIAGNOSIS — I10 Essential (primary) hypertension: Secondary | ICD-10-CM | POA: Diagnosis not present

## 2016-08-21 DIAGNOSIS — E034 Atrophy of thyroid (acquired): Secondary | ICD-10-CM | POA: Diagnosis not present

## 2016-08-21 DIAGNOSIS — N183 Chronic kidney disease, stage 3 (moderate): Secondary | ICD-10-CM | POA: Diagnosis not present

## 2016-08-21 DIAGNOSIS — M8589 Other specified disorders of bone density and structure, multiple sites: Secondary | ICD-10-CM | POA: Diagnosis not present

## 2016-08-21 DIAGNOSIS — E784 Other hyperlipidemia: Secondary | ICD-10-CM | POA: Diagnosis not present

## 2016-08-21 DIAGNOSIS — I951 Orthostatic hypotension: Secondary | ICD-10-CM | POA: Diagnosis not present

## 2016-08-21 DIAGNOSIS — D649 Anemia, unspecified: Secondary | ICD-10-CM | POA: Diagnosis not present

## 2016-08-21 DIAGNOSIS — W19XXXD Unspecified fall, subsequent encounter: Secondary | ICD-10-CM | POA: Diagnosis not present

## 2016-08-21 DIAGNOSIS — M5136 Other intervertebral disc degeneration, lumbar region: Secondary | ICD-10-CM | POA: Diagnosis not present

## 2016-08-22 DIAGNOSIS — I951 Orthostatic hypotension: Secondary | ICD-10-CM | POA: Diagnosis not present

## 2016-08-22 DIAGNOSIS — R262 Difficulty in walking, not elsewhere classified: Secondary | ICD-10-CM | POA: Diagnosis not present

## 2016-08-22 DIAGNOSIS — W19XXXD Unspecified fall, subsequent encounter: Secondary | ICD-10-CM | POA: Diagnosis not present

## 2016-08-22 DIAGNOSIS — M6281 Muscle weakness (generalized): Secondary | ICD-10-CM | POA: Diagnosis not present

## 2016-08-22 DIAGNOSIS — Z7982 Long term (current) use of aspirin: Secondary | ICD-10-CM | POA: Diagnosis not present

## 2016-08-22 DIAGNOSIS — S2242XD Multiple fractures of ribs, left side, subsequent encounter for fracture with routine healing: Secondary | ICD-10-CM | POA: Diagnosis not present

## 2016-08-26 DIAGNOSIS — Z7982 Long term (current) use of aspirin: Secondary | ICD-10-CM | POA: Diagnosis not present

## 2016-08-26 DIAGNOSIS — W19XXXD Unspecified fall, subsequent encounter: Secondary | ICD-10-CM | POA: Diagnosis not present

## 2016-08-26 DIAGNOSIS — I951 Orthostatic hypotension: Secondary | ICD-10-CM | POA: Diagnosis not present

## 2016-08-26 DIAGNOSIS — S2242XD Multiple fractures of ribs, left side, subsequent encounter for fracture with routine healing: Secondary | ICD-10-CM | POA: Diagnosis not present

## 2016-08-26 DIAGNOSIS — R262 Difficulty in walking, not elsewhere classified: Secondary | ICD-10-CM | POA: Diagnosis not present

## 2016-08-26 DIAGNOSIS — M6281 Muscle weakness (generalized): Secondary | ICD-10-CM | POA: Diagnosis not present

## 2016-08-27 DIAGNOSIS — S2242XD Multiple fractures of ribs, left side, subsequent encounter for fracture with routine healing: Secondary | ICD-10-CM | POA: Diagnosis not present

## 2016-08-27 DIAGNOSIS — I951 Orthostatic hypotension: Secondary | ICD-10-CM | POA: Diagnosis not present

## 2016-08-27 DIAGNOSIS — R262 Difficulty in walking, not elsewhere classified: Secondary | ICD-10-CM | POA: Diagnosis not present

## 2016-08-27 DIAGNOSIS — Z7982 Long term (current) use of aspirin: Secondary | ICD-10-CM | POA: Diagnosis not present

## 2016-08-27 DIAGNOSIS — M6281 Muscle weakness (generalized): Secondary | ICD-10-CM | POA: Diagnosis not present

## 2016-08-28 DIAGNOSIS — Z7982 Long term (current) use of aspirin: Secondary | ICD-10-CM | POA: Diagnosis not present

## 2016-08-28 DIAGNOSIS — M6281 Muscle weakness (generalized): Secondary | ICD-10-CM | POA: Diagnosis not present

## 2016-08-28 DIAGNOSIS — S2242XD Multiple fractures of ribs, left side, subsequent encounter for fracture with routine healing: Secondary | ICD-10-CM | POA: Diagnosis not present

## 2016-08-28 DIAGNOSIS — R262 Difficulty in walking, not elsewhere classified: Secondary | ICD-10-CM | POA: Diagnosis not present

## 2016-08-28 DIAGNOSIS — I951 Orthostatic hypotension: Secondary | ICD-10-CM | POA: Diagnosis not present

## 2016-08-28 DIAGNOSIS — W19XXXD Unspecified fall, subsequent encounter: Secondary | ICD-10-CM | POA: Diagnosis not present

## 2016-08-29 DIAGNOSIS — M6281 Muscle weakness (generalized): Secondary | ICD-10-CM | POA: Diagnosis not present

## 2016-08-29 DIAGNOSIS — S2242XD Multiple fractures of ribs, left side, subsequent encounter for fracture with routine healing: Secondary | ICD-10-CM | POA: Diagnosis not present

## 2016-08-29 DIAGNOSIS — I951 Orthostatic hypotension: Secondary | ICD-10-CM | POA: Diagnosis not present

## 2016-08-29 DIAGNOSIS — Z7982 Long term (current) use of aspirin: Secondary | ICD-10-CM | POA: Diagnosis not present

## 2016-08-29 DIAGNOSIS — R262 Difficulty in walking, not elsewhere classified: Secondary | ICD-10-CM | POA: Diagnosis not present

## 2016-08-29 DIAGNOSIS — W19XXXD Unspecified fall, subsequent encounter: Secondary | ICD-10-CM | POA: Diagnosis not present

## 2016-09-01 NOTE — Progress Notes (Signed)
09/02/2016 4:00 PM   Carrie Calhoun 18-Aug-1930 [id number]  Referring provider: Morene Crocker, MD [phone] [facility] [address] [facility] [place], [place] [address]  Chief Complaint  Patient presents with  . Urinary Frequency    New Patient    HPI: Patient is a 83 -year-old Caucasian female who is referred to [place] by, Dr. Morene Calhoun, for urinary incontinence with her daughter, Carrie Calhoun.    Patient states that she has had urinary incontinence for several years.  Patient has incontinence with urge and stress.   She is experiencing 2 incontinent episodes during the day.  She is experiencing no incontinent episodes during the night.  Her incontinence volume is mild.   She is wearing 2 pads/depends daily.    She is having associated urinary frequency, urgency and nocturia x 2-4.  She does not have a history of urinary tract infections, STI's or injury to the bladder.   She denies dysuria, gross hematuria, suprapubic pain, back pain, abdominal pain or flank pain.   She has not had any recent fevers, chills, nausea or vomiting.     She does not have a history of nephrolithiasis, GU surgery or GU trauma.   She is not sexually active.    She is post menopausal.   She denies constipation and/or diarrhea.   She is not having pain with bladder filling.    She has not had any recent imaging studies.    She is drinking more water daily.   She is drinking 2 caffeinated beverages daily.  She is not drinking alcoholic beverages daily.    Her risk factors for incontinence are a family history of incontinence, age, caffeine and  vaginal atrophy.  She is taking antidepressants and OAB medications.  Her PVR was 30 mL.    Referral notes from Dr. Malvin Calhoun are reviewed.  Patient with gait instability and memory loss.  Oxybutynin 5 mg bid.      PMH: Past Medical History:  Diagnosis Date  . Anxiety   . Atrioventricular block   . Cardiac arrest (HCC)   . CKD (chronic kidney  disease) stage 3, GFR 30-59 ml/min   . Hypertension   . Renal disorder     Surgical History: Past Surgical History:  Procedure Laterality Date  . APPENDECTOMY    . CATARACT EXTRACTION    . TUBAL LIGATION      Home Medications:  Allergies as of 09/02/2016   No Known Allergies     Medication List       Accurate as of 09/02/16  4:00 PM. Always use your most recent med list.          acetaminophen 325 MG tablet Commonly known as:  TYLENOL Take 2 tablets (650 mg total) by mouth every 6 (six) hours as needed for mild pain (or Fever >/= 101).   aspirin EC 81 MG tablet Take 1 tablet by mouth daily.   busPIRone 15 MG tablet Commonly known as:  BUSPAR Take 1 tablet by mouth daily.   Calcium Carbonate-Vitamin D 600-400 MG-UNIT tablet Take 1 tablet by mouth daily.   citalopram 10 MG tablet Commonly known as:  CELEXA Take 1 tablet (10 mg total) by mouth every other day.   donepezil 5 MG tablet Commonly known as:  ARICEPT Take 1 tablet by mouth daily.   FISH OIL PO Take 1 tablet by mouth daily.   HAIR/SKIN/NAILS/BIOTIN PO Take 1 tablet by mouth daily.   levothyroxine 75 MCG tablet  Commonly known as:  SYNTHROID, LEVOTHROID Take 1 tablet by mouth daily before breakfast.   Lifitegrast 5 % Soln Place 1 drop into both eyes 2 (two) times daily.   mirabegron ER 25 MG Tb24 tablet Commonly known as:  MYRBETRIQ Take 1 tablet (25 mg total) by mouth daily.   oxybutynin 5 MG tablet Commonly known as:  DITROPAN       Allergies: No Known Allergies  Family History: Family History  Problem Relation Age of Onset  . Hypertension Mother   . Stroke Mother   . Breast cancer Maternal Aunt     Social History:  reports that she has never smoked. She has never used smokeless tobacco. She reports that she does not drink alcohol or use drugs.  ROS: UROLOGY Frequent Urination?: Yes Hard to postpone urination?: Yes Burning/pain with urination?: No Get up at night to urinate?:  Yes Leakage of urine?: No Urine stream starts and stops?: No Trouble starting stream?: No Do you have to strain to urinate?: No Blood in urine?: No Urinary tract infection?: No Sexually transmitted disease?: No Injury to kidneys or bladder?: No Painful intercourse?: No Weak stream?: No Currently pregnant?: No Vaginal bleeding?: No Last menstrual period?: n  Gastrointestinal Nausea?: No Vomiting?: No Indigestion/heartburn?: No Diarrhea?: No Constipation?: No  Constitutional Fever: No Night sweats?: No Weight loss?: No Fatigue?: No  Skin Skin rash/lesions?: Yes Itching?: Yes  Eyes Blurred vision?: No Double vision?: No  Ears/Nose/Throat Sore throat?: No Sinus problems?: No  Hematologic/Lymphatic Swollen glands?: No Easy bruising?: Yes  Cardiovascular Leg swelling?: Yes Chest pain?: No  Respiratory Cough?: Yes Shortness of breath?: No  Endocrine Excessive thirst?: No  Musculoskeletal Back pain?: Yes Joint pain?: Yes  Neurological Headaches?: No Dizziness?: No  Psychologic Depression?: Yes Anxiety?: Yes  Physical Exam: BP (!) 143/69   Pulse 60   Ht 5' (1.524 m)   Wt 115 lb (52.2 kg)   BMI 22.46 kg/m   Constitutional: Well nourished. Alert and oriented, No acute distress. HEENT: [place] AT, moist mucus membranes. Trachea midline, no masses. Cardiovascular: No clubbing, cyanosis, or edema. Respiratory: Normal respiratory effort, no increased work of breathing. GI: Abdomen is soft, non tender, non distended, no abdominal masses. Liver and spleen not palpable.  No hernias appreciated.  Stool sample for occult testing is not indicated.   GU: No CVA tenderness.  No bladder fullness or masses.  Atrophic external genitalia, normal pubic hair distribution, no lesions.  Normal urethral meatus, no lesions, no prolapse, no discharge.   No urethral masses, tenderness and/or tenderness. No bladder fullness, tenderness or masses. Normal vagina mucosa, good  estrogen effect, no discharge, no lesions, good pelvic support, no cystocele or rectocele noted.  No cervical motion tenderness.  Uterus is freely mobile and non-fixed.  No adnexal/parametria masses or tenderness noted.  Anus and perineum are without rashes or lesions.    Skin: No rashes, bruises or suspicious lesions. Lymph: No cervical or inguinal adenopathy. Neurologic: Grossly intact, no focal deficits, moving all 4 extremities. Psychiatric: Normal mood and affect.  Laboratory Data: Lab Results  Component Value Date   WBC 7.1 08/11/2016   HGB 12.1 08/11/2016   HCT 36.0 08/11/2016   MCV 93.6 08/11/2016   PLT 252 08/11/2016    Lab Results  Component Value Date   CREATININE 1.25 (H) 08/11/2016    Lab Results  Component Value Date   TSH 3.958 07/04/2016     Lab Results  Component Value Date   AST 30 07/04/2016  Lab Results  Component Value Date   ALT 21 07/04/2016    I have independently reviewed the labs.   Pertinent Imaging: Results for EMMELYN, SCHMALE (MRN [id number]) as of 09/03/2016 21:41  Ref. Range 09/02/2016 15:26  Scan Result Unknown 30ml   I have independently reviewed the films.    Assessment & Plan:    1. Mixed incontinence  - reviewed behavioral therapies, bladder training and bladder control strategies   - pelvic floor muscle training - patient deferred  - fluid management - staff and family are encouraging the patient to drink more water  - offered medical therapy with anticholinergic therapy or beta-3 adrenergic receptor agonist and the potential side effects of each therapy - patient will discontinue the oxybutynin due to the possible side effect of cognitive dysfunction  - would like to try the beta-3 adrenergic receptor agonist (Myrbetriq).  Given Myrbetriq 50 mg samples, #28.  I have reviewed with the patient of the side effects of Myrbetriq, such as: elevation in BP, urinary retention and/or HA.    - RTC in 3 weeks for PVR and symptom recheck    2. Nocturia  - Daughter feels that she may have sleep apnea as she states her mother is "foggy" in the morning  - The patient may benefit from a discussion with his or her primary care physician to see if he or she has risk factors for sleep apnea or other sleep disturbances and obtaining a sleep study.  3. Vaginal atrophy  - patient not interested in vaginal estrogen cream at this time   Return in about 3 weeks (around 09/23/2016) for PVR and OAB questionnaire.  These notes generated with voice recognition software. I apologize for typographical errors.  Michiel Cowboy, PA-C  [facility] [address], [address] [place], [place] [address] [phone]

## 2016-09-02 ENCOUNTER — Encounter: Payer: Self-pay | Admitting: Urology

## 2016-09-02 ENCOUNTER — Ambulatory Visit: Payer: Medicare HMO | Admitting: Urology

## 2016-09-02 VITALS — BP 143/69 | HR 60 | Ht 60.0 in | Wt 115.0 lb

## 2016-09-02 DIAGNOSIS — N952 Postmenopausal atrophic vaginitis: Secondary | ICD-10-CM | POA: Diagnosis not present

## 2016-09-02 DIAGNOSIS — R350 Frequency of micturition: Secondary | ICD-10-CM | POA: Diagnosis not present

## 2016-09-02 DIAGNOSIS — N183 Chronic kidney disease, stage 3 unspecified: Secondary | ICD-10-CM | POA: Insufficient documentation

## 2016-09-02 DIAGNOSIS — R351 Nocturia: Secondary | ICD-10-CM | POA: Diagnosis not present

## 2016-09-02 DIAGNOSIS — M8580 Other specified disorders of bone density and structure, unspecified site: Secondary | ICD-10-CM | POA: Insufficient documentation

## 2016-09-02 DIAGNOSIS — D649 Anemia, unspecified: Secondary | ICD-10-CM | POA: Insufficient documentation

## 2016-09-02 DIAGNOSIS — E785 Hyperlipidemia, unspecified: Secondary | ICD-10-CM | POA: Insufficient documentation

## 2016-09-02 DIAGNOSIS — N3946 Mixed incontinence: Secondary | ICD-10-CM | POA: Diagnosis not present

## 2016-09-02 DIAGNOSIS — M5136 Other intervertebral disc degeneration, lumbar region: Secondary | ICD-10-CM | POA: Insufficient documentation

## 2016-09-02 LAB — BLADDER SCAN AMB NON-IMAGING

## 2016-09-02 MED ORDER — MIRABEGRON ER 25 MG PO TB24: 25.0000 mg | ORAL_TABLET | Freq: Every day | ORAL | 0 refills | Status: AC

## 2016-09-03 DIAGNOSIS — W19XXXD Unspecified fall, subsequent encounter: Secondary | ICD-10-CM | POA: Diagnosis not present

## 2016-09-03 DIAGNOSIS — Z7982 Long term (current) use of aspirin: Secondary | ICD-10-CM | POA: Diagnosis not present

## 2016-09-03 DIAGNOSIS — M6281 Muscle weakness (generalized): Secondary | ICD-10-CM | POA: Diagnosis not present

## 2016-09-03 DIAGNOSIS — R262 Difficulty in walking, not elsewhere classified: Secondary | ICD-10-CM | POA: Diagnosis not present

## 2016-09-03 DIAGNOSIS — S2242XD Multiple fractures of ribs, left side, subsequent encounter for fracture with routine healing: Secondary | ICD-10-CM | POA: Diagnosis not present

## 2016-09-03 DIAGNOSIS — I951 Orthostatic hypotension: Secondary | ICD-10-CM | POA: Diagnosis not present

## 2016-09-05 DIAGNOSIS — R262 Difficulty in walking, not elsewhere classified: Secondary | ICD-10-CM | POA: Diagnosis not present

## 2016-09-05 DIAGNOSIS — I951 Orthostatic hypotension: Secondary | ICD-10-CM | POA: Diagnosis not present

## 2016-09-05 DIAGNOSIS — S2242XD Multiple fractures of ribs, left side, subsequent encounter for fracture with routine healing: Secondary | ICD-10-CM | POA: Diagnosis not present

## 2016-09-05 DIAGNOSIS — Z7982 Long term (current) use of aspirin: Secondary | ICD-10-CM | POA: Diagnosis not present

## 2016-09-05 DIAGNOSIS — M6281 Muscle weakness (generalized): Secondary | ICD-10-CM | POA: Diagnosis not present

## 2016-09-05 DIAGNOSIS — W19XXXD Unspecified fall, subsequent encounter: Secondary | ICD-10-CM | POA: Diagnosis not present

## 2016-09-06 DIAGNOSIS — W19XXXD Unspecified fall, subsequent encounter: Secondary | ICD-10-CM | POA: Diagnosis not present

## 2016-09-06 DIAGNOSIS — I951 Orthostatic hypotension: Secondary | ICD-10-CM | POA: Diagnosis not present

## 2016-09-06 DIAGNOSIS — S2242XD Multiple fractures of ribs, left side, subsequent encounter for fracture with routine healing: Secondary | ICD-10-CM | POA: Diagnosis not present

## 2016-09-06 DIAGNOSIS — R262 Difficulty in walking, not elsewhere classified: Secondary | ICD-10-CM | POA: Diagnosis not present

## 2016-09-06 DIAGNOSIS — Z7982 Long term (current) use of aspirin: Secondary | ICD-10-CM | POA: Diagnosis not present

## 2016-09-06 DIAGNOSIS — M6281 Muscle weakness (generalized): Secondary | ICD-10-CM | POA: Diagnosis not present

## 2016-09-10 ENCOUNTER — Inpatient Hospital Stay: Payer: Medicare HMO | Admitting: Anesthesiology

## 2016-09-10 ENCOUNTER — Encounter: Payer: Self-pay | Admitting: Emergency Medicine

## 2016-09-10 ENCOUNTER — Encounter
Admission: EM | Disposition: A | Discharge status: Skilled Nursing Facility | Payer: Self-pay | Source: Home / Self Care | Attending: Internal Medicine

## 2016-09-10 ENCOUNTER — Inpatient Hospital Stay
Admission: EM | Admit: 2016-09-10 | Discharge: 2016-09-13 | DRG: 480 | Disposition: A | Discharge status: Skilled Nursing Facility | Payer: Medicare HMO | Attending: Internal Medicine | Admitting: Internal Medicine

## 2016-09-10 ENCOUNTER — Inpatient Hospital Stay: Payer: Medicare HMO

## 2016-09-10 ENCOUNTER — Emergency Department: Payer: Medicare HMO

## 2016-09-10 ENCOUNTER — Encounter
Admission: RE | Admit: 2016-09-10 | Discharge: 2016-09-10 | Disposition: A | Discharge status: Home / Self Care | Payer: Medicare HMO | Source: Ambulatory Visit | Attending: Internal Medicine | Admitting: Internal Medicine

## 2016-09-10 DIAGNOSIS — Z9851 Tubal ligation status: Secondary | ICD-10-CM

## 2016-09-10 DIAGNOSIS — S72141A Displaced intertrochanteric fracture of right femur, initial encounter for closed fracture: Secondary | ICD-10-CM | POA: Diagnosis not present

## 2016-09-10 DIAGNOSIS — S72009A Fracture of unspecified part of neck of unspecified femur, initial encounter for closed fracture: Secondary | ICD-10-CM

## 2016-09-10 DIAGNOSIS — Y92129 Unspecified place in nursing home as the place of occurrence of the external cause: Secondary | ICD-10-CM | POA: Diagnosis not present

## 2016-09-10 DIAGNOSIS — Z823 Family history of stroke: Secondary | ICD-10-CM

## 2016-09-10 DIAGNOSIS — T50905A Adverse effect of unspecified drugs, medicaments and biological substances, initial encounter: Secondary | ICD-10-CM | POA: Diagnosis present

## 2016-09-10 DIAGNOSIS — W010XXA Fall on same level from slipping, tripping and stumbling without subsequent striking against object, initial encounter: Secondary | ICD-10-CM | POA: Diagnosis present

## 2016-09-10 DIAGNOSIS — N183 Chronic kidney disease, stage 3 (moderate): Secondary | ICD-10-CM | POA: Diagnosis present

## 2016-09-10 DIAGNOSIS — S72001A Fracture of unspecified part of neck of right femur, initial encounter for closed fracture: Secondary | ICD-10-CM | POA: Diagnosis present

## 2016-09-10 DIAGNOSIS — Z8249 Family history of ischemic heart disease and other diseases of the circulatory system: Secondary | ICD-10-CM | POA: Diagnosis not present

## 2016-09-10 DIAGNOSIS — Z66 Do not resuscitate: Secondary | ICD-10-CM | POA: Diagnosis present

## 2016-09-10 DIAGNOSIS — I129 Hypertensive chronic kidney disease with stage 1 through stage 4 chronic kidney disease, or unspecified chronic kidney disease: Secondary | ICD-10-CM | POA: Diagnosis not present

## 2016-09-10 DIAGNOSIS — Z79899 Other long term (current) drug therapy: Secondary | ICD-10-CM | POA: Diagnosis not present

## 2016-09-10 DIAGNOSIS — M25559 Pain in unspecified hip: Secondary | ICD-10-CM | POA: Diagnosis not present

## 2016-09-10 DIAGNOSIS — Z8674 Personal history of sudden cardiac arrest: Secondary | ICD-10-CM | POA: Diagnosis not present

## 2016-09-10 DIAGNOSIS — E039 Hypothyroidism, unspecified: Secondary | ICD-10-CM | POA: Diagnosis not present

## 2016-09-10 DIAGNOSIS — Z9181 History of falling: Secondary | ICD-10-CM | POA: Diagnosis not present

## 2016-09-10 DIAGNOSIS — F0390 Unspecified dementia without behavioral disturbance: Secondary | ICD-10-CM | POA: Diagnosis not present

## 2016-09-10 DIAGNOSIS — Z01818 Encounter for other preprocedural examination: Secondary | ICD-10-CM | POA: Diagnosis not present

## 2016-09-10 DIAGNOSIS — S7291XA Unspecified fracture of right femur, initial encounter for closed fracture: Secondary | ICD-10-CM | POA: Diagnosis not present

## 2016-09-10 DIAGNOSIS — S72121A Displaced fracture of lesser trochanter of right femur, initial encounter for closed fracture: Secondary | ICD-10-CM | POA: Diagnosis not present

## 2016-09-10 DIAGNOSIS — M80051D Age-related osteoporosis with current pathological fracture, right femur, subsequent encounter for fracture with routine healing: Secondary | ICD-10-CM | POA: Diagnosis not present

## 2016-09-10 DIAGNOSIS — Z803 Family history of malignant neoplasm of breast: Secondary | ICD-10-CM | POA: Diagnosis not present

## 2016-09-10 DIAGNOSIS — R2681 Unsteadiness on feet: Secondary | ICD-10-CM | POA: Diagnosis not present

## 2016-09-10 DIAGNOSIS — D62 Acute posthemorrhagic anemia: Secondary | ICD-10-CM | POA: Diagnosis not present

## 2016-09-10 DIAGNOSIS — R079 Chest pain, unspecified: Secondary | ICD-10-CM | POA: Diagnosis not present

## 2016-09-10 DIAGNOSIS — G92 Toxic encephalopathy: Secondary | ICD-10-CM | POA: Diagnosis present

## 2016-09-10 DIAGNOSIS — Z419 Encounter for procedure for purposes other than remedying health state, unspecified: Secondary | ICD-10-CM

## 2016-09-10 DIAGNOSIS — Z7401 Bed confinement status: Secondary | ICD-10-CM | POA: Diagnosis not present

## 2016-09-10 DIAGNOSIS — I10 Essential (primary) hypertension: Secondary | ICD-10-CM | POA: Diagnosis not present

## 2016-09-10 DIAGNOSIS — F418 Other specified anxiety disorders: Secondary | ICD-10-CM | POA: Diagnosis not present

## 2016-09-10 DIAGNOSIS — N3281 Overactive bladder: Secondary | ICD-10-CM | POA: Diagnosis present

## 2016-09-10 DIAGNOSIS — Z9049 Acquired absence of other specified parts of digestive tract: Secondary | ICD-10-CM

## 2016-09-10 DIAGNOSIS — F0280 Dementia in other diseases classified elsewhere without behavioral disturbance: Secondary | ICD-10-CM | POA: Diagnosis not present

## 2016-09-10 DIAGNOSIS — Z7982 Long term (current) use of aspirin: Secondary | ICD-10-CM | POA: Diagnosis not present

## 2016-09-10 DIAGNOSIS — W19XXXA Unspecified fall, initial encounter: Secondary | ICD-10-CM | POA: Diagnosis not present

## 2016-09-10 DIAGNOSIS — Z9849 Cataract extraction status, unspecified eye: Secondary | ICD-10-CM

## 2016-09-10 DIAGNOSIS — M25551 Pain in right hip: Secondary | ICD-10-CM | POA: Diagnosis not present

## 2016-09-10 DIAGNOSIS — M6281 Muscle weakness (generalized): Secondary | ICD-10-CM | POA: Diagnosis not present

## 2016-09-10 DIAGNOSIS — S299XXA Unspecified injury of thorax, initial encounter: Secondary | ICD-10-CM | POA: Diagnosis not present

## 2016-09-10 DIAGNOSIS — I4430 Unspecified atrioventricular block: Secondary | ICD-10-CM | POA: Diagnosis not present

## 2016-09-10 HISTORY — PX: INTRAMEDULLARY (IM) NAIL INTERTROCHANTERIC: SHX5875

## 2016-09-10 LAB — CBC WITH DIFFERENTIAL/PLATELET
BASOS ABS: 0.1 10*3/uL (ref 0–0.1)
Basophils Relative: 1 %
Eosinophils Absolute: 0.2 10*3/uL (ref 0–0.7)
Eosinophils Relative: 2 %
HCT: 32.6 % — ABNORMAL LOW (ref 35.0–47.0)
Hemoglobin: 10.8 g/dL — ABNORMAL LOW (ref 12.0–16.0)
LYMPHS PCT: 10 %
Lymphs Abs: 1.1 10*3/uL (ref 1.0–3.6)
MCH: 31.2 pg (ref 26.0–34.0)
MCHC: 33.2 g/dL (ref 32.0–36.0)
MCV: 94 fL (ref 80.0–100.0)
Monocytes Absolute: 0.7 10*3/uL (ref 0.2–0.9)
Monocytes Relative: 6 %
NEUTROS ABS: 9.2 10*3/uL — AB (ref 1.4–6.5)
NEUTROS PCT: 81 %
PLATELETS: 254 10*3/uL (ref 150–440)
RBC: 3.46 MIL/uL — AB (ref 3.80–5.20)
RDW: 13.5 % (ref 11.5–14.5)
WBC: 11.2 10*3/uL — ABNORMAL HIGH (ref 3.6–11.0)

## 2016-09-10 LAB — MRSA PCR SCREENING: MRSA by PCR: NEGATIVE

## 2016-09-10 LAB — BASIC METABOLIC PANEL
Anion gap: 8 (ref 5–15)
BUN: 32 mg/dL — ABNORMAL HIGH (ref 6–20)
CHLORIDE: 103 mmol/L (ref 101–111)
CO2: 28 mmol/L (ref 22–32)
CREATININE: 1.27 mg/dL — AB (ref 0.44–1.00)
Calcium: 9.3 mg/dL (ref 8.9–10.3)
GFR calc non Af Amer: 38 mL/min — ABNORMAL LOW (ref >60)
GFR, EST AFRICAN AMERICAN: 44 mL/min — AB (ref >60)
Glucose, Bld: 121 mg/dL — ABNORMAL HIGH (ref 65–99)
POTASSIUM: 4.2 mmol/L (ref 3.5–5.1)
SODIUM: 139 mmol/L (ref 135–145)

## 2016-09-10 LAB — PROTIME-INR
INR: 0.99
Prothrombin Time: 13.1 seconds (ref 11.4–15.2)

## 2016-09-10 LAB — APTT: APTT: 27 s (ref 24–36)

## 2016-09-10 SURGERY — FIXATION, FRACTURE, INTERTROCHANTERIC, WITH INTRAMEDULLARY ROD
Anesthesia: Spinal | Site: Hip | Laterality: Right | Wound class: Clean

## 2016-09-10 MED ORDER — OXYCODONE HCL 5 MG PO TABS
5.0000 mg | ORAL_TABLET | ORAL | Status: DC | PRN
  Administered 2016-09-10 – 2016-09-11 (×3): 5 mg via ORAL
  Filled 2016-09-10 (×3): qty 1

## 2016-09-10 MED ORDER — CEFAZOLIN SODIUM-DEXTROSE 1-4 GM/50ML-% IV SOLN
1.0000 g | Freq: Three times a day (TID) | INTRAVENOUS | Status: AC
  Administered 2016-09-11 (×2): 1 g via INTRAVENOUS
  Filled 2016-09-10 (×2): qty 50

## 2016-09-10 MED ORDER — ASPIRIN EC 81 MG PO TBEC
81.0000 mg | DELAYED_RELEASE_TABLET | Freq: Every day | ORAL | Status: DC
  Administered 2016-09-12 – 2016-09-13 (×2): 81 mg via ORAL
  Filled 2016-09-10 (×3): qty 1

## 2016-09-10 MED ORDER — FENTANYL CITRATE (PF) 100 MCG/2ML IJ SOLN
50.0000 ug | Freq: Once | INTRAMUSCULAR | Status: AC
  Administered 2016-09-10: 50 ug via INTRAVENOUS

## 2016-09-10 MED ORDER — LEVOTHYROXINE SODIUM 50 MCG PO TABS
75.0000 ug | ORAL_TABLET | Freq: Every day | ORAL | Status: DC
  Administered 2016-09-11 – 2016-09-13 (×3): 75 ug via ORAL
  Filled 2016-09-10 (×3): qty 1

## 2016-09-10 MED ORDER — ONDANSETRON HCL 4 MG/2ML IJ SOLN: 4.0000 mg | Freq: Once | INTRAMUSCULAR | Status: DC | PRN

## 2016-09-10 MED ORDER — MAGNESIUM CITRATE PO SOLN
1.0000 | Freq: Once | ORAL | Status: DC | PRN
  Filled 2016-09-10: qty 296

## 2016-09-10 MED ORDER — SODIUM CHLORIDE 0.9 % IR SOLN
Status: DC | PRN
  Administered 2016-09-10: 500 mL

## 2016-09-10 MED ORDER — MIDAZOLAM HCL 2 MG/2ML IJ SOLN
INTRAMUSCULAR | Status: AC
Start: 2016-09-10 — End: 2016-09-10
  Filled 2016-09-10: qty 2

## 2016-09-10 MED ORDER — MORPHINE SULFATE (PF) 2 MG/ML IV SOLN: 2.0000 mg | INTRAVENOUS | Status: DC | PRN

## 2016-09-10 MED ORDER — CEFAZOLIN SODIUM-DEXTROSE 2-4 GM/100ML-% IV SOLN
2.0000 g | INTRAVENOUS | Status: AC
  Administered 2016-09-10: 2 g via INTRAVENOUS
  Filled 2016-09-10: qty 100

## 2016-09-10 MED ORDER — BUPIVACAINE HCL (PF) 0.5 % IJ SOLN
INTRAMUSCULAR | Status: AC
  Filled 2016-09-10: qty 10

## 2016-09-10 MED ORDER — DOCUSATE SODIUM 100 MG PO CAPS
100.0000 mg | ORAL_CAPSULE | Freq: Two times a day (BID) | ORAL | Status: DC
  Administered 2016-09-10 – 2016-09-13 (×6): 100 mg via ORAL
  Filled 2016-09-10 (×6): qty 1

## 2016-09-10 MED ORDER — PROPOFOL 500 MG/50ML IV EMUL
INTRAVENOUS | Status: DC | PRN
  Administered 2016-09-10: 10 ug/kg/min via INTRAVENOUS

## 2016-09-10 MED ORDER — ONDANSETRON HCL 4 MG PO TABS: 4.0000 mg | ORAL_TABLET | Freq: Four times a day (QID) | ORAL | Status: DC | PRN

## 2016-09-10 MED ORDER — PROPOFOL 500 MG/50ML IV EMUL
INTRAVENOUS | Status: AC
  Filled 2016-09-10: qty 50

## 2016-09-10 MED ORDER — MIRABEGRON ER 25 MG PO TB24
25.0000 mg | ORAL_TABLET | Freq: Every day | ORAL | Status: DC
  Administered 2016-09-11 – 2016-09-13 (×3): 25 mg via ORAL
  Filled 2016-09-10 (×4): qty 1

## 2016-09-10 MED ORDER — HAIR/SKIN/NAILS/BIOTIN PO TABS: ORAL_TABLET | Freq: Every day | ORAL | Status: DC

## 2016-09-10 MED ORDER — HYDROCODONE-ACETAMINOPHEN 5-325 MG PO TABS
1.0000 | ORAL_TABLET | ORAL | Status: DC | PRN
  Administered 2016-09-10: 2 via ORAL
  Filled 2016-09-10: qty 2

## 2016-09-10 MED ORDER — BISACODYL 10 MG RE SUPP
10.0000 mg | Freq: Every day | RECTAL | Status: DC | PRN
  Administered 2016-09-13: 10 mg via RECTAL
  Filled 2016-09-10: qty 1

## 2016-09-10 MED ORDER — FENTANYL CITRATE (PF) 100 MCG/2ML IJ SOLN: 25.0000 ug | INTRAMUSCULAR | Status: DC | PRN

## 2016-09-10 MED ORDER — ACETAMINOPHEN 650 MG RE SUPP: 650.0000 mg | Freq: Four times a day (QID) | RECTAL | Status: DC | PRN

## 2016-09-10 MED ORDER — METHOCARBAMOL 1000 MG/10ML IJ SOLN
500.0000 mg | Freq: Four times a day (QID) | INTRAVENOUS | Status: DC | PRN
  Filled 2016-09-10: qty 5

## 2016-09-10 MED ORDER — DONEPEZIL HCL 5 MG PO TABS
5.0000 mg | ORAL_TABLET | Freq: Every day | ORAL | Status: DC
  Administered 2016-09-11 – 2016-09-13 (×3): 5 mg via ORAL
  Filled 2016-09-10 (×4): qty 1

## 2016-09-10 MED ORDER — ACETAMINOPHEN 325 MG PO TABS: 650.0000 mg | ORAL_TABLET | Freq: Four times a day (QID) | ORAL | Status: DC | PRN

## 2016-09-10 MED ORDER — POLYETHYLENE GLYCOL 3350 17 G PO PACK: 17.0000 g | PACK | Freq: Every day | ORAL | Status: DC | PRN

## 2016-09-10 MED ORDER — ENOXAPARIN SODIUM 30 MG/0.3ML ~~LOC~~ SOLN
30.0000 mg | SUBCUTANEOUS | Status: DC
  Administered 2016-09-11 – 2016-09-12 (×2): 30 mg via SUBCUTANEOUS
  Filled 2016-09-10 (×2): qty 0.3

## 2016-09-10 MED ORDER — BISACODYL 5 MG PO TBEC: 5.0000 mg | DELAYED_RELEASE_TABLET | Freq: Every day | ORAL | Status: DC | PRN

## 2016-09-10 MED ORDER — ACETAMINOPHEN 325 MG PO TABS
650.0000 mg | ORAL_TABLET | Freq: Four times a day (QID) | ORAL | Status: DC | PRN
  Administered 2016-09-12: 650 mg via ORAL
  Filled 2016-09-10: qty 2

## 2016-09-10 MED ORDER — PHENYLEPHRINE HCL 10 MG/ML IJ SOLN
INTRAMUSCULAR | Status: DC | PRN
  Administered 2016-09-10: 100 ug via INTRAVENOUS

## 2016-09-10 MED ORDER — SODIUM CHLORIDE 0.9 % IV SOLN
75.0000 mL/h | INTRAVENOUS | Status: DC
  Administered 2016-09-10 – 2016-09-11 (×2): 75 mL/h via INTRAVENOUS

## 2016-09-10 MED ORDER — SODIUM CHLORIDE 0.9 % IV SOLN
INTRAVENOUS | Status: DC | PRN
  Administered 2016-09-10: 15 ug/min via INTRAVENOUS

## 2016-09-10 MED ORDER — FENTANYL CITRATE (PF) 100 MCG/2ML IJ SOLN
INTRAMUSCULAR | Status: AC
  Filled 2016-09-10: qty 2

## 2016-09-10 MED ORDER — CITALOPRAM HYDROBROMIDE 20 MG PO TABS
10.0000 mg | ORAL_TABLET | ORAL | Status: DC
  Administered 2016-09-11 – 2016-09-13 (×2): 10 mg via ORAL
  Filled 2016-09-10 (×2): qty 1

## 2016-09-10 MED ORDER — DOCUSATE SODIUM 100 MG PO CAPS: 100.0000 mg | ORAL_CAPSULE | Freq: Two times a day (BID) | ORAL | Status: DC

## 2016-09-10 MED ORDER — ONDANSETRON HCL 4 MG/2ML IJ SOLN
4.0000 mg | Freq: Once | INTRAMUSCULAR | Status: AC
  Administered 2016-09-10: 4 mg via INTRAVENOUS
  Filled 2016-09-10: qty 2

## 2016-09-10 MED ORDER — PROPOFOL 10 MG/ML IV BOLUS
INTRAVENOUS | Status: DC | PRN
  Administered 2016-09-10: 20 mg via INTRAVENOUS

## 2016-09-10 MED ORDER — ONDANSETRON HCL 4 MG/2ML IJ SOLN
4.0000 mg | Freq: Four times a day (QID) | INTRAMUSCULAR | Status: DC | PRN
  Administered 2016-09-11: 4 mg via INTRAVENOUS
  Filled 2016-09-10: qty 2

## 2016-09-10 MED ORDER — BUSPIRONE HCL 10 MG PO TABS
15.0000 mg | ORAL_TABLET | Freq: Every day | ORAL | Status: DC
  Administered 2016-09-11 – 2016-09-13 (×3): 15 mg via ORAL
  Filled 2016-09-10 (×3): qty 2

## 2016-09-10 MED ORDER — ALUM & MAG HYDROXIDE-SIMETH 200-200-20 MG/5ML PO SUSP: 30.0000 mL | Freq: Four times a day (QID) | ORAL | Status: DC | PRN

## 2016-09-10 MED ORDER — TRAZODONE HCL 50 MG PO TABS: 25.0000 mg | ORAL_TABLET | Freq: Every evening | ORAL | Status: DC | PRN

## 2016-09-10 MED ORDER — MIDAZOLAM HCL 5 MG/5ML IJ SOLN
INTRAMUSCULAR | Status: DC | PRN
  Administered 2016-09-10: 0.5 mg via INTRAVENOUS

## 2016-09-10 MED ORDER — HEPARIN SODIUM (PORCINE) 5000 UNIT/ML IJ SOLN: 5000.0000 [IU] | Freq: Three times a day (TID) | INTRAMUSCULAR | Status: DC

## 2016-09-10 MED ORDER — CALCIUM CARBONATE-VITAMIN D 500-200 MG-UNIT PO TABS
1.0000 | ORAL_TABLET | Freq: Every day | ORAL | Status: DC
  Administered 2016-09-11 – 2016-09-13 (×3): 1 via ORAL
  Filled 2016-09-10 (×3): qty 1

## 2016-09-10 MED ORDER — SENNA 8.6 MG PO TABS
1.0000 | ORAL_TABLET | Freq: Two times a day (BID) | ORAL | Status: DC
  Administered 2016-09-10 – 2016-09-13 (×6): 8.6 mg via ORAL
  Filled 2016-09-10 (×6): qty 1

## 2016-09-10 MED ORDER — METHOCARBAMOL 500 MG PO TABS
500.0000 mg | ORAL_TABLET | Freq: Four times a day (QID) | ORAL | Status: DC | PRN
  Administered 2016-09-11: 500 mg via ORAL
  Filled 2016-09-10: qty 1

## 2016-09-10 MED ORDER — SODIUM CHLORIDE 0.9 % IV SOLN
INTRAVENOUS | Status: DC
  Administered 2016-09-10 (×2): via INTRAVENOUS

## 2016-09-10 MED ORDER — ONDANSETRON HCL 4 MG/2ML IJ SOLN: 4.0000 mg | Freq: Four times a day (QID) | INTRAMUSCULAR | Status: DC | PRN

## 2016-09-10 SURGICAL SUPPLY — 40 items
BIT DRILL 4.3MMS DISTAL GRDTED (BIT) ×1 IMPLANT
BNDG COHESIVE 6X5 TAN STRL LF (GAUZE/BANDAGES/DRESSINGS) ×6 IMPLANT
CANISTER SUCT 1200ML W/VALVE (MISCELLANEOUS) ×3 IMPLANT
DRAPE INCISE IOBAN 66X45 STRL (DRAPES) ×3 IMPLANT
DRAPE SHEET LG 3/4 BI-LAMINATE (DRAPES) ×6 IMPLANT
DRAPE SURG 17X11 SM STRL (DRAPES) ×6 IMPLANT
DRAPE U-SHAPE 47X51 STRL (DRAPES) ×3 IMPLANT
DRILL 4.3MMS DISTAL GRADUATED (BIT) ×3
DRSG OPSITE POSTOP 3X4 (GAUZE/BANDAGES/DRESSINGS) ×12 IMPLANT
DRSG OPSITE POSTOP 4X14 (GAUZE/BANDAGES/DRESSINGS) IMPLANT
DRSG OPSITE POSTOP 4X6 (GAUZE/BANDAGES/DRESSINGS) ×3 IMPLANT
DURAPREP 26ML APPLICATOR (WOUND CARE) ×6 IMPLANT
ELECT REM PT RETURN 9FT ADLT (ELECTROSURGICAL) ×3
ELECTRODE REM PT RTRN 9FT ADLT (ELECTROSURGICAL) ×1 IMPLANT
GLOVE BIOGEL PI IND STRL 9 (GLOVE) ×3 IMPLANT
GLOVE BIOGEL PI INDICATOR 9 (GLOVE) ×6
GLOVE SURG 9.0 ORTHO LTXF (GLOVE) ×9 IMPLANT
GOWN STRL REUS TWL 2XL XL LVL4 (GOWN DISPOSABLE) ×3 IMPLANT
GOWN STRL REUS W/ TWL LRG LVL3 (GOWN DISPOSABLE) ×2 IMPLANT
GOWN STRL REUS W/TWL LRG LVL3 (GOWN DISPOSABLE) ×4
GUIDEPIN VERSANAIL DSP 3.2X444 (ORTHOPEDIC DISPOSABLE SUPPLIES) ×3 IMPLANT
GUIDEWIRE BALL NOSE 80CM (WIRE) ×3 IMPLANT
HEMOVAC 400CC 10FR (MISCELLANEOUS) ×3 IMPLANT
HFN RH 130 DEG 9MM X 320MM (Nail) ×3 IMPLANT
HIP FRA NAIL LAG SCREW 10.5X90 (Orthopedic Implant) ×3 IMPLANT
KIT RM TURNOVER CYSTO AR (KITS) ×3 IMPLANT
MAT BLUE FLOOR 46X72 FLO (MISCELLANEOUS) ×3 IMPLANT
NS IRRIG 1000ML POUR BTL (IV SOLUTION) ×3 IMPLANT
PACK HIP COMPR (MISCELLANEOUS) ×6 IMPLANT
SCREW BONE CORTICAL 5.0X42 (Screw) ×3 IMPLANT
SCREW BONE CORTICAL 5.0X44 (Screw) ×3 IMPLANT
SCREW LAG HIP FRA NAIL 10.5X90 (Orthopedic Implant) ×1 IMPLANT
STAPLER SKIN PROX 35W (STAPLE) ×3 IMPLANT
SUCTION FRAZIER HANDLE 10FR (MISCELLANEOUS) ×2
SUCTION TUBE FRAZIER 10FR DISP (MISCELLANEOUS) ×1 IMPLANT
SUT VIC AB 0 CT1 36 (SUTURE) ×6 IMPLANT
SUT VIC AB 2-0 CT1 27 (SUTURE) ×2
SUT VIC AB 2-0 CT1 TAPERPNT 27 (SUTURE) ×1 IMPLANT
SUT VICRYL 0 AB UR-6 (SUTURE) ×3 IMPLANT
SYR 30ML LL (SYRINGE) ×3 IMPLANT

## 2016-09-10 NOTE — Anesthesia Preprocedure Evaluation (Addendum)
Anesthesia Evaluation  Patient identified by MRN, date of birth, ID band Patient awake    Reviewed: Allergy & Precautions, NPO status , Patient's Chart, lab work & pertinent test results, reviewed documented beta blocker date and time   Airway Mallampati: II  TM Distance: >3 FB     Dental  (+) Chipped   Pulmonary neg pulmonary ROS,           Cardiovascular hypertension, Pt. on medications + dysrhythmias      Neuro/Psych PSYCHIATRIC DISORDERS Anxiety Depression Bilateral carpal tunnel syndrome  Neuromuscular disease    GI/Hepatic   Endo/Other  negative endocrine ROS  Renal/GU Renal InsufficiencyRenal disease  negative genitourinary   Musculoskeletal  (+) Arthritis , Osteoarthritis,    Abdominal   Peds negative pediatric ROS (+)  Hematology  (+) anemia ,   Anesthesia Other Findings Past Medical History: No date: Anxiety No date: Atrioventricular block No date: Cardiac arrest (HCC) No date: CKD (chronic kidney disease) stage 3, GFR 30-5* No date: Hypertension   Cardiac "arrest" was during a cataract surgery on or before 2015.Marland Kitchen. Patient is followed by Dr.Parachos. No date: Renal disorder  Reproductive/Obstetrics                           Anesthesia Physical Anesthesia Plan  ASA: III  Anesthesia Plan: Spinal   Post-op Pain Management:    Induction:   PONV Risk Score and Plan:   Airway Management Planned:   Additional Equipment:   Intra-op Plan:   Post-operative Plan:   Informed Consent: I have reviewed the patients History and Physical, chart, labs and discussed the procedure including the risks, benefits and alternatives for the proposed anesthesia with the patient or authorized representative who has indicated his/her understanding and acceptance.   Dental advisory given  Plan Discussed with: CRNA and Surgeon  Anesthesia Plan Comments:       Anesthesia Quick  Evaluation

## 2016-09-10 NOTE — ED Notes (Signed)
Wallet found by [facility] in truck - they are bringing it here. Son, pt informed.

## 2016-09-10 NOTE — ED Notes (Addendum)
Son called [facility] - they are looking for wallet and will let him know. ED sec. Is calling [facility] to see if wallet fell in truck. I looked again in linen basket and it is not there.

## 2016-09-10 NOTE — NC FL2 (Signed)
[place] MEDICAID FL2 LEVEL OF CARE SCREENING TOOL     IDENTIFICATION  Patient Name: Carrie Calhoun Birthdate: 10-19-1930 Sex: female Admission Date (Current Location): 09/10/2016  [place] and [place] Number:  Chiropodist and Address:  [facility], [address], [place], [place] [address]      Provider Number: [id number]  Attending Physician Name and Address:  Katha Hamming, MD  Relative Name and Phone Number:       Current Level of Care: Hospital Recommended Level of Care: Skilled Nursing Facility Prior Approval Number:    Date Approved/Denied:   PASRR Number:  [id number] )  Discharge Plan: SNF    Current Diagnoses: Patient Active Problem List   Diagnosis Date Noted  . Hip fracture (HCC) 09/10/2016  . Osteopenia 09/02/2016  . Hyperlipidemia, unspecified 09/02/2016  . DDD (degenerative disc disease), lumbar 09/02/2016  . CKD (chronic kidney disease), stage III 09/02/2016  . Anemia, unspecified 09/02/2016  . Ataxia 07/22/2016  . Dementia arising in the senium and presenium 07/15/2016  . Multiple rib fractures 07/04/2016  . Moderate major depression, single episode (HCC) 06/14/2016  . Difficulty walking 01/08/2016  . Hypothyroidism due to acquired atrophy of thyroid 07/21/2015  . Paresthesia of both hands 06/28/2014  . Bilateral carpal tunnel syndrome 06/28/2014  . Tremor 12/28/2013  . Restless legs 12/28/2013  . Loss of memory 12/28/2013  . Essential hypertension 12/28/2013    Orientation RESPIRATION BLADDER Height & Weight     Self, Time, Place, Situation  Normal Continent Weight: 115 lb (52.2 kg) Height:  5' (152.4 cm)  BEHAVIORAL SYMPTOMS/MOOD NEUROLOGICAL BOWEL NUTRITION STATUS   (none)  (none) Continent Diet (NPO for surgery to be advanced. )  AMBULATORY STATUS COMMUNICATION OF NEEDS Skin   Extensive Assist Verbally Surgical wounds                       Personal Care Assistance Level of  Assistance  Bathing, Feeding, Dressing Bathing Assistance: Limited assistance Feeding assistance: Independent Dressing Assistance: Limited assistance     Functional Limitations Info  Sight, Hearing, Speech Sight Info: Adequate Hearing Info: Adequate Speech Info: Adequate    SPECIAL CARE FACTORS FREQUENCY  PT (By licensed PT), OT (By licensed OT)     PT Frequency:  (5) OT Frequency:  (5)            Contractures      Additional Factors Info  Code Status, Allergies Code Status Info:  (Full Code. ) Allergies Info:  (No Known Allergies. )           Current Medications (09/10/2016):  This is the current hospital active medication list Current Facility-Administered Medications  Medication Dose Route Frequency Provider Last Rate Last Dose  . 0.9 %  sodium chloride infusion   Intravenous Continuous Katha Hamming, MD 100 mL/hr at 09/10/16 0858    . acetaminophen (TYLENOL) tablet 650 mg  650 mg Oral Q6H PRN Katha Hamming, MD      . aspirin EC tablet 81 mg  81 mg Oral Daily Katha Hamming, MD      . bisacodyl (DULCOLAX) EC tablet 5 mg  5 mg Oral Daily PRN Katha Hamming, MD      . busPIRone (BUSPAR) tablet 15 mg  15 mg Oral Daily Katha Hamming, MD      . calcium-vitamin D (OSCAL WITH D) 500-200 MG-UNIT per tablet 1 tablet  1 tablet Oral Daily Katha Hamming, MD      . [  START ON 09/11/2016] citalopram (CELEXA) tablet 10 mg  10 mg Oral Kate SableQODAY Konidena, Snehalatha, MD      . docusate sodium (COLACE) capsule 100 mg  100 mg Oral BID Katha HammingKonidena, Snehalatha, MD      . donepezil (ARICEPT) tablet 5 mg  5 mg Oral Daily Katha HammingKonidena, Snehalatha, MD      . fentaNYL (SUBLIMAZE) 100 MCG/2ML injection           . heparin injection 5,000 Units  5,000 Units Subcutaneous Q8H Katha HammingKonidena, Snehalatha, MD      . HYDROcodone-acetaminophen (NORCO/VICODIN) 5-325 MG per tablet 1-2 tablet  1-2 tablet Oral Q4H PRN Katha HammingKonidena, Snehalatha, MD   2 tablet at 09/10/16 0921  .  levothyroxine (SYNTHROID, LEVOTHROID) tablet 75 mcg  75 mcg Oral QAC breakfast Katha HammingKonidena, Snehalatha, MD      . mirabegron ER (MYRBETRIQ) tablet 25 mg  25 mg Oral Daily Katha HammingKonidena, Snehalatha, MD      . morphine 2 MG/ML injection 2 mg  2 mg Intravenous Q4H PRN Katha HammingKonidena, Snehalatha, MD      . ondansetron (ZOFRAN) tablet 4 mg  4 mg Oral Q6H PRN Katha HammingKonidena, Snehalatha, MD       Or  . ondansetron (ZOFRAN) injection 4 mg  4 mg Intravenous Q6H PRN Katha HammingKonidena, Snehalatha, MD      . traZODone (DESYREL) tablet 25 mg  25 mg Oral QHS PRN Katha HammingKonidena, Snehalatha, MD         Discharge Medications: Please see discharge summary for a list of discharge medications.  Relevant Imaging Results:  Relevant Lab Results:   Additional Information  (SSN: [id number])  Carrie Calhoun, Carrie CrockerBailey M, LCSW

## 2016-09-10 NOTE — Transfer of Care (Signed)
Immediate Anesthesia Transfer of Care Note  Patient: Carrie Calhoun  Procedure(s) Performed: Procedure(s): INTRAMEDULLARY (IM) NAIL INTERTROCHANTRIC (Right)  Patient Location: PACU  Anesthesia Type:Spinal  Level of Consciousness: awake, alert , oriented and patient cooperative  Airway & Oxygen Therapy: Patient Spontanous Breathing  Post-op Assessment: Report given to RN and Post -op Vital signs reviewed and stable  Post vital signs: Reviewed and stable  Last Vitals:  Vitals:   09/10/16 1104 09/10/16 1527  BP: (!) 122/49 (!) 133/56  Pulse: (!) 54 65  Resp:    Temp: 36.4 C 36.9 C    Last Pain:  Vitals:   09/10/16 1527  TempSrc: Oral  PainSc:          Complications: No apparent anesthesia complications

## 2016-09-10 NOTE — Op Note (Signed)
09/10/2016  8:44 PM  PATIENT:  Carrie Calhoun    PRE-OPERATIVE DIAGNOSIS:  Right comminuted intertrochanteric hip fracture  POST-OPERATIVE DIAGNOSIS:  Same  PROCEDURE:  INTRAMEDULLARY (IM) NAIL RIGHT INTERTROCHANTRIC HIP FRACTURE  SURGEON:  Juanell FairlyKRASINSKI, Tasheika Kitzmiller, MD  ANESTHESIA:   General  IMPLANTS:  Biomet Affixus 9 x 320 mm right intramedullary rod with 90 mm lag screw and 42 mm distal interlocking screw  PREOPERATIVE INDICATIONS:  Carrie Calhoun is a  83 y.o. female with a diagnosis of adisplaced comminuted intertrochanteric hip fracture.  She required surgical intervention given the displacement of the fracture. I recommended intramedullary fixation.  The risks, benefits and alternatives were discussed with the patient and their family.  The risks include but are not limited to infection, bleeding, nerve or blood vessel injury, malunion, nonunion, hardware prominence, hardware failure, change in leg lengths or lower extremity rotation need for further surgery including hardware removal with conversion to a total hip arthroplasty. Medical risks include but are not limited to DVT and pulmonary embolism, myocardial infarction, stroke, pneumonia, respiratory failure and death. The patient and their family understood these risks and wished to proceed with surgery.  OPERATIVE PROCEDURE:  The patient was brought to the operating room and placed in the supine position on the fracture table. General anesthesia was administered.  A closed reduction was performed under C-arm guidance.  The fracture reduction was confirmed on both AP and lateral views. A time out was performed to verify the patient's name, date of birth, medical record number, correct site of surgery correct procedure to be performed. The timeout was also used to verify the patient received antibiotics and all appropriate instruments, implants and radiographic studies were available in the room. Once all in attendance were in  agreement, the case began. The patient was prepped and draped in a sterile fashion. She received preoperative antibiotics with Ancef.  An incision was made proximal to the greater trochanter in line with the femur. A guidewire was placed over the tip of the greater trochanter and advanced into the proximal femur to the level of the lesser trochanter.  Confirmation of the drill pin position was made on AP and lateral C-arm images.  The threaded guidepin was then overdrilled with the proximal femoral drill.  A ball-tipped guidewire was then advanced across the fracture and down the femoral shaft to the knee. Its position at both the hip and the knee was confirmed on C-arm imaging. A depth gauge was used to measure the length of the long nail to be used. It was measured to be 320 mm. The nail was then inserted into the proximal femur, across the fracture site and down the femoral shaft. Its position was confirmed on AP and lateral C-arm images.   Once the nail was completely seated, the drill guide for the lag screw was placed through the guide arm for the Affixus nail. A guidepin was then placed through this drill guide and advanced through the lateral cortex of the femur, across the fracture site and into the femoral head achieving a tip apex distance of less than 25 mm. The length of the drill pin was measured to 90 mm, and then the drill for the lag screw was advanced through the lateral cortex, across the fracture site and up into the femoral head to the depth of the lag screw.  The 90 mm lag screw was then advanced by hand into position across the fracture site into the femoral head. Its final position was  confirmed on AP and lateral C-arm images. Compression was applied as traction was carefully released. The set screw in the top of the intramedullary rod was tightened by hand using a screwdriver. It was backed off a quarter turn to allow for compression at the fracture site.  The attention was then turned  to placement of the distal interlocking screws. A perfect circle technique was used. A single small stab incision was made over the dynamic distal interlocking screw hole. A free hand technique was used to drill the distal interlocking screw. The depth of the screw was measured with a depth gauge. A 42 mm screw was then advanced into position and tightened by hand. Final C-arm images of the entire intramedullary construct were taken in both the AP and lateral planes.   The wounds were irrigated copiously and closed with 0 Vicryl for closure of the deep fascia and 2-0 Vicryl for subcutaneous closure. The skin was approximated with staples. A dry sterile dressing was applied. I was scrubbed and present the entire case and all sharp and instrument counts were correct at the conclusion of the case. Patient was transferred to hospital bed and brought to PACU in stable condition. I spoke with patient's family in the surgical waiting room to let them know the case was performed without complication and the patient was stable in the recovery room.     Kathreen Devoid, MD

## 2016-09-10 NOTE — Clinical Social Work Placement (Signed)
   CLINICAL SOCIAL WORK PLACEMENT  NOTE  Date:  09/10/2016  Patient Details  Name: Carrie Calhoun MRN: [id number] Date of Birth: 08/13/1930  Clinical Social Work is seeking post-discharge placement for this patient at the Skilled  Nursing Facility level of care (*CSW will initial, date and re-position this form in  chart as items are completed):  Yes   Patient/family provided with [place] Clinical Social Work Department's list of facilities offering this level of care within the geographic area requested by the patient (or if unable, by the patient's family).  Yes   Patient/family informed of their freedom to choose among providers that offer the needed level of care, that participate in Medicare, Medicaid or managed care program needed by the patient, have an available bed and are willing to accept the patient.  Yes   Patient/family informed of [facility]'s ownership interest in [facility] and [facility], as well as of the fact that they are under no obligation to receive care at these facilities.  PASRR submitted to EDS on       PASRR number received on       Existing PASRR number confirmed on 09/10/16     FL2 transmitted to all facilities in geographic area requested by pt/family on 09/10/16     FL2 transmitted to all facilities within larger geographic area on       Patient informed that his/her managed care company has contracts with or will negotiate with certain facilities, including the following:            Patient/family informed of bed offers received.  Patient chooses bed at       Physician recommends and patient chooses bed at      Patient to be transferred to   on  .  Patient to be transferred to facility by       Patient family notified on   of transfer.  Name of family member notified:        PHYSICIAN       Additional Comment:    _______________________________________________ Anthonette Lesage, Darleen CrockerBailey M, LCSW 09/10/2016, 11:10 AM

## 2016-09-10 NOTE — H&P (Signed)
[facility] [place] [facility]   PATIENT NAME: Carrie Calhoun    MR#:  [id number]  DATE OF BIRTH:  09-19-1930  DATE OF ADMISSION:  09/10/2016  PRIMARY CARE PHYSICIAN: Lynnea FerrierKlein, Bert J III, MD   REQUESTING/REFERRING PHYSICIAN: Dr. Cyril LoosenKinner  CHIEF COMPLAINT:fall   Chief Complaint  Patient presents with  . Hip Pain    HISTORY OF PRESENT ILLNESS:  Carrie Calhoun  is a 83 y.o. female with a known history of Anxiety, CK D stage III comes from [facility] because of the fall due to balance problem today, noted to have severe right hip pain and found to have a right hip fracture.  PAST MEDICAL HISTORY:   Past Medical History:  Diagnosis Date  . Anxiety   . Atrioventricular block   . Cardiac arrest (HCC)   . CKD (chronic kidney disease) stage 3, GFR 30-59 ml/min   . Hypertension   . Renal disorder     PAST SURGICAL HISTOIRY:   Past Surgical History:  Procedure Laterality Date  . APPENDECTOMY    . CATARACT EXTRACTION    . TUBAL LIGATION      SOCIAL HISTORY:   Social History  Substance Use Topics  . Smoking status: Never Smoker  . Smokeless tobacco: Never Used  . Alcohol use No    FAMILY HISTORY:   Family History  Problem Relation Age of Onset  . Hypertension Mother   . Stroke Mother   . Breast cancer Maternal Aunt     DRUG ALLERGIES:  No Known Allergies  REVIEW OF SYSTEMS:  CONSTITUTIONAL:Complains of severe pain in the right hip with minimal ambulation. EYES: No blurred or double vision.  EARS, NOSE, AND THROAT: No tinnitus or ear pain.  RESPIRATORY: No cough, shortness of breath, wheezing or hemoptysis.  CARDIOVASCULAR: No chest pain, orthopnea, edema.  GASTROINTESTINAL: No nausea, vomiting, diarrhea or abdominal pain.  GENITOURINARY: No dysuria, hematuria.  ENDOCRINE: No polyuria, nocturia,  HEMATOLOGY: No anemia, easy bruising or bleeding SKIN: No rash or lesion. MUSCULOSKELETAL:  Right Hip  pain. NEUROLOGIC: No tingling, numbness, weakness.  PSYCHIATRY: anxiety.  MEDICATIONS AT HOME:   Prior to Admission medications   Medication Sig Start Date End Date Taking? Authorizing Provider  acetaminophen (TYLENOL) 325 MG tablet Take 2 tablets (650 mg total) by mouth every 6 (six) hours as needed for mild pain (or Fever >/= 101). 07/08/16   Milagros LollSudini, Srikar, MD  aspirin EC 81 MG tablet Take 1 tablet by mouth daily.    [provider]  busPIRone (BUSPAR) 15 MG tablet Take 1 tablet by mouth daily. 05/13/16   [provider]  Calcium Carbonate-Vitamin D 600-400 MG-UNIT tablet Take 1 tablet by mouth daily.    [provider]  citalopram (CELEXA) 10 MG tablet Take 1 tablet (10 mg total) by mouth every other day. 07/08/16   Milagros LollSudini, Srikar, MD  donepezil (ARICEPT) 5 MG tablet Take 1 tablet by mouth daily. 05/13/16   [provider]  levothyroxine (SYNTHROID, LEVOTHROID) 75 MCG tablet Take 1 tablet by mouth daily before breakfast.  03/11/16   [provider]  Lifitegrast 5 % SOLN Place 1 drop into both eyes 2 (two) times daily.     [provider]  mirabegron ER (MYRBETRIQ) 25 MG TB24 tablet Take 1 tablet (25 mg total) by mouth daily. 09/02/16   Michiel CowboyMcGowan, Shannon A, PA-C  Multiple Vitamins-Minerals (HAIR/SKIN/NAILS/BIOTIN PO) Take 1 tablet by mouth daily.    [provider]  Omega-3 Fatty Acids (FISH OIL PO) Take 1 tablet by mouth daily.    [provider]  oxybutynin (DITROPAN) 5 MG tablet  08/16/16   [provider]      VITAL SIGNS:  Blood pressure (!) 163/67, pulse 60, temperature 97.9 F (36.6 C), resp. rate 17, height 5' (1.524 m), weight 52.2 kg (115 lb), SpO2 100 %.  PHYSICAL EXAMINATION:  GENERAL:  83 y.o.-year-old patient lying in the bed with no acute distress.  EYES: Pupils equal, round, reactive to light and accommodation. No scleral icterus. Extraocular muscles intact.  HEENT: Head atraumatic, normocephalic.  Oropharynx and nasopharynx clear.  NECK:  Supple, no jugular venous distention. No thyroid enlargement, no tenderness.  LUNGS: Normal breath sounds bilaterally, no wheezing, rales,rhonchi or crepitation. No use of accessory muscles of respiration.  CARDIOVASCULAR: S1, S2 normal. No murmurs, rubs, or gallops.  ABDOMEN: Soft, nontender, nondistended. Bowel sounds present. No organomegaly or mass.  EXTREMITIES: Obvious deformity of the right hip, pain with even with slight range of motion, right leg is short, externally rotated.DP bilaterally. NEUROLOGIC: Cranial nerves II through XII are intact. Muscle strength 5/5 in all extremities. Sensation intact. Gait not checked.  PSYCHIATRIC: The patient is alert and oriented x 3.  SKIN: No obvious rash, lesion, or ulcer.   LABORATORY PANEL:   CBC  Recent Labs Lab 09/10/16 0715  WBC 11.2*  HGB 10.8*  HCT 32.6*  PLT 254   ------------------------------------------------------------------------------------------------------------------  Chemistries   Recent Labs Lab 09/10/16 0715  NA 139  K 4.2  CL 103  CO2 28  GLUCOSE 121*  BUN 32*  CREATININE 1.27*  CALCIUM 9.3   ------------------------------------------------------------------------------------------------------------------  Cardiac Enzymes No results for input(s): TROPONINI in the last 168 hours. ------------------------------------------------------------------------------------------------------------------  RADIOLOGY:  Dg Chest Portable 1 View  Result Date: 09/10/2016 CLINICAL DATA:  Pain following fall EXAM: PORTABLE CHEST 1 VIEW COMPARISON:  August 11, 2016 FINDINGS: There is mild scarring in the left base. There is no edema or consolidation. Heart is mildly enlarged with pulmonary vascularity within normal limits. No adenopathy. No pneumothorax. Bones are osteoporotic. IMPRESSION: Mild scarring left base. No edema or consolidation. Stable cardiac prominence. There is aortic  atherosclerosis. Aortic Atherosclerosis (ICD10-I70.0). Electronically Signed   By: Bretta Bang III M.D.   On: 09/10/2016 08:25   Dg Hip Unilat With Pelvis 2-3 Views Right  Result Date: 09/10/2016 CLINICAL DATA:  Pain following fall EXAM: DG HIP (WITH OR WITHOUT PELVIS) 2-3V RIGHT COMPARISON:  None. FINDINGS: Frontal pelvis as well as frontal and lateral right hip images were obtained. There is a comminuted intertrochanteric femur fracture on the right with varus angulation the fracture site. There is mild impaction at the fracture site. There is avulsion medially of the lesser trochanter. No other fracture. No dislocation. There is slight symmetric narrowing of both hip joints. Bones are osteoporotic. IMPRESSION: Comminuted fracture intertrochanteric region on the right with medial displacement of the lesser trochanter. Mild varus angulation at fracture site. No other fractures. No dislocations. Mild symmetric narrowing of both hip joints. Bones are osteoporotic. Electronically Signed   By: Bretta Bang III M.D.   On: 09/10/2016 08:24    EKG:   Orders placed or performed during the hospital encounter of 08/11/16  . ED EKG  . ED EKG  . EKG 12-Lead  . EKG 12-Lead  . EKG   Sinus rhythm at 53 bpm, no ST T changes. IMPRESSION AND PLAN:   83 year old female patient with a right hip  fracture due to fall. Admitted to medical floor, start IV pain medicines, IV hydration, patient is at moderate risk for the right hip repair because of advanced age, dementia. Dr. Martha Clan informed by ER physician. Continue DVT prophylaxis. #2 .history of overactive bladder, started on \ myrbetriq by urologist recently.  #3 CK distress 3: Kidney function is stable. #4 dementia, anxiety; continue aricept, Celexa, BuSpar. Hypothyroidism: Continue Synthroid. All the records are reviewed and case discussed with ED provider. Management plans discussed with the patient, family and they are in agreement.  CODE  STATUS: DNR  TOTAL TIME TAKING CARE OF THIS PATIENT: 55 minutes.    Katha Hamming M.D on 09/10/2016 at 9:13 AM  Between 7am to 6pm - Pager - [phone]  After 6pm go to [email or web address] - password [email or web address] [facility]  [facility]  Office  [phone]  CC: Primary care physician; Lynnea Ferrier, MD  Note: This dictation was prepared with Dragon dictation along with smaller phrase technology. Any transcriptional errors that result from this process are unintentional.

## 2016-09-10 NOTE — Anesthesia Post-op Follow-up Note (Cosign Needed)
Anesthesia QCDR form completed.        

## 2016-09-10 NOTE — Consult Note (Signed)
ORTHOPAEDIC CONSULTATION  REQUESTING PHYSICIAN: Carrie Calhoun, Snehalatha, MD  Chief Complaint: Right intertrochanteric hip fracture  HPI: Carrie Calhoun is a 83 y.o. female who complains of right hip pain status post fall at her nursing facility. Incision states that she lost her balance while getting dressed. She had pain and was unable to stand or ambulate after falling. X-[facility] and [facility] demonstrate an intertrochanteric hip fracture.  Past Medical History:  Diagnosis Date  . Anxiety   . Atrioventricular block   . Cardiac arrest (HCC)   . CKD (chronic kidney disease) stage 3, GFR 30-59 ml/min   . Hypertension   . Renal disorder    Past Surgical History:  Procedure Laterality Date  . APPENDECTOMY    . CATARACT EXTRACTION    . TUBAL LIGATION     Social History   Social History  . Marital status: Widowed    Spouse name: N/A  . Number of children: N/A  . Years of education: N/A   Social History Main Topics  . Smoking status: Never Smoker  . Smokeless tobacco: Never Used  . Alcohol use No  . Drug use: No  . Sexual activity: Not Currently   Other Topics Concern  . None   Social History Narrative  . None   Family History  Problem Relation Age of Onset  . Hypertension Mother   . Stroke Mother   . Breast cancer Maternal Aunt    No Known Allergies Prior to Admission medications   Medication Sig Start Date End Date Taking? Authorizing Provider  aspirin EC 81 MG tablet Take 1 tablet by mouth daily.   Yes [provider]  busPIRone (BUSPAR) 15 MG tablet Take 1 tablet by mouth daily. 05/13/16  Yes [provider]  Calcium Carbonate-Vitamin D 600-400 MG-UNIT tablet Take 1 tablet by mouth daily.   Yes [provider]  citalopram (CELEXA) 10 MG tablet Take 1 tablet (10 mg total) by mouth every other day. 07/08/16  Yes Sudini, Wardell HeathSrikar, MD  donepezil (ARICEPT) 5 MG tablet Take 1 tablet by mouth daily. 05/13/16  Yes [provider]  levothyroxine (SYNTHROID, LEVOTHROID) 75 MCG tablet Take 1 tablet by mouth daily before breakfast.  03/11/16  Yes [provider]  Lifitegrast 5 % SOLN Place 1 drop into both eyes 2 (two) times daily.    Yes [provider]  mirabegron ER (MYRBETRIQ) 25 MG TB24 tablet Take 1 tablet (25 mg total) by mouth daily. 09/02/16  Yes McGowan, Carollee HerterShannon A, PA-C  Multiple Vitamins-Minerals (HAIR/SKIN/NAILS/BIOTIN PO) Take 1 tablet by mouth daily.   Yes [provider]  Omega-3 Fatty Acids (FISH OIL PO) Take 1 tablet by mouth daily.   Yes [provider]  oxybutynin (DITROPAN) 5 MG tablet  08/16/16  Yes [provider]  acetaminophen (TYLENOL) 325 MG tablet Take 2 tablets (650 mg total) by mouth every 6 (six) hours as needed for mild pain (or Fever >/= 101). 07/08/16   Milagros LollSudini, Srikar, MD   Dg Chest Portable 1 View  Result Date: 09/10/2016 CLINICAL DATA:  Pain following fall EXAM: PORTABLE CHEST 1 VIEW COMPARISON:  August 11, 2016 FINDINGS: There is mild scarring in the left base. There is no edema or consolidation. Heart is mildly enlarged with pulmonary vascularity within normal limits. No adenopathy. No pneumothorax. Bones are osteoporotic. IMPRESSION: Mild scarring left base. No edema or consolidation. Stable cardiac prominence. There is aortic atherosclerosis. Aortic Atherosclerosis (ICD10-I70.0). Electronically Signed   By: Chrissie NoaWilliam  Margarita Grizzle III M.D.   On: 09/10/2016 08:25   Dg Hip Unilat With Pelvis 2-3 Views Right  Result Date: 09/10/2016 CLINICAL DATA:  Pain following fall EXAM: DG HIP (WITH OR WITHOUT PELVIS) 2-3V RIGHT COMPARISON:  None. FINDINGS: Frontal pelvis as well as frontal and lateral right hip images were obtained. There is a comminuted intertrochanteric femur fracture on the right with varus angulation the fracture site. There is mild impaction at the fracture site. There is avulsion medially of the lesser trochanter. No other fracture. No  dislocation. There is slight symmetric narrowing of both hip joints. Bones are osteoporotic. IMPRESSION: Comminuted fracture intertrochanteric region on the right with medial displacement of the lesser trochanter. Mild varus angulation at fracture site. No other fractures. No dislocations. Mild symmetric narrowing of both hip joints. Bones are osteoporotic. Electronically Signed   By: Bretta Bang III M.D.   On: 09/10/2016 08:24    Positive ROS: All other systems have been reviewed and were otherwise negative with the exception of those mentioned in the HPI and as above.  Physical Exam: General: Alert, no acute distress  MUSCULOSKELETAL: Right hip:  Patient has shortening and external rotation of the right lower extremity. Her skin is intact. She has palpable pedal pulses, intact sensation light touch and intact motor function.  Assessment: Displaced right intertrochanteric hip fracture  Plan: I spoke with the patient and her sons were in the room with her. I explained to them the injury. I recommended intramedullary fixation for this injury. I explained the surgery in detail as well as the postoperative course.  I also cemented in the wrist and benefits of surgery. They understand the risks include infection, bleeding, nerve or blood vessel injury, malunion, nonunion, persistent right hip pain, hardware failure and the need for further surgery.  The risks include but are not limited to DVT and pulmonary embolism,: Collection, stroke, pneumonia, respiratory failure and death.  Patient has been cleared for surgery by the hospitalist service and was deemed to moderate risk based on her age. I reviewed the labs and regular studies in preparation for this case. Surgery is scheduled for later today.    [name], MD    09/10/2016 1:14 PM

## 2016-09-10 NOTE — Progress Notes (Signed)
Clinical Child psychotherapistocial Worker (CSW) presented bed offers to patient and her sons. They [facility]. CSW will send [facility] PT notes once available so [place] authorization can be started. [facility] is aware of above. CSW will continue to follow and assist as needed.   [employer]Bailey Aadin Gaut, LCSW [phone]

## 2016-09-10 NOTE — ED Notes (Signed)
Pt with o2 sats 82% RA following pain meds. Pt encouraged to take deep breaths and 2 LNC applied

## 2016-09-10 NOTE — ED Provider Notes (Signed)
[facility] Emergency Department Provider Note   ____________________________________________    I have reviewed the triage vital signs and the nursing notes.   HISTORY  Chief Complaint Fall    HPI Carrie Calhoun is a 83 y.o. female who presents after a fall. Patient reports she lost her balance and fell onto her right side. She complains of severe right hip pain. She had to crawl across the room to call for help, [facility] is [facility]. She reports this happened approximately 2 hours ago. She describes the pain as severe and stabbing in the right hip. She denies other injuries. No back pain no chest pain no palpitations no abdominal pain. She denies a history of blood thinners   Past Medical History:  Diagnosis Date  . Anxiety   . Atrioventricular block   . Cardiac arrest (HCC)   . CKD (chronic kidney disease) stage 3, GFR 30-59 ml/min   . Hypertension   . Renal disorder     Patient Active Problem List   Diagnosis Date Noted  . Osteopenia 09/02/2016  . Hyperlipidemia, unspecified 09/02/2016  . DDD (degenerative disc disease), lumbar 09/02/2016  . CKD (chronic kidney disease), stage III 09/02/2016  . Anemia, unspecified 09/02/2016  . Ataxia 07/22/2016  . Dementia arising in the senium and presenium 07/15/2016  . Multiple rib fractures 07/04/2016  . Moderate major depression, single episode (HCC) 06/14/2016  . Difficulty walking 01/08/2016  . Hypothyroidism due to acquired atrophy of thyroid 07/21/2015  . Paresthesia of both hands 06/28/2014  . Bilateral carpal tunnel syndrome 06/28/2014  . Tremor 12/28/2013  . Restless legs 12/28/2013  . Loss of memory 12/28/2013  . Essential hypertension 12/28/2013    Past Surgical History:  Procedure Laterality Date  . APPENDECTOMY    . CATARACT EXTRACTION    . TUBAL LIGATION      Prior to Admission medications   Medication Sig Start Date End Date Taking? Authorizing Provider    acetaminophen (TYLENOL) 325 MG tablet Take 2 tablets (650 mg total) by mouth every 6 (six) hours as needed for mild pain (or Fever >/= 101). 07/08/16   Milagros Loll, MD  aspirin EC 81 MG tablet Take 1 tablet by mouth daily.    [provider]  busPIRone (BUSPAR) 15 MG tablet Take 1 tablet by mouth daily. 05/13/16   [provider]  Calcium Carbonate-Vitamin D 600-400 MG-UNIT tablet Take 1 tablet by mouth daily.    [provider]  citalopram (CELEXA) 10 MG tablet Take 1 tablet (10 mg total) by mouth every other day. 07/08/16   Milagros Loll, MD  donepezil (ARICEPT) 5 MG tablet Take 1 tablet by mouth daily. 05/13/16   [provider]  levothyroxine (SYNTHROID, LEVOTHROID) 75 MCG tablet Take 1 tablet by mouth daily before breakfast.  03/11/16   [provider]  Lifitegrast 5 % SOLN Place 1 drop into both eyes 2 (two) times daily.     [provider]  mirabegron ER (MYRBETRIQ) 25 MG TB24 tablet Take 1 tablet (25 mg total) by mouth daily. 09/02/16   Michiel Cowboy A, PA-C  Multiple Vitamins-Minerals (HAIR/SKIN/NAILS/BIOTIN PO) Take 1 tablet by mouth daily.    [provider]  Omega-3 Fatty Acids (FISH OIL PO) Take 1 tablet by mouth daily.    [provider]  oxybutynin (DITROPAN) 5 MG tablet  08/16/16   [provider]     Allergies Patient has no known allergies.  Family  History  Problem Relation Age of Onset  . Hypertension Mother   . Stroke Mother   . Breast cancer Maternal Aunt     Social History Social History  Substance Use Topics  . Smoking status: Never Smoker  . Smokeless tobacco: Never Used  . Alcohol use No    Review of Systems  Constitutional: No fever/chills Eyes: No visual changes.  ENT: NoNeck pain Cardiovascular: Denies chest pain. Respiratory: Denies shortness of breath. Gastrointestinal: No abdominal pain.  No nausea, no vomiting.   Genitourinary: Negative for dysuria. Musculoskeletal:  Negative for back pain. Right hip pain as above Skin: Negative for rash. Neurological: Negative for headaches  ____________________________________________   PHYSICAL EXAM:  VITAL SIGNS: ED Triage Vitals  Enc Vitals Group     BP      Pulse      Resp      Temp      Temp src      SpO2      Weight      Height      Head Circumference      Peak Flow      Pain Score      Pain Loc      Pain Edu?      Excl. in GC?     Constitutional: Alert and oriented. No acute distress.  Eyes: Conjunctivae are normal.  Head: Atraumatic. Nose: No congestion/rhinnorhea. Mouth/Throat: Mucous membranes are moist.   Neck:  Painless ROM, No vertebral tenderness to palpation Cardiovascular: Normal rate, regular rhythm. Grossly normal heart sounds.  Good peripheral circulation. Respiratory: Normal respiratory effort.  No retractions. Lungs CTAB. Gastrointestinal: Soft and nontender. No distention.  No CVA tenderness. Genitourinary: deferred Musculoskeletal: Obvious deformity right anterior proximal femur. Pain with any attempted range of motion of right leg. Right leg is shortened and externally rotated Warm and well perfused distally, 2+ DP pulses Neurologic:  Normal speech and language. No gross focal neurologic deficits are appreciated.  Skin:  Skin is warm, dry and intact. No rash noted. Psychiatric: Mood and affect are normal. Speech and behavior are normal.  ____________________________________________   LABS (all labs ordered are listed, but only abnormal results are displayed)  Labs Reviewed  CBC WITH DIFFERENTIAL/PLATELET - Abnormal; Notable for the following:       Result Value   WBC 11.2 (*)    RBC 3.46 (*)    Hemoglobin 10.8 (*)    HCT 32.6 (*)    Neutro Abs 9.2 (*)    All other components within normal limits  BASIC METABOLIC PANEL - Abnormal; Notable for the following:    Glucose, Bld 121 (*)    BUN 32 (*)    Creatinine, Ser 1.27 (*)    GFR calc non Af Amer 38 (*)    GFR  calc Af Amer 44 (*)    All other components within normal limits  PROTIME-INR  TYPE AND SCREEN  TYPE AND SCREEN   ____________________________________________  EKG  ED ECG REPORT I, Jene Every, the attending physician, personally viewed and interpreted this ECG.  Date: 09/10/2016 EKG Time: 7:15 AM Rate: 53 Rhythm: normal sinus rhythm QRS Axis: Left axis deviation Intervals: normal ST/T Wave abnormalities: normal   ____________________________________________  RADIOLOGY  Right hip x-rays pending ____________________________________________   PROCEDURES  Procedure(s) performed: No    Critical Care performed: No ____________________________________________   INITIAL IMPRESSION / ASSESSMENT AND PLAN / ED COURSE  Pertinent labs & imaging results that were available during my care of  the patient were reviewed by me and considered in my medical decision making (see chart for details).  Patient presents after a fall with isolated right hip pain. She has a clear deformity there, her hip is externally rotated and shortened consistent with a fracture. Patient given 50 micrograms of fentanyl for analgesia.    ____________________________________________   FINAL CLINICAL IMPRESSION(S) / ED DIAGNOSES  Final diagnoses:  Closed fracture of right hip, initial encounter (HCC)      NEW MEDICATIONS STARTED DURING THIS VISIT:  New Prescriptions   No medications on file     Note:  This document was prepared using Dragon voice recognition software and may include unintentional dictation errors.    Jene EveryKinner, Aizik Reh, MD 09/10/16 [phone]

## 2016-09-10 NOTE — ED Triage Notes (Signed)
Ems from [facility] s/p fall with right hip pain, shortening, swelling. +2 pedal pulses.

## 2016-09-10 NOTE — Clinical Social Work Note (Signed)
Clinical Social Work Assessment  Patient Details  Name: Carrie Calhoun MRN: [id number] Date of Birth: 04/03/1930  Date of referral:  09/10/16               Reason for consult:  Facility Placement, Other (Comment Required) (From [facility] )                Permission sought to share information with:  Chartered certified accountant granted to share information::  Yes, Verbal Permission Granted  Name::      [place]::   [place]   Relationship::     Contact Information:     Housing/Transportation Living arrangements for the past 2 months:  [place], [place] of Information:  Adult Children Patient Interpreter Needed:  None Criminal Activity/Legal Involvement Pertinent to Current Situation/Hospitalization:  No - Comment as needed Significant Relationships:  Adult Children Lives with:  Facility Resident Do you feel safe going back to the place where you live?  Yes Need for family participation in patient care:  Yes (Comment)  Care giving concerns:  Patient is a resident at [facility].    Social Worker assessment / plan:  Holiday representative (CSW) reviewed chart and noted that patient is from [facility]. CSW met with patient's sons Jenny Reichmann [phone] and Fritz Pickerel [phone] and patient's daughter in law outside of patient's room. CSW introduced self and explained role of CSW department. Per sons patient has 4 adult children, 3 sons and 1 daughter Arbie Cookey [phone]. Per adult children son Jenny Reichmann and daughter Arbie Cookey share [place] for patient. Per adult children patient has been at [facility] since May 2018 and was at [facility] before that with rib fractures. CSW explained that patient will likely need to go to SNF for short term rehab before returning to [facility]. CSW explained that Mcarthur Rossetti will have to approve SNF. Patient's sons verbalized their understanding and are agreeable to SNF  search in [facility] and prefer [facility] FL2 complete and faxed out. CSW will continue to follow and assist as needed.   Employment status:  Retired Nurse, adult PT Recommendations:  Not assessed at this time [place] / Referral to community resources:  [place]  Patient/Family's Response to care:  Patient's family is agreeable to [facility] in [place].   Patient/Family's Understanding of and Emotional Response to Diagnosis, Current Treatment, and Prognosis:  Patient's family was very pleasant and thanked CSW for assistance.   Emotional Assessment Appearance:  Appears stated age Attitude/Demeanor/Rapport:  Unable to Assess Affect (typically observed):  Unable to Assess Orientation:  Oriented to Self, Oriented to Place, Fluctuating Orientation (Suspected and/or reported Sundowners) Alcohol / Substance use:  Not Applicable Psych involvement (Current and /or in the community):  No (Comment)  Discharge Needs  Concerns to be addressed:  Discharge Planning Concerns Readmission within the last 30 days:  No Current discharge risk:  Dependent with Mobility Barriers to Discharge:  Continued Medical Work up   [employer], Veronia Beets, LCSW 09/10/2016, 11:11 AM

## 2016-09-10 NOTE — ED Notes (Signed)
Pt states that she brought her wallet to hospital. Soiled clothing checked, sheets and bedding checked, bed changed and checked, pt belonging checked, trash can and linen basket checked. No wallet found. Called [facility] and they are going to look for it and call me back.

## 2016-09-11 ENCOUNTER — Encounter: Payer: Self-pay | Admitting: Orthopedic Surgery

## 2016-09-11 LAB — BASIC METABOLIC PANEL
ANION GAP: 4 — AB (ref 5–15)
BUN: 25 mg/dL — ABNORMAL HIGH (ref 6–20)
CHLORIDE: 106 mmol/L (ref 101–111)
CO2: 28 mmol/L (ref 22–32)
Calcium: 8.2 mg/dL — ABNORMAL LOW (ref 8.9–10.3)
Creatinine, Ser: 1.24 mg/dL — ABNORMAL HIGH (ref 0.44–1.00)
GFR, EST AFRICAN AMERICAN: 45 mL/min — AB (ref >60)
GFR, EST NON AFRICAN AMERICAN: 39 mL/min — AB (ref >60)
Glucose, Bld: 112 mg/dL — ABNORMAL HIGH (ref 65–99)
POTASSIUM: 4.2 mmol/L (ref 3.5–5.1)
SODIUM: 138 mmol/L (ref 135–145)

## 2016-09-11 LAB — HEMOGLOBIN AND HEMATOCRIT, BLOOD
HCT: 26.6 % — ABNORMAL LOW (ref 35.0–47.0)
HEMATOCRIT: 21.9 % — AB (ref 35.0–47.0)
HEMOGLOBIN: 9.1 g/dL — AB (ref 12.0–16.0)
Hemoglobin: 7.4 g/dL — ABNORMAL LOW (ref 12.0–16.0)

## 2016-09-11 LAB — CBC
HCT: 22.6 % — ABNORMAL LOW (ref 35.0–47.0)
HEMOGLOBIN: 7.4 g/dL — AB (ref 12.0–16.0)
MCH: 31.2 pg (ref 26.0–34.0)
MCHC: 32.8 g/dL (ref 32.0–36.0)
MCV: 94.9 fL (ref 80.0–100.0)
PLATELETS: 171 10*3/uL (ref 150–440)
RBC: 2.38 MIL/uL — AB (ref 3.80–5.20)
RDW: 13.1 % (ref 11.5–14.5)
WBC: 8.7 10*3/uL (ref 3.6–11.0)

## 2016-09-11 LAB — URINALYSIS, ROUTINE W REFLEX MICROSCOPIC
BILIRUBIN URINE: NEGATIVE
Glucose, UA: NEGATIVE mg/dL
HGB URINE DIPSTICK: NEGATIVE
KETONES UR: 5 mg/dL — AB
Leukocytes, UA: NEGATIVE
NITRITE: NEGATIVE
PH: 5 (ref 5.0–8.0)
Protein, ur: NEGATIVE mg/dL
SPECIFIC GRAVITY, URINE: 1.019 (ref 1.005–1.030)

## 2016-09-11 LAB — PREPARE RBC (CROSSMATCH)

## 2016-09-11 LAB — ABO/RH: ABO/RH(D): O POS

## 2016-09-11 MED ORDER — SODIUM CHLORIDE 0.9 % IV SOLN: Freq: Once | INTRAVENOUS | Status: AC

## 2016-09-11 MED ORDER — KETOROLAC TROMETHAMINE 30 MG/ML IJ SOLN
30.0000 mg | Freq: Three times a day (TID) | INTRAMUSCULAR | Status: DC | PRN
  Administered 2016-09-11 – 2016-09-12 (×2): 30 mg via INTRAVENOUS
  Filled 2016-09-11 (×2): qty 1

## 2016-09-11 NOTE — Evaluation (Signed)
Physical Therapy Evaluation Patient Details Name: Carrie Calhoun MRN: [id number] DOB: 02-Apr-1930 Today's Date: 09/11/2016   History of Present Illness  Pt is an 83 y.o. female presenting to hospital s/p fall with R hip pain.  Pt s/p IMN for intertrochanteric hip fx 09/10/16.  PMH includes AV block, cardiac arrest, CKD, htn, h/o overactive bladder, rib fx's.  Clinical Impression  Prior to hospital admission, pt was ambulating with rollator vs no assistive device.  Pt lives at [facility].  Currently pt is 2 assist with bed mobility and standing with RW; pt unable to take any steps with RW with max cueing and assist d/t R hip pain.  Pt would benefit from skilled PT to address noted impairments and functional limitations (see below for any additional details).  Upon hospital discharge, recommend pt discharge to [facility].    Follow Up Recommendations SNF    Equipment Recommendations  Rolling walker with 5" wheels    Recommendations for Other Services       Precautions / Restrictions Precautions Precautions: Fall Restrictions Weight Bearing Restrictions: Yes RLE Weight Bearing: Weight bearing as tolerated      Mobility  Bed Mobility Overal bed mobility: Needs Assistance Bed Mobility: Supine to Sit;Sit to Supine     Supine to sit: Mod assist;Max assist;+2 for physical assistance;HOB elevated Sit to supine: Mod assist;Max assist;+2 for physical assistance;HOB elevated   General bed mobility comments: assist for trunk and B LE's supine to/from sit (increased time to perform d/t R hip pain); 2 assist to boost up in bed  Transfers Overall transfer level: Needs assistance Equipment used: Rolling walker (2 wheeled) Transfers: Sit to/from Stand Sit to Stand: Min assist;Mod assist;+2 physical assistance         General transfer comment: assist to initiate and come to full stand; vc's for hand and feet placement; pt able to stand for 2 trials (2nd trial pt stood while nursing changed  pt's sheets on bed)  Ambulation/Gait Ambulation/Gait assistance: Total assist           General Gait Details: pt unable to initiate step with either LE with max cueing and assist (limited d/t R hip pain)  Stairs            Wheelchair Mobility    Modified Rankin (Stroke Patients Only)       Balance Overall balance assessment: Needs assistance Sitting-balance support: Bilateral upper extremity supported;Feet supported Sitting balance-Leahy Scale: Fair Sitting balance - Comments: static sitting   Standing balance support: Bilateral upper extremity supported (on RW) Standing balance-Leahy Scale: Fair Standing balance comment: static standing                             Pertinent Vitals/Pain Pain Assessment: Faces Faces Pain Scale: Hurts a little bit (2/10 at rest; 6-8/10 with functional mobility) Pain Location: R hip Pain Descriptors / Indicators: Operative site guarding;Grimacing;Guarding Pain Intervention(s): Limited activity within patient's tolerance;Monitored during session;Premedicated before session;Repositioned;Ice applied  Vitals (HR and O2 on room air) stable and WFL throughout treatment session.    Home Living Family/patient expects to be discharged to:: Skilled nursing facility               Home Equipment: Dan Humphreys - 4 wheels;Shower seat;Cane - single point Additional Comments: Pt lives at [facility].    Prior Function Level of Independence: Independent with assistive device(s)         Comments: Pt ambulating with  4ww but sometimes no AD.  [name] reports pt falls at least 1x/week (2x in past week).     Hand Dominance        Extremity/Trunk Assessment   Upper Extremity Assessment Upper Extremity Assessment: Generalized weakness    Lower Extremity Assessment Lower Extremity Assessment: RLE deficits/detail;LLE deficits/detail RLE Deficits / Details: hip flexion at least 2/5 (limited d/t R hip pain); knee  flexion/extension 2+/5 (limited d/t R hip pain); DF at least 3+/5 LLE Deficits / Details: generalized weakness       Communication   Communication:  (slow to respond (pt's son reports pt is better since blood transfusion but not back to baseline yet))  Cognition Arousal/Alertness: Awake/alert Behavior During Therapy: Flat affect Overall Cognitive Status: Impaired/Different from baseline (oriented to self but appearing confused and slow to answer questions)                                        General Comments General comments (skin integrity, edema, etc.): [name] present entire session encouraging pt appropriately.  Nursing cleared pt for participation in physical therapy.  Pt and pt's son agreeable to PT session.    Exercises  Transfer training   Assessment/Plan    PT Assessment Patient needs continued PT services  PT Problem List Decreased strength;Decreased activity tolerance;Decreased balance;Decreased mobility;Decreased cognition;Decreased knowledge of use of DME;Decreased knowledge of precautions;Pain       PT Treatment Interventions DME instruction;Gait training;Functional mobility training;Therapeutic activities;Therapeutic exercise;Balance training;Patient/family education    PT Goals (Current goals can be found in the Care Plan section)  Acute Rehab PT Goals Patient Stated Goal: to be able to walk again PT Goal Formulation: With patient/family Time For Goal Achievement: 09/25/16 Potential to Achieve Goals: Good    Frequency BID   Barriers to discharge Decreased caregiver support      Co-evaluation               AM-PAC PT "6 Clicks" Daily Activity  Outcome Measure Difficulty turning over in bed (including adjusting bedclothes, sheets and blankets)?: Total Difficulty moving from lying on back to sitting on the side of the bed? : Total Difficulty sitting down on and standing up from a chair with arms (e.g., wheelchair, bedside commode,  etc,.)?: Total Help needed moving to and from a bed to chair (including a wheelchair)?: Total Help needed walking in hospital room?: Total Help needed climbing 3-5 steps with a railing? : Total 6 Click Score: 6    End of Session Equipment Utilized During Treatment: Gait belt Activity Tolerance: Patient limited by pain Patient left: in bed;with call bell/phone within reach;with bed alarm set;with family/visitor present;with SCD's reapplied (B heels elevated via towel rolls; ice packs in place on R hip) Nurse Communication: Mobility status;Precautions;Weight bearing status PT Visit Diagnosis: Other abnormalities of gait and mobility (R26.89);Muscle weakness (generalized) (M62.81);Repeated falls (R29.6);Difficulty in walking, not elsewhere classified (R26.2);Pain Pain - Right/Left: Right Pain - part of body: Hip    Time: [id number] PT Time Calculation (min) (ACUTE ONLY): 29 min   Charges:   PT Evaluation $PT Eval Low Complexity: 1 Procedure PT Treatments $Therapeutic Activity: 8-22 mins   PT G CodesHendricks Limes:        Amun Stemm, PT 09/11/16, 5:35 PM [phone]

## 2016-09-11 NOTE — Progress Notes (Signed)
Subjective:  POD #1  S/p IM fixation for right inter trochanteric fracture. Patient reports pain as mild.  Family members at the bedside helping feed her lunch.  Patient is receiving a blood transfusion.  Objective:   VITALS:   Vitals:   09/11/16 0410 09/11/16 0750 09/11/16 1107 09/11/16 1141  BP: (!) 105/50 (!) 112/56 (!) 120/56 (!) 136/58  Pulse:  75 75 74  Resp:   16   Temp:  98 F (36.7 C) 98.8 F (37.1 C) 98.8 F (37.1 C)  TempSrc:  Oral Oral Oral  SpO2:  97% 95% 97%  Weight: 53.5 kg (118 lb)     Height:        PHYSICAL EXAM:  Right lower extremity: Neurovascular intact Sensation intact distally Intact pulses distally Dorsiflexion/Plantar flexion intact Incision: dressing C/D/I No cellulitis present Compartment soft  LABS  Results for orders placed or performed during the hospital encounter of 09/10/16 (from the past 24 hour(s))  APTT     Status: None   Collection Time: 09/10/16  4:03 PM  Result Value Ref Range   aPTT 27 24 - 36 seconds  CBC     Status: Abnormal   Collection Time: 09/11/16  4:51 AM  Result Value Ref Range   WBC 8.7 3.6 - 11.0 K/uL   RBC 2.38 (L) 3.80 - 5.20 MIL/uL   Hemoglobin 7.4 (L) 12.0 - 16.0 g/dL   HCT 16.122.6 (L) 09.635.0 - 04.547.0 %   MCV 94.9 80.0 - 100.0 fL   MCH 31.2 26.0 - 34.0 pg   MCHC 32.8 32.0 - 36.0 g/dL   RDW 40.913.1 81.111.5 - 91.414.5 %   Platelets 171 150 - 440 K/uL  Basic metabolic panel     Status: Abnormal   Collection Time: 09/11/16  4:51 AM  Result Value Ref Range   Sodium 138 135 - 145 mmol/L   Potassium 4.2 3.5 - 5.1 mmol/L   Chloride 106 101 - 111 mmol/L   CO2 28 22 - 32 mmol/L   Glucose, Bld 112 (H) 65 - 99 mg/dL   BUN 25 (H) 6 - 20 mg/dL   Creatinine, Ser 7.821.24 (H) 0.44 - 1.00 mg/dL   Calcium 8.2 (L) 8.9 - 10.3 mg/dL   GFR calc non Af Amer 39 (L) >60 mL/min   GFR calc Af Amer 45 (L) >60 mL/min   Anion gap 4 (L) 5 - 15  ABO/Rh     Status: None   Collection Time: 09/11/16  4:51 AM  Result Value Ref Range   ABO/RH(D) O  POS   Urinalysis, Routine w reflex microscopic     Status: Abnormal   Collection Time: 09/11/16 10:07 AM  Result Value Ref Range   Color, Urine YELLOW (A) YELLOW   APPearance CLEAR (A) CLEAR   Specific Gravity, Urine 1.019 1.005 - 1.030   pH 5.0 5.0 - 8.0   Glucose, UA NEGATIVE NEGATIVE mg/dL   Hgb urine dipstick NEGATIVE NEGATIVE   Bilirubin Urine NEGATIVE NEGATIVE   Ketones, ur 5 (A) NEGATIVE mg/dL   Protein, ur NEGATIVE NEGATIVE mg/dL   Nitrite NEGATIVE NEGATIVE   Leukocytes, UA NEGATIVE NEGATIVE  Hemoglobin and Hematocrit     Status: Abnormal   Collection Time: 09/11/16 10:23 AM  Result Value Ref Range   Hemoglobin 7.4 (L) 12.0 - 16.0 g/dL   HCT 95.621.9 (L) 21.335.0 - 08.647.0 %  Prepare RBC     Status: None   Collection Time: 09/11/16 10:30 AM  Result Value Ref  Range   Order Confirmation ORDER PROCESSED BY BLOOD BANK     Dg Chest Portable 1 View  Result Date: 09/10/2016 CLINICAL DATA:  Pain following fall EXAM: PORTABLE CHEST 1 VIEW COMPARISON:  August 11, 2016 FINDINGS: There is mild scarring in the left base. There is no edema or consolidation. Heart is mildly enlarged with pulmonary vascularity within normal limits. No adenopathy. No pneumothorax. Bones are osteoporotic. IMPRESSION: Mild scarring left base. No edema or consolidation. Stable cardiac prominence. There is aortic atherosclerosis. Aortic Atherosclerosis (ICD10-I70.0). Electronically Signed   By: Bretta Bang III M.D.   On: 09/10/2016 08:25   Dg Hip Operative Unilat W Or W/o Pelvis Right  Result Date: 09/10/2016 CLINICAL DATA:  Elective surgery.  Right hip fracture. EXAM: OPERATIVE RIGHT HIP (WITH PELVIS IF PERFORMED) 2VIEWS TECHNIQUE: Fluoroscopic spot image(s) were submitted for interpretation post-operatively. COMPARISON:  Preoperative radiographs yesterday. FINDINGS: Four fluoroscopic spot images obtained in the operating room with intramedullary nail, and distal locking and proximal lag screw fixating intertrochanteric  right femur fracture. Fracture is in improved alignment compared to preoperative exam. Fluoroscopy time 2 minutes 2 seconds. IMPRESSION: Fluoroscopic spot views post ORIF intertrochanteric right femur fracture. Electronically Signed   By: Rubye Oaks M.D.   On: 09/10/2016 22:13   Dg Hip Unilat With Pelvis 2-3 Views Right  Result Date: 09/10/2016 CLINICAL DATA:  Pain following fall EXAM: DG HIP (WITH OR WITHOUT PELVIS) 2-3V RIGHT COMPARISON:  None. FINDINGS: Frontal pelvis as well as frontal and lateral right hip images were obtained. There is a comminuted intertrochanteric femur fracture on the right with varus angulation the fracture site. There is mild impaction at the fracture site. There is avulsion medially of the lesser trochanter. No other fracture. No dislocation. There is slight symmetric narrowing of both hip joints. Bones are osteoporotic. IMPRESSION: Comminuted fracture intertrochanteric region on the right with medial displacement of the lesser trochanter. Mild varus angulation at fracture site. No other fractures. No dislocations. Mild symmetric narrowing of both hip joints. Bones are osteoporotic. Electronically Signed   By: Bretta Bang III M.D.   On: 09/10/2016 08:24   Dg Femur, Min 2 Views Right  Result Date: 09/10/2016 CLINICAL DATA:  83 y/o  F; postop right hip fracture. EXAM: RIGHT FEMUR 2 VIEWS COMPARISON:  09/10/2016 right hip radiographs FINDINGS: Right femur intramedullary rod and proximal threaded screw traversing femoral neck. Avulsion fracture of the lesser trochanter with medial displacement. Air and edema within soft tissues compatible with postsurgical change and lateral thigh skin staples. No new acute fracture identified. IMPRESSION: Right proximal femoral comminuted intratrochanteric fracture fixation with expected postsurgical changes. No new acute fracture. Electronically Signed   By: Mitzi Hansen M.D.   On: 09/10/2016 21:22    Assessment/Plan: 1  Day Post-Op   Active Problems:   Hip fracture (HCC)  Patient is stable postop. I agree with the plan for blood transfusion given her postoperative hemoglobin.  Blood loss is likely from her fracture. Patient's Foley catheter has been removed. She has completed 24 hours postop antibiotics. Begin physical therapy after blood transfusion. Recheck labs in the a.m. Patient will begin Lovenox for DVT prophylaxis today.    [name] , MD 09/11/2016, 12:55 PM

## 2016-09-11 NOTE — Anesthesia Postprocedure Evaluation (Signed)
Anesthesia Post Note  Patient: [name]  Procedure(s) Performed: Procedure(s) (LRB): INTRAMEDULLARY (IM) NAIL INTERTROCHANTRIC (Right)  Patient location during evaluation: Nursing Unit Anesthesia Type: Spinal Level of consciousness: awake and awake and alert Pain management: pain level controlled Vital Signs Assessment: post-procedure vital signs reviewed and stable Respiratory status: spontaneous breathing Cardiovascular status: blood pressure returned to baseline Postop Assessment: no headache, no backache and no signs of nausea or vomiting Anesthetic complications: no     Last Vitals:  Vitals:   09/11/16 0400 09/11/16 0410  BP: (!) 85/39 (!) 105/50  Pulse: 70   Resp:    Temp:      Last Pain:  Vitals:   09/11/16 0352  TempSrc:   PainSc: 0-No pain                 Kimyata Milich Lawerance CruelStarr

## 2016-09-11 NOTE — Progress Notes (Signed)
OT Cancellation Note  Patient Details Name: Carrie Calhoun MRN: [id number] DOB: 1930/04/29   Cancelled Treatment:    Reason Eval/Treat Not Completed: Medical issues which prohibited therapy. Order received, chart reviewed. Pt recently received blood transfusion due to low HGB. New lab values pending. Will hold OT evaluation at this time and continue to monitor for appropriateness to evaluate at later date/time.  Richrd PrimeJamie Stiller, MPH, MS, OTR/L ascom [phone] 09/11/16, 1:57 PM

## 2016-09-11 NOTE — Progress Notes (Signed)
[facility] [place] [facility]   PATIENT NAME: Carrie Calhoun    MR#:  [id number]  DATE OF BIRTH:  [birth year]  SUBJECTIVE: Admitted yesterday for right hip fracture, status post repair. Confused this morning because of narcotics. Son  is at the bedside, concerned about confusion.   CHIEF COMPLAINT:   Chief Complaint  Patient presents with  . Hip Pain    REVIEW OF SYSTEMS:   ROS CONSTITUTIONAL: No fever, fatigue or weakness.  EYES: No blurred or double vision.  EARS, NOSE, AND THROAT: No tinnitus or ear pain.  RESPIRATORY: No cough, shortness of breath, wheezing or hemoptysis.  CARDIOVASCULAR: No chest pain, orthopnea, edema.  GASTROINTESTINAL: No nausea, vomiting, diarrhea or abdominal pain.  GENITOURINARY: No dysuria, hematuria.  ENDOCRINE: No polyuria, nocturia,  HEMATOLOGY: No anemia, easy bruising or bleeding SKIN: No rash or lesion. MUSCULOSKELETAL: Left hip pain. NEUROLOGIC: Confused  PSYCHIATRY: No anxiety or depression.   DRUG ALLERGIES:  No Known Allergies  VITALS:  Blood pressure (!) 112/56, pulse 75, temperature 98 F (36.7 C), temperature source Oral, resp. rate 16, height 5' (1.524 m), weight 53.5 kg (118 lb), SpO2 97 %.  PHYSICAL EXAMINATION:  GENERAL:  83 y.o.-year-old patient lying in the bed with Confused EYES: Pupils equal, round, reactive to light /  HEENT: Head atraumatic, normocephalic. Oropharynx and nasopharynx clear.  NECK:  Supple, no jugular venous distention. No thyroid enlargement, no tenderness.  LUNGS: Normal breath sounds bilaterally, no wheezing, rales,rhonchi or crepitation. No use of accessory muscles of respiration.  CARDIOVASCULAR: S1, S2 normal. No murmurs, rubs, or gallops.  ABDOMEN: Soft, nontender, nondistended. Bowel sounds present. No organomegaly or mass.  EXTREMITIES ;moaning  in pain due to left hip pain . NEUROLOGIC: Confused, unable to do full neurological exam. PSYCHIATRIC: The patient is  slightly onfused. SKIN: No obvious rash, lesion, or ulcer.    LABORATORY PANEL:   CBC  Recent Labs Lab 09/11/16 0451  WBC 8.7  HGB 7.4*  HCT 22.6*  PLT 171   ------------------------------------------------------------------------------------------------------------------  Chemistries   Recent Labs Lab 09/11/16 0451  NA 138  K 4.2  CL 106  CO2 28  GLUCOSE 112*  BUN 25*  CREATININE 1.24*  CALCIUM 8.2*   ------------------------------------------------------------------------------------------------------------------  Cardiac Enzymes No results for input(s): TROPONINI in the last 168 hours. ------------------------------------------------------------------------------------------------------------------  RADIOLOGY:  Dg Chest Portable 1 View  Result Date: 09/10/2016 CLINICAL DATA:  Pain following fall EXAM: PORTABLE CHEST 1 VIEW COMPARISON:  August 11, 2016 FINDINGS: There is mild scarring in the left base. There is no edema or consolidation. Heart is mildly enlarged with pulmonary vascularity within normal limits. No adenopathy. No pneumothorax. Bones are osteoporotic. IMPRESSION: Mild scarring left base. No edema or consolidation. Stable cardiac prominence. There is aortic atherosclerosis. Aortic Atherosclerosis (ICD10-I70.0). Electronically Signed   By: Bretta BangWilliam  Woodruff III M.D.   On: 09/10/2016 08:25   Dg Hip Operative Unilat W Or W/o Pelvis Right  Result Date: 09/10/2016 CLINICAL DATA:  Elective surgery.  Right hip fracture. EXAM: OPERATIVE RIGHT HIP (WITH PELVIS IF PERFORMED) 2VIEWS TECHNIQUE: Fluoroscopic spot image(s) were submitted for interpretation post-operatively. COMPARISON:  Preoperative radiographs yesterday. FINDINGS: Four fluoroscopic spot images obtained in the operating room with intramedullary nail, and distal locking and proximal lag screw fixating intertrochanteric right femur fracture. Fracture is in improved alignment compared to preoperative exam.  Fluoroscopy time 2 minutes 2 seconds. IMPRESSION: Fluoroscopic spot views post ORIF intertrochanteric right femur fracture. Electronically Signed   By: Rubye OaksMelanie  Ehinger  M.D.   On: 09/10/2016 22:13   Dg Hip Unilat With Pelvis 2-3 Views Right  Result Date: 09/10/2016 CLINICAL DATA:  Pain following fall EXAM: DG HIP (WITH OR WITHOUT PELVIS) 2-3V RIGHT COMPARISON:  None. FINDINGS: Frontal pelvis as well as frontal and lateral right hip images were obtained. There is a comminuted intertrochanteric femur fracture on the right with varus angulation the fracture site. There is mild impaction at the fracture site. There is avulsion medially of the lesser trochanter. No other fracture. No dislocation. There is slight symmetric narrowing of both hip joints. Bones are osteoporotic. IMPRESSION: Comminuted fracture intertrochanteric region on the right with medial displacement of the lesser trochanter. Mild varus angulation at fracture site. No other fractures. No dislocations. Mild symmetric narrowing of both hip joints. Bones are osteoporotic. Electronically Signed   By: Bretta Bang III M.D.   On: 09/10/2016 08:24   Dg Femur, Min 2 Views Right  Result Date: 09/10/2016 CLINICAL DATA:  83 y/o  F; postop right hip fracture. EXAM: RIGHT FEMUR 2 VIEWS COMPARISON:  09/10/2016 right hip radiographs FINDINGS: Right femur intramedullary rod and proximal threaded screw traversing femoral neck. Avulsion fracture of the lesser trochanter with medial displacement. Air and edema within soft tissues compatible with postsurgical change and lateral thigh skin staples. No new acute fracture identified. IMPRESSION: Right proximal femoral comminuted intratrochanteric fracture fixation with expected postsurgical changes. No new acute fracture. Electronically Signed   By: Mitzi Hansen M.D.   On: 09/10/2016 21:22    EKG:   Orders placed or performed during the hospital encounter of 08/11/16  . ED EKG  . ED EKG  . EKG  12-Lead  . EKG 12-Lead  . EKG    ASSESSMENT AND PLAN:   #1 acute left hip fracture status post ORIF. Having lots of pain but having confusion with narcotics. Avoid oxycodone and morphine, use Toradol for pain control and see if it helps him a continue IV hydration because her by mouth intake is poor.  2 possible urinary retention: Foley was removed yesterday, bladder scan showed more than 270 mL. Patient having hip pain, unable to use bedside commode. #3 acute blood loss anemia, hemoglobin 10.8 yesterday, today 7.4, transfuse 1 unit of packed RBC.  #3 metabolic encephalopathy like to check narcotics, check a UA to see if she has urine infection.  #4  DO NOT RESUSCITATE  Discussed with patient's son at bedside. All the records are reviewed and case discussed with Care Management/Social Workerr. Management plans discussed with the patient, family and they are in agreement.  CODE STATUS: DNR  TOTAL TIME TAKING CARE OF THIS PATIENT: 35 minutes.   POSSIBLE D/C IN 1-2DAYS, DEPENDING ON CLINICAL CONDITION.   Katha Hamming M.D on 09/11/2016 at 10:02 AM  Between 7am to 6pm - Pager - [phone]  After 6pm go to [email or web address] - password [email or web address] [facility]  [facility]  Office  [phone]  CC: Primary care physician; Lynnea Ferrier, MD   Note: This dictation was prepared with Dragon dictation along with smaller phrase technology. Any transcriptional errors that result from this process are unintentional.

## 2016-09-11 NOTE — Progress Notes (Signed)
PT Cancellation Note  Patient Details Name: Carrie Calhoun MRN: [id number] DOB: Sep 08, 1930   Cancelled Treatment:    Reason Eval/Treat Not Completed: Other (comment). PT consult received. Chart reviewed. Pt currently receiving blood transfusion due to low HGB. Will hold PT at this time and re-attempt at later date/time.   Gwenlyn Foundiley Javonte Elenes, SPT 09/11/16,11:23 AM [phone]

## 2016-09-11 NOTE — Care Management Note (Signed)
Case Management Note  Patient Details  Name: Carrie Calhoun MRN: [id number] Date of Birth: [birth year]  Subjective/Objective:  [facility] consult received for discharge planning. PT pending. Will follow and assess progression and needs.                  Action/Plan:   Expected Discharge Date:                  Expected Discharge Plan:     In-House Referral:     Discharge planning Services  CM Consult  Post Acute Care Choice:    Choice offered to:     DME Arranged:    DME Agency:     HH Arranged:    HH Agency:     Status of Service:  In process, will continue to follow  If discussed at Long Length of Stay Meetings, dates discussed:    Additional Comments:  Marily MemosLisa M Amarisa Wilinski, RN 09/11/2016, 1:49 PM

## 2016-09-12 ENCOUNTER — Encounter: Payer: Self-pay | Admitting: Internal Medicine

## 2016-09-12 DIAGNOSIS — Z66 Do not resuscitate: Secondary | ICD-10-CM | POA: Diagnosis present

## 2016-09-12 LAB — URINE CULTURE: Culture: NO GROWTH

## 2016-09-12 LAB — BASIC METABOLIC PANEL
Anion gap: 7 (ref 5–15)
BUN: 25 mg/dL — ABNORMAL HIGH (ref 6–20)
CALCIUM: 8.4 mg/dL — AB (ref 8.9–10.3)
CO2: 24 mmol/L (ref 22–32)
Chloride: 105 mmol/L (ref 101–111)
Creatinine, Ser: 1.25 mg/dL — ABNORMAL HIGH (ref 0.44–1.00)
GFR calc Af Amer: 45 mL/min — ABNORMAL LOW (ref >60)
GFR calc non Af Amer: 39 mL/min — ABNORMAL LOW (ref >60)
Glucose, Bld: 112 mg/dL — ABNORMAL HIGH (ref 65–99)
Potassium: 4.3 mmol/L (ref 3.5–5.1)
Sodium: 136 mmol/L (ref 135–145)

## 2016-09-12 LAB — TYPE AND SCREEN
ABO/RH(D): O POS
ANTIBODY SCREEN: NEGATIVE
Unit division: 0

## 2016-09-12 LAB — CBC
HEMATOCRIT: 25.4 % — AB (ref 35.0–47.0)
Hemoglobin: 8.7 g/dL — ABNORMAL LOW (ref 12.0–16.0)
MCH: 31 pg (ref 26.0–34.0)
MCHC: 34 g/dL (ref 32.0–36.0)
MCV: 91.1 fL (ref 80.0–100.0)
Platelets: 158 10*3/uL (ref 150–440)
RBC: 2.79 MIL/uL — ABNORMAL LOW (ref 3.80–5.20)
RDW: 13.9 % (ref 11.5–14.5)
WBC: 10.1 10*3/uL (ref 3.6–11.0)

## 2016-09-12 LAB — BPAM RBC
BLOOD PRODUCT EXPIRATION DATE: 201807292359
ISSUE DATE / TIME: 201807111110
UNIT TYPE AND RH: 5100

## 2016-09-12 MED ORDER — KETOROLAC TROMETHAMINE 30 MG/ML IJ SOLN
15.0000 mg | Freq: Three times a day (TID) | INTRAMUSCULAR | Status: DC | PRN
  Administered 2016-09-12 – 2016-09-13 (×2): 15 mg via INTRAVENOUS
  Filled 2016-09-12 (×2): qty 1

## 2016-09-12 NOTE — Progress Notes (Signed)
Subjective:  Postoperative day #2 status post medullary fixation for right intertrochanteric hip fracture with subtrochanteric extension. Patient reports pain as mild at rest.  Patient is lying in bed. Her family is at the bedside. Patient has not yet had a bowel movement.  Objective:   VITALS:   Vitals:   09/11/16 1141 09/11/16 1330 09/11/16 2223 09/12/16 0743  BP: (!) 136/58 (!) 144/54 136/62 (!) 142/53  Pulse: 74 72 60 65  Resp:   16   Temp: 98.8 F (37.1 C) 98 F (36.7 C) 98.4 F (36.9 C) 97.7 F (36.5 C)  TempSrc: Oral Oral Axillary Oral  SpO2: 97% 97% 98% 96%  Weight:      Height:        PHYSICAL EXAM: Right lower extremity: Neurovascular intact Sensation intact distally Intact pulses distally Dorsiflexion/Plantar flexion intact Incision: dressing C/D/I No cellulitis present Compartment soft  LABS  Results for orders placed or performed during the hospital encounter of 09/10/16 (from the past 24 hour(s))  CBC     Status: Abnormal   Collection Time: 09/12/16  3:46 AM  Result Value Ref Range   WBC 10.1 3.6 - 11.0 K/uL   RBC 2.79 (L) 3.80 - 5.20 MIL/uL   Hemoglobin 8.7 (L) 12.0 - 16.0 g/dL   HCT 16.125.4 (L) 09.635.0 - 04.547.0 %   MCV 91.1 80.0 - 100.0 fL   MCH 31.0 26.0 - 34.0 pg   MCHC 34.0 32.0 - 36.0 g/dL   RDW 40.913.9 81.111.5 - 91.414.5 %   Platelets 158 150 - 440 K/uL  Basic metabolic panel     Status: Abnormal   Collection Time: 09/12/16  3:46 AM  Result Value Ref Range   Sodium 136 135 - 145 mmol/L   Potassium 4.3 3.5 - 5.1 mmol/L   Chloride 105 101 - 111 mmol/L   CO2 24 22 - 32 mmol/L   Glucose, Bld 112 (H) 65 - 99 mg/dL   BUN 25 (H) 6 - 20 mg/dL   Creatinine, Ser 7.821.25 (H) 0.44 - 1.00 mg/dL   Calcium 8.4 (L) 8.9 - 10.3 mg/dL   GFR calc non Af Amer 39 (L) >60 mL/min   GFR calc Af Amer 45 (L) >60 mL/min   Anion gap 7 5 - 15    Dg Hip Operative Unilat W Or W/o Pelvis Right  Result Date: 09/10/2016 CLINICAL DATA:  Elective surgery.  Right hip fracture. EXAM:  OPERATIVE RIGHT HIP (WITH PELVIS IF PERFORMED) 2VIEWS TECHNIQUE: Fluoroscopic spot image(s) were submitted for interpretation post-operatively. COMPARISON:  Preoperative radiographs yesterday. FINDINGS: Four fluoroscopic spot images obtained in the operating room with intramedullary nail, and distal locking and proximal lag screw fixating intertrochanteric right femur fracture. Fracture is in improved alignment compared to preoperative exam. Fluoroscopy time 2 minutes 2 seconds. IMPRESSION: Fluoroscopic spot views post ORIF intertrochanteric right femur fracture. Electronically Signed   By: Rubye OaksMelanie  Ehinger M.D.   On: 09/10/2016 22:13   Dg Femur, Min 2 Views Right  Result Date: 09/10/2016 CLINICAL DATA:  83 y/o  F; postop right hip fracture. EXAM: RIGHT FEMUR 2 VIEWS COMPARISON:  09/10/2016 right hip radiographs FINDINGS: Right femur intramedullary rod and proximal threaded screw traversing femoral neck. Avulsion fracture of the lesser trochanter with medial displacement. Air and edema within soft tissues compatible with postsurgical change and lateral thigh skin staples. No new acute fracture identified. IMPRESSION: Right proximal femoral comminuted intratrochanteric fracture fixation with expected postsurgical changes. No new acute fracture. Electronically Signed   By: Micah NoelLance  Furusawa-Stratton M.D.   On: 09/10/2016 21:22    Assessment/Plan: 2 Days Post-Op   Active Problems:   Hip fracture (HCC)   DNR (do not resuscitate)  Patient doing well from an orthopedic standpoint. Continue physical therapy and current pain management.  Continue Lovenox for DVT prophylaxis.  Patient should remain on Lovenox for 4 weeks postop. Will consult pharmacy to confirm dose prior to discharge.  Patient's hemoglobin improved today after a transfusion yesterday. Recheck CBC in the morning.      [name] , MD 09/12/2016, 6:57 PM

## 2016-09-12 NOTE — Evaluation (Signed)
Occupational Therapy Evaluation Patient Details Name: Carrie Calhoun MRN: [id number] DOB: Jul 16, 1930 Today's Date: 09/12/2016    History of Present Illness Pt. is an 83 y.o. female who was admitted to [facility] for IMN repair of an Intertrochanteric Hip Fx. Pt. PMHx includes: AV Block, cardiac arrest, CKD, HTN, h/o overactive bladder, rib fractures..   Clinical Impression   Pt. Is an 83 y.o. Female who was admitted to [facility] for IMN repair of an intertrochanteric Hip Fx. Pt. Resides at [facility]. Pt. Reports being very active, and participates in the daily activities offered by the facility. Pt. Walks to a common dining are for meals with a walker, and reports being independent with ADLs. Pt. has a history of multiple falls. Pt. presents with a history of multiple falls, weakness, decreased activity tolerance, limited cognition, and impaired safety awareness which hinder her ability to complete ADL, and IADL functioning. Pt. education was provided about A/E use for LE ADLs. Pt. Could benefit from SNF level of care with follow-up OT services.    Follow Up Recommendations  SNF    Equipment Recommendations       Recommendations for Other Services       Precautions / Restrictions Precautions Precautions: Fall Restrictions Weight Bearing Restrictions: Yes RLE Weight Bearing: Weight bearing as tolerated             ADL either performed or assessed with clinical judgement   ADL Overall ADL's : Needs assistance/impaired Eating/Feeding: Set up   Grooming: Set up   Upper Body Bathing: Minimal assistance   Lower Body Bathing: Maximal assistance   Upper Body Dressing : Minimal assistance   Lower Body Dressing: Maximal assistance               Functional mobility during ADLs: Maximal assistance;+2 for physical assistance General ADL Comments: Pt. education was provided about A/E use for LE ADLs.     Vision Baseline Vision/History: Wears glasses Wears Glasses: At all times  (Pt. reports she does not have them with her.) Patient Visual Report: No change from baseline       Perception     Praxis      Pertinent Vitals/Pain Pain Assessment: 0-10 Pain Score: 4  Pain Location: Right Hip Pain Descriptors / Indicators: Operative site guarding;Grimacing;Guarding Pain Intervention(s): Limited activity within patient's tolerance;Monitored during session     Hand Dominance Right   Extremity/Trunk Assessment Upper Extremity Assessment Upper Extremity Assessment: Generalized weakness           Communication     Cognition Arousal/Alertness: Awake/alert Behavior During Therapy: Flat affect Overall Cognitive Status: Impaired/Different from baseline Area of Impairment: Orientation                               General Comments: pt not oriented to place (couldn't recall the city; could tell me she was in the hospital this session); pt oriented to person, time, and situation.   General Comments       Exercises Total Joint Exercises Ankle Circles/Pumps: AROM;Strengthening;Both;10 reps;Supine (HOB elevated) Quad Sets: AROM;Strengthening;Both;10 reps;Supine (HOB elevated) Short Arc Quad: AROM;Strengthening;Right;10 reps;Supine (HOB elevated) Heel Slides: AROM;Strengthening;Right;10 reps;Supine;Limitations (HOB elevated) Heel Slides Limitations: ROM limited due to pain Hip ABduction/ADduction: AAROM;Strengthening;Right;10 reps;Supine (HOB elevated; adduction to neutral) Other Exercises Other Exercises: Weight shifting: B LE, 10 reps, standing with RW and min assist (verbal cueing to off-set weight through B UE as needed)   Shoulder Instructions  Home Living Family/patient expects to be discharged to:: Skilled nursing facility                             Home Equipment: Dan HumphreysWalker - 4 wheels;Shower seat;Cane - single point   Additional Comments: [facility].      Prior Functioning/Environment Level of  Independence: Independent with assistive device(s)        Comments: Pt. resides and ALF. Pt. reports independent with ADLs. Pt. attends activities offered  to the residents. Pt. has had multiple falls. Pt. uses a walker to walk to the dining area.         OT Problem List: Decreased strength;Pain;Decreased cognition;Decreased activity tolerance;Impaired UE functional use;Decreased knowledge of use of DME or AE      OT Treatment/Interventions: Self-care/ADL training;Therapeutic exercise;Patient/family education;DME and/or AE instruction;Therapeutic activities    OT Goals(Current goals can be found in the care plan section) Acute Rehab OT Goals Patient Stated Goal: to be able to walk again  OT Frequency: Min 2X/week   Barriers to D/C:            Co-evaluation              AM-PAC PT "6 Clicks" Daily Activity     Outcome Measure Help from another person eating meals?: None Help from another person taking care of personal grooming?: A Little Help from another person toileting, which includes using toliet, bedpan, or urinal?: A Lot Help from another person bathing (including washing, rinsing, drying)?: A Lot Help from another person to put on and taking off regular upper body clothing?: A Lot Help from another person to put on and taking off regular lower body clothing?: A Lot 6 Click Score: 15   End of Session    Activity Tolerance: Patient tolerated treatment well Patient left: in chair;with call bell/phone within reach;with chair alarm set  OT Visit Diagnosis: Muscle weakness (generalized) (M62.81)                Time: 1510-1535 OT Time Calculation (min): 25 min Charges:  OT General Charges $OT Visit: 1 Procedure OT Evaluation $OT Eval Moderate Complexity: 1 Procedure G-Codes:     Olegario MessierElaine Anesha Hackert, MS, OTR/L   Olegario MessierElaine Shannon Balthazar, MS, OTR/L 09/12/2016, 3:59 PM

## 2016-09-12 NOTE — Progress Notes (Signed)
[facility] - [place] at [facility]   PATIENT NAME: Carrie Calhoun    MR#:  [id number]  DATE OF BIRTH:  [birth year]  CHIEF COMPLAINT:   Chief Complaint  Patient presents with  . Hip Pain   Continues to be confused. Pain right hip  REVIEW OF SYSTEMS:   ROS  Cannot obtain review of systems due to confusion.  DRUG ALLERGIES:  No Known Allergies  VITALS:  Blood pressure (!) 142/53, pulse 65, temperature 97.7 F (36.5 C), temperature source Oral, resp. rate 16, height 5' (1.524 m), weight 53.5 kg (118 lb), SpO2 96 %.  PHYSICAL EXAMINATION:  GENERAL:  83 y.o.-year-old patient lying in the bed with Confused. EYES: Pupils equal, round, reactive to light. HEENT: Head atraumatic, normocephalic. Oropharynx and nasopharynx clear.  NECK:  Supple, no jugular venous distention. No thyroid enlargement, no tenderness.  LUNGS: Normal breath sounds bilaterally, no wheezing, rales,rhonchi or crepitation. No use of accessory muscles of respiration. CARDIOVASCULAR: S1, S2 normal. No murmurs, rubs, or gallops.  ABDOMEN: Soft, nontender, nondistended. Bowel sounds present. No organomegaly or mass.  EXTREMITIES - Right hip scars look clean with dressing NEUROLOGIC: Confused, unable to do full neurological exam. PSYCHIATRIC: The patient is slightly onfused. SKIN: No obvious rash, lesion, or ulcer.   LABORATORY PANEL:   CBC  Recent Labs Lab 09/12/16 0346  WBC 10.1  HGB 8.7*  HCT 25.4*  PLT 158   ------------------------------------------------------------------------------------------------------------------  Chemistries   Recent Labs Lab 09/12/16 0346  NA 136  K 4.3  CL 105  CO2 24  GLUCOSE 112*  BUN 25*  CREATININE 1.25*  CALCIUM 8.4*   ------------------------------------------------------------------------------------------------------------------  Cardiac Enzymes No results for input(s): TROPONINI in the last 168  hours. ------------------------------------------------------------------------------------------------------------------  RADIOLOGY:  Dg Hip Operative Unilat W Or W/o Pelvis Right  Result Date: 09/10/2016 CLINICAL DATA:  Elective surgery.  Right hip fracture. EXAM: OPERATIVE RIGHT HIP (WITH PELVIS IF PERFORMED) 2VIEWS TECHNIQUE: Fluoroscopic spot image(s) were submitted for interpretation post-operatively. COMPARISON:  Preoperative radiographs yesterday. FINDINGS: Four fluoroscopic spot images obtained in the operating room with intramedullary nail, and distal locking and proximal lag screw fixating intertrochanteric right femur fracture. Fracture is in improved alignment compared to preoperative exam. Fluoroscopy time 2 minutes 2 seconds. IMPRESSION: Fluoroscopic spot views post ORIF intertrochanteric right femur fracture. Electronically Signed   By: Rubye Oaks M.D.   On: 09/10/2016 22:13   Dg Femur, Min 2 Views Right  Result Date: 09/10/2016 CLINICAL DATA:  83 y/o  F; postop right hip fracture. EXAM: RIGHT FEMUR 2 VIEWS COMPARISON:  09/10/2016 right hip radiographs FINDINGS: Right femur intramedullary rod and proximal threaded screw traversing femoral neck. Avulsion fracture of the lesser trochanter with medial displacement. Air and edema within soft tissues compatible with postsurgical change and lateral thigh skin staples. No new acute fracture identified. IMPRESSION: Right proximal femoral comminuted intratrochanteric fracture fixation with expected postsurgical changes. No new acute fracture. Electronically Signed   By: Mitzi Hansen M.D.   On: 09/10/2016 21:22    EKG:   Orders placed or performed during the hospital encounter of 08/11/16  . ED EKG  . ED EKG  . EKG 12-Lead  . EKG 12-Lead  . EKG    ASSESSMENT AND PLAN:   # Acute left hip fracture status post ORIF. Having lots of pain but having confusion with narcotics. Avoid oxycodone and morphine, use Toradol for  pain control and see if it helps him a continue IV hydration because her by mouth intake  is poor.  # Acute blood loss anemia, hemoglobin 10.8 --> 7.4 Transfused 1 unit of packed RBC on 09/11/2016. Hemoglobin improved.  # acute encephalopathy likely due to medications. Improving. No UTI on urinalysis.  # Hypertension. Continue home medications.  #  DO NOT RESUSCITATE  All the records are reviewed and case discussed with Care Management/Social Worker Management plans discussed with the patient, family and they are in agreement.  CODE STATUS: DNR  TOTAL TIME TAKING CARE OF THIS PATIENT: 35 minutes.   POSSIBLE D/C IN 1-2DAYS, DEPENDING ON CLINICAL CONDITION.   Milagros LollSudini, Lonzie Simmer R M.D on 09/12/2016 at 12:44 PM  Between 7am to 6pm - Pager - [phone]  After 6pm go to [email or web address] - password [email or web address] [email or web address]  [facility]  Office  [phone]  CC: Primary care physician; Lynnea FerrierKlein, Bert J III, MD   Note: This dictation was prepared with Dragon dictation along with smaller phrase technology. Any transcriptional errors that result from this process are unintentional.

## 2016-09-12 NOTE — Clinical Social Work Note (Addendum)
[facility]  [facility] can accept patient once she is medically ready for discharge and orders have been received.  CSW updated case manager and physician, per physician patient is not ready for discharge today, maybe tomorrow.  Ervin KnackEric R. Glennys Schorsch, MSW, Theresia MajorsLCSWA [phone]  09/12/2016 3:07 PM

## 2016-09-12 NOTE — Progress Notes (Signed)
Physical Therapy Treatment Patient Details Name: Judee ClaraBarbara W Haning MRN: [id number] DOB: May 15, 1930 Today's Date: 09/12/2016    History of Present Illness Pt is an 83 y.o. female presenting to hospital s/p fall with R hip pain.  Pt s/p IMN for intertrochanteric hip fx 09/10/16.  PMH includes AV block, cardiac arrest, CKD, htn, h/o overactive bladder, rib fx's.    PT Comments    Pt continuing to progress with initiating gait (able to ambulate a few feet; limited due to fatigue) during this session with +2 assist with verbal cueing for LE placement and B UE off-setting for weight distribution. Pt reports 5/10 R hip pain beginning and end of session with no reported increase during session (pt receiving pain meds at end of session). Pt requires verbal cueing for proper foot placement for sit-to/from-stands and stand-pivot transfers. Next session focus on increasing ambulation distance.   Follow Up Recommendations  SNF     Equipment Recommendations  Rolling walker with 5" wheels    Recommendations for Other Services       Precautions / Restrictions Precautions Precautions: Fall Restrictions Weight Bearing Restrictions: Yes RLE Weight Bearing: Weight bearing as tolerated    Mobility  Bed Mobility Overal bed mobility: Needs Assistance Bed Mobility: Sit to Supine      Sit to supine: Mod assist;Max assist;+2 for physical assistance;HOB elevated   General bed mobility comments: 2 assist for trunk and B LE for sit-to-supine  Transfers Overall transfer level: Needs assistance Equipment used: Rolling walker (2 wheeled) Transfers: Sit to/from [employer];Stand Pivot Transfers Sit to Stand: Min assist;Mod assist;+2 physical assistance Stand pivot transfers: Min assist;Mod assist;+2 physical assistance       General transfer comment: assist to initiate and come to full stand; vc's for hand and feet placement; pt able to stand-pivot and ambulate a few ft forward, turn and a few feet  backwards to EOB with increased time and verbal cueing required with +2 assist and RW to EOB from chair  Ambulation/Gait Ambulation/Gait assistance: Max assist;+2 physical assistance   Assistive device: Rolling walker (2 wheeled)   Gait velocity: decreased gait speed   General Gait Details: pt able to initiate step with verbal cueing for off-setting weight with B UE and proper foot placement with +2 assist; pt also able to pivot on L LE with verbal cueing for proper form and foot placement to transfer to bed after ambulation   Stairs            Wheelchair Mobility    Modified Rankin (Stroke Patients Only)       Balance Overall balance assessment: Needs assistance Sitting-balance support: Bilateral upper extremity supported;Feet supported Sitting balance-Leahy Scale: Fair Sitting balance - Comments: static sitting   Standing balance support: Bilateral upper extremity supported Standing balance-Leahy Scale: Fair Standing balance comment: static standing with RW; also performed weight shifting on B LE with min assist and B UE supported on RW with no overt loss of balance                            Cognition Arousal/Alertness: Awake/alert Behavior During Therapy: Flat affect Overall Cognitive Status: Impaired/Different from baseline Area of Impairment: Orientation                               General Comments: pt not oriented to place (couldn't recall the city; could tell me she was in the  hospital this session); pt oriented to person, time, and situation.      Exercises Total Joint Exercises Ankle Circles/Pumps: AROM;Strengthening;Both;10 reps;Supine (HOB elevated) Quad Sets: AROM;Strengthening;Both;10 reps;Supine (HOB elevated) Short Arc Quad: AROM;Strengthening;Right;10 reps;Supine (HOB elevated) Heel Slides: AROM;Strengthening;Right;10 reps;Supine;Limitations (HOB elevated) Heel Slides Limitations: ROM limited due to pain Hip  ABduction/ADduction: AAROM;Strengthening;Right;10 reps;Supine (HOB elevated; adduction to neutral) Other Exercises Other Exercises: Weight shifting: B LE, 10 reps, standing with RW and min assist (verbal cueing to off-set weight through B UE as needed)    General Comments        Pertinent Vitals/Pain Pain Assessment: 0-10 Pain Score: 5  Pain Location: R hip Pain Descriptors / Indicators: Operative site guarding;Grimacing;Guarding Pain Intervention(s): Limited activity within patient's tolerance;Monitored during session;Patient requesting pain meds-RN notified;Repositioned;Premedicated before session (pt received tylenol prior to session; pt receiving toradol at end of session)  HR - 64 to 72 bpm during session via pulse ox O2 - 95 to 98% on RA during session via pulse ox    Home Living                 Additional Comments: Pt lives at [facility].    Prior Function            PT Goals (current goals can now be found in the care plan section) Acute Rehab PT Goals Patient Stated Goal: to be able to walk again PT Goal Formulation: With patient Time For Goal Achievement: 09/25/16 Potential to Achieve Goals: Good Progress towards PT goals: Progressing toward goals    Frequency    BID      PT Plan Current plan remains appropriate    Co-evaluation              AM-PAC PT "6 Clicks" Daily Activity  Outcome Measure  Difficulty turning over in bed (including adjusting bedclothes, sheets and blankets)?: Total Difficulty moving from lying on back to sitting on the side of the bed? : Total Difficulty sitting down on and standing up from a chair with arms (e.g., wheelchair, bedside commode, etc,.)?: Total Help needed moving to and from a bed to chair (including a wheelchair)?: Total Help needed walking in hospital room?: Total Help needed climbing 3-5 steps with a railing? : Total 6 Click Score: 6    End of Session Equipment Utilized During Treatment: Gait  belt Activity Tolerance: Patient limited by fatigue;Patient limited by pain (pain limited heel slide exercise) Patient left: in bed;with call bell/phone within reach;with bed alarm set;with nursing/sitter in room;with family/visitor present;with SCD's reapplied (B heels elevated via towel rolls; SCD's in place and activated appropriately) Nurse Communication: Mobility status;Patient requests pain meds;Precautions;Weight bearing status (via whiteboard; pt receiving toradol at end of session) PT Visit Diagnosis: Other abnormalities of gait and mobility (R26.89);Muscle weakness (generalized) (M62.81);Repeated falls (R29.6);Difficulty in walking, not elsewhere classified (R26.2);Pain Pain - Right/Left: Right Pain - part of body: Hip     Time: 1610-9604 PT Time Calculation (min) (ACUTE ONLY): 39 min  Charges:                      G CodesGwenlyn Found, SPT 2016/09/14,3:29 PM [phone]

## 2016-09-12 NOTE — Progress Notes (Signed)
OT Cancellation Note  Patient Details Name: Carrie Calhoun MRN: [id number] DOB: 06-Dec-1930   Cancelled Treatment:    Reason Eval/Treat Not Completed: Patient's level of consciousness. Order received, chart reviewed. Pt sleeping soundly upon entry into room. Opened eyes temporarily with verbal cues, requested additional blankets for warmth. Blankets retrieved, covered pt.  Despite verbal and tactile cues would not open eyes. Pt requested OT come back later. Will re-attempt OT evaluation at later time as schedule allows.  Richrd PrimeJamie Stiller, MPH, MS, OTR/L ascom [phone] 09/12/16, 10:29 AM

## 2016-09-12 NOTE — Progress Notes (Signed)
Physical Therapy Treatment Patient Details Name: Carrie Calhoun MRN: [id number] DOB: [birth year] Today's Date: 09/12/2016    History of Present Illness Pt is an 83 y.o. female presenting to hospital s/p fall with R hip pain.  Pt s/p IMN for intertrochanteric hip fx 09/10/16.  PMH includes AV block, cardiac arrest, CKD, htn, h/o overactive bladder, rib fx's.    PT Comments    Pt able to progress sit-to-stand and stand-pivot transfers to [facility] with +2 mod assist during this session. Pt reports minimal (1/10) pain throughout this session. Pt requires verbal cueing for B UE and B LE placement during sit-to/from-stands and stand-pivot transfers for proper form. Next session focus on pre-ambulation activities and introducing therapeutic exercise packet.   Follow Up Recommendations  SNF     Equipment Recommendations  Rolling walker with 5" wheels    Recommendations for Other Services       Precautions / Restrictions Precautions Precautions: Fall Restrictions Weight Bearing Restrictions: Yes RLE Weight Bearing: Weight bearing as tolerated    Mobility  Bed Mobility Overal bed mobility: Needs Assistance Bed Mobility: Supine to Sit     Supine to sit: Mod assist;+2 for physical assistance;HOB elevated     General bed mobility comments: 2 assist for trunk and B LE for supine-to-sit  Transfers Overall transfer level: Needs assistance Equipment used: Rolling walker (2 wheeled) Transfers: Sit to/from [employer] Sit to Stand: Min assist;Mod assist;+2 physical assistance Stand pivot transfers: Min assist;Mod assist;+2 physical assistance       General transfer comment: assist to initiate and come to full stand; vc's for hand and feet placement; pt able to stand for 2 trials; 1st trial stand with modified pivot to [facility] with RW and +2 assist; 2nd trial pt stood with RW and +2 assist while chair was wheeled behind to sit for end of  session  Ambulation/Gait Ambulation/Gait assistance: Total assist           General Gait Details: pt unable to initiate step with either LE with max cueing and assist (limited due to R hip pain); pt able to pivot and initiate minimal step on L LE (enough to get to [facility])   Stairs            Wheelchair Mobility    Modified Rankin (Stroke Patients Only)       Balance Overall balance assessment: Needs assistance Sitting-balance support: Bilateral upper extremity supported;Feet supported Sitting balance-Leahy Scale: Fair Sitting balance - Comments: static sitting   Standing balance support: Bilateral upper extremity supported Standing balance-Leahy Scale: Fair Standing balance comment: static standing with RW                            Cognition Arousal/Alertness: Awake/alert Behavior During Therapy: Flat affect Overall Cognitive Status: Impaired/Different from baseline Area of Impairment: Orientation                               General Comments: pt not oriented to place (couldn't recall the city or that she was in the hospital); pt oriented to person, time (date via whiteboard), and situation. Pt more talkative and alert, but still takes extra time to recall information.      Exercises      General Comments        Pertinent Vitals/Pain Pain Assessment: 0-10 Pain Score: 1  Pain Location: R hip Pain Descriptors / Indicators:  Operative site guarding;Grimacing;Guarding Pain Intervention(s): Limited activity within patient's tolerance;Monitored during session;Premedicated before session;Repositioned  HR - 66 to 78 bpm throughout session via pulse ox O2 - 95 to 97% on RA throughout session via pulse ox    Home Living                      Prior Function            PT Goals (current goals can now be found in the care plan section) Acute Rehab PT Goals Patient Stated Goal: to be able to walk again PT Goal Formulation: With  patient Time For Goal Achievement: 09/25/16 Potential to Achieve Goals: Good Progress towards PT goals: Progressing toward goals    Frequency    BID      PT Plan Current plan remains appropriate    Co-evaluation              AM-PAC PT "6 Clicks" Daily Activity  Outcome Measure  Difficulty turning over in bed (including adjusting bedclothes, sheets and blankets)?: Total Difficulty moving from lying on back to sitting on the side of the bed? : Total Difficulty sitting down on and standing up from a chair with arms (e.g., wheelchair, bedside commode, etc,.)?: Total Help needed moving to and from a bed to chair (including a wheelchair)?: Total Help needed walking in hospital room?: Total Help needed climbing 3-5 steps with a railing? : Total 6 Click Score: 7    End of Session Equipment Utilized During Treatment: Gait belt Activity Tolerance: Patient limited by fatigue Patient left: in chair;with call bell/phone within reach;with chair alarm set;with SCD's reapplied (B heels elevated via towel rolls; SCD's in place and activated appropriately) Nurse Communication: Mobility status;Precautions;Weight bearing status (via whiteboard) PT Visit Diagnosis: Other abnormalities of gait and mobility (R26.89);Muscle weakness (generalized) (M62.81);Repeated falls (R29.6);Difficulty in walking, not elsewhere classified (R26.2);Pain Pain - Right/Left: Right Pain - part of body: Hip     Time: [id number] PT Time Calculation (min) (ACUTE ONLY): 32 min  Charges:                       G CodesGwenlyn Found:       Darek Eifler, SPT 09/12/16,1:33 PM [phone]

## 2016-09-13 DIAGNOSIS — M25561 Pain in right knee: Secondary | ICD-10-CM | POA: Diagnosis not present

## 2016-09-13 DIAGNOSIS — Z9181 History of falling: Secondary | ICD-10-CM | POA: Diagnosis not present

## 2016-09-13 DIAGNOSIS — S72009A Fracture of unspecified part of neck of unspecified femur, initial encounter for closed fracture: Secondary | ICD-10-CM | POA: Diagnosis not present

## 2016-09-13 DIAGNOSIS — F321 Major depressive disorder, single episode, moderate: Secondary | ICD-10-CM | POA: Diagnosis not present

## 2016-09-13 DIAGNOSIS — Y999 Unspecified external cause status: Secondary | ICD-10-CM | POA: Diagnosis not present

## 2016-09-13 DIAGNOSIS — Y939 Activity, unspecified: Secondary | ICD-10-CM | POA: Diagnosis not present

## 2016-09-13 DIAGNOSIS — Z7982 Long term (current) use of aspirin: Secondary | ICD-10-CM | POA: Diagnosis not present

## 2016-09-13 DIAGNOSIS — S7011XA Contusion of right thigh, initial encounter: Secondary | ICD-10-CM | POA: Diagnosis not present

## 2016-09-13 DIAGNOSIS — Z79899 Other long term (current) drug therapy: Secondary | ICD-10-CM | POA: Diagnosis not present

## 2016-09-13 DIAGNOSIS — S72001D Fracture of unspecified part of neck of right femur, subsequent encounter for closed fracture with routine healing: Secondary | ICD-10-CM | POA: Diagnosis not present

## 2016-09-13 DIAGNOSIS — M542 Cervicalgia: Secondary | ICD-10-CM | POA: Diagnosis not present

## 2016-09-13 DIAGNOSIS — M47816 Spondylosis without myelopathy or radiculopathy, lumbar region: Secondary | ICD-10-CM | POA: Diagnosis not present

## 2016-09-13 DIAGNOSIS — D62 Acute posthemorrhagic anemia: Secondary | ICD-10-CM | POA: Diagnosis not present

## 2016-09-13 DIAGNOSIS — M8000XA Age-related osteoporosis with current pathological fracture, unspecified site, initial encounter for fracture: Secondary | ICD-10-CM | POA: Diagnosis not present

## 2016-09-13 DIAGNOSIS — R079 Chest pain, unspecified: Secondary | ICD-10-CM | POA: Diagnosis not present

## 2016-09-13 DIAGNOSIS — M80051D Age-related osteoporosis with current pathological fracture, right femur, subsequent encounter for fracture with routine healing: Secondary | ICD-10-CM | POA: Diagnosis not present

## 2016-09-13 DIAGNOSIS — I10 Essential (primary) hypertension: Secondary | ICD-10-CM | POA: Diagnosis not present

## 2016-09-13 DIAGNOSIS — S0990XA Unspecified injury of head, initial encounter: Secondary | ICD-10-CM | POA: Diagnosis not present

## 2016-09-13 DIAGNOSIS — R4182 Altered mental status, unspecified: Secondary | ICD-10-CM | POA: Diagnosis not present

## 2016-09-13 DIAGNOSIS — S7001XA Contusion of right hip, initial encounter: Secondary | ICD-10-CM | POA: Diagnosis not present

## 2016-09-13 DIAGNOSIS — S72024D Nondisplaced fracture of epiphysis (separation) (upper) of right femur, subsequent encounter for closed fracture with routine healing: Secondary | ICD-10-CM | POA: Diagnosis not present

## 2016-09-13 DIAGNOSIS — M25551 Pain in right hip: Secondary | ICD-10-CM | POA: Diagnosis not present

## 2016-09-13 DIAGNOSIS — N183 Chronic kidney disease, stage 3 (moderate): Secondary | ICD-10-CM | POA: Diagnosis not present

## 2016-09-13 DIAGNOSIS — Z8781 Personal history of (healed) traumatic fracture: Secondary | ICD-10-CM | POA: Diagnosis not present

## 2016-09-13 DIAGNOSIS — M79651 Pain in right thigh: Secondary | ICD-10-CM | POA: Diagnosis not present

## 2016-09-13 DIAGNOSIS — I4430 Unspecified atrioventricular block: Secondary | ICD-10-CM | POA: Diagnosis not present

## 2016-09-13 DIAGNOSIS — R2681 Unsteadiness on feet: Secondary | ICD-10-CM | POA: Diagnosis not present

## 2016-09-13 DIAGNOSIS — G8911 Acute pain due to trauma: Secondary | ICD-10-CM | POA: Diagnosis not present

## 2016-09-13 DIAGNOSIS — Z7401 Bed confinement status: Secondary | ICD-10-CM | POA: Diagnosis not present

## 2016-09-13 DIAGNOSIS — M6281 Muscle weakness (generalized): Secondary | ICD-10-CM | POA: Diagnosis not present

## 2016-09-13 DIAGNOSIS — G9340 Encephalopathy, unspecified: Secondary | ICD-10-CM | POA: Diagnosis not present

## 2016-09-13 DIAGNOSIS — E039 Hypothyroidism, unspecified: Secondary | ICD-10-CM | POA: Diagnosis not present

## 2016-09-13 DIAGNOSIS — R109 Unspecified abdominal pain: Secondary | ICD-10-CM | POA: Diagnosis not present

## 2016-09-13 DIAGNOSIS — R410 Disorientation, unspecified: Secondary | ICD-10-CM | POA: Diagnosis not present

## 2016-09-13 DIAGNOSIS — F0390 Unspecified dementia without behavioral disturbance: Secondary | ICD-10-CM | POA: Diagnosis not present

## 2016-09-13 DIAGNOSIS — W19XXXA Unspecified fall, initial encounter: Secondary | ICD-10-CM | POA: Diagnosis not present

## 2016-09-13 DIAGNOSIS — Y929 Unspecified place or not applicable: Secondary | ICD-10-CM | POA: Diagnosis not present

## 2016-09-13 DIAGNOSIS — F0280 Dementia in other diseases classified elsewhere without behavioral disturbance: Secondary | ICD-10-CM | POA: Diagnosis not present

## 2016-09-13 DIAGNOSIS — I129 Hypertensive chronic kidney disease with stage 1 through stage 4 chronic kidney disease, or unspecified chronic kidney disease: Secondary | ICD-10-CM | POA: Diagnosis not present

## 2016-09-13 DIAGNOSIS — M545 Low back pain: Secondary | ICD-10-CM | POA: Diagnosis not present

## 2016-09-13 LAB — CBC
HCT: 24.1 % — ABNORMAL LOW (ref 35.0–47.0)
Hemoglobin: 8.2 g/dL — ABNORMAL LOW (ref 12.0–16.0)
MCH: 31.6 pg (ref 26.0–34.0)
MCHC: 33.9 g/dL (ref 32.0–36.0)
MCV: 93.1 fL (ref 80.0–100.0)
PLATELETS: 173 10*3/uL (ref 150–440)
RBC: 2.58 MIL/uL — AB (ref 3.80–5.20)
RDW: 13.6 % (ref 11.5–14.5)
WBC: 11.1 10*3/uL — AB (ref 3.6–11.0)

## 2016-09-13 MED ORDER — METHOCARBAMOL 500 MG PO TABS: 500.0000 mg | ORAL_TABLET | Freq: Four times a day (QID) | ORAL | 0 refills | Status: AC | PRN

## 2016-09-13 MED ORDER — FERROUS SULFATE 325 (65 FE) MG PO TABS: 325.0000 mg | ORAL_TABLET | Freq: Every day | ORAL | 3 refills | Status: DC

## 2016-09-13 MED ORDER — ENOXAPARIN SODIUM 30 MG/0.3ML ~~LOC~~ SOLN: 30.0000 mg | SUBCUTANEOUS | 0 refills | Status: DC

## 2016-09-13 MED ORDER — DOCUSATE SODIUM 100 MG PO CAPS: 100.0000 mg | ORAL_CAPSULE | Freq: Two times a day (BID) | ORAL | 0 refills | Status: AC

## 2016-09-13 MED ORDER — POLYETHYLENE GLYCOL 3350 17 G PO PACK: 17.0000 g | PACK | Freq: Once | ORAL | Status: DC

## 2016-09-13 NOTE — Progress Notes (Signed)
Physical Therapy Treatment Patient Details Name: Carrie Calhoun MRN: [id number] DOB: 1931/01/22 Today's Date: 09/13/2016    History of Present Illness Pt is an 83 y.o. female presenting to hospital s/p fall with R hip pain.  Pt s/p IMN for intertrochanteric hip fx 09/10/16.  PMH includes AV block, cardiac arrest, CKD, htn, h/o overactive bladder, rib fx's.    PT Comments    Pt progressing with bed mobility, transfers, and ambulation (4 ft) requiring +1 mod assist and verbal cueing for proper UE and LE placement. Pt reports 2/10 R hip pain beginning and end of session with increased R hip pain noted during ambulation. Next session progress ambulation distance per pt tolerance.   Follow Up Recommendations  SNF     Equipment Recommendations  Rolling walker with 5" wheels    Recommendations for Other Services       Precautions / Restrictions Precautions Precautions: Fall Restrictions Weight Bearing Restrictions: Yes RLE Weight Bearing: Weight bearing as tolerated    Mobility  Bed Mobility Overal bed mobility: Needs Assistance Bed Mobility: Supine to Sit     Supine to sit: Mod assist     General bed mobility comments: pt able to go from supine-to-sit with mod assist for trunk and R LE   Transfers Overall transfer level: Needs assistance Equipment used: Rolling walker (2 wheeled) Transfers: Sit to/from [employer] Sit to Stand: Mod assist Stand pivot transfers: Mod assist       General transfer comment: +1 mod assist to initiate standing; vc's for hand placement and feet placement; pt able to stand-pivot with side steps to [facility] with +1 mod assist and RW  Ambulation/Gait Ambulation/Gait assistance: Mod assist Ambulation Distance (Feet): 4 Feet Assistive device: Rolling walker (2 wheeled) Gait Pattern/deviations: Step-to pattern;Decreased step length - left;Decreased step length - right;Antalgic Gait velocity: decreased gait speed   General Gait  Details: significant increase in time to perform gait exercise; pt stopped responding to verbal cues after ~4 ft and was sat in her bedside chair; vitals were assessed and within normal range; after ~1 min pt more alert and able to respond to questions asked   Stairs            Wheelchair Mobility    Modified Rankin (Stroke Patients Only)       Balance Overall balance assessment: Needs assistance Sitting-balance support: Bilateral upper extremity supported;Feet supported Sitting balance-Leahy Scale: Fair Sitting balance - Comments: static sitting   Standing balance support: Bilateral upper extremity supported Standing balance-Leahy Scale: Fair Standing balance comment: static standing with RW; pt also able to take one hand off the walker to help adjust gown for use of BSC with no overt loss of balance with CGA for safety                            Cognition Arousal/Alertness: Awake/alert Behavior During Therapy: Flat affect Overall Cognitive Status: Within Functional Limits for tasks assessed                                 General Comments: pt oriented to person, place, time and situation at beginninng of session; pt not oriented to year end of session      Exercises      General Comments        Pertinent Vitals/Pain Pain Assessment: 0-10 Pain Score: 2  (increased R hip pain  noted during ambulation) Pain Location: Right Hip Pain Descriptors / Indicators: Dull;Sore;Operative site guarding;Grimacing;Guarding Pain Intervention(s): Limited activity within patient's tolerance;Monitored during session;Premedicated before session;Repositioned  HR - 74 to 88 bpm throughout session via pulse ox O2 - 95 to 98% on RA throughout session via pulse ox    Home Living                      Prior Function            PT Goals (current goals can now be found in the care plan section) Acute Rehab PT Goals Patient Stated Goal: to be able to  walk again PT Goal Formulation: With patient Time For Goal Achievement: 09/25/16 Potential to Achieve Goals: Good Progress towards PT goals: Progressing toward goals    Frequency    BID      PT Plan Current plan remains appropriate    Co-evaluation              AM-PAC PT "6 Clicks" Daily Activity  Outcome Measure  Difficulty turning over in bed (including adjusting bedclothes, sheets and blankets)?: Total Difficulty moving from lying on back to sitting on the side of the bed? : Total Difficulty sitting down on and standing up from a chair with arms (e.g., wheelchair, bedside commode, etc,.)?: Total Help needed moving to and from a bed to chair (including a wheelchair)?: A Lot Help needed walking in hospital room?: A Lot Help needed climbing 3-5 steps with a railing? : Total 6 Click Score: 8    End of Session Equipment Utilized During Treatment: Gait belt Activity Tolerance: Patient limited by fatigue;Patient limited by pain Patient left: in chair;with call bell/phone within reach;with chair alarm set;with SCD's reapplied (B heels elevated; SCD's in place and activated) Nurse Communication: Mobility status;Precautions;Weight bearing status;Other (comment) (via whiteboard; nursing also notified of lack of response from pt, but that it had resolved and vitals were within normal limits) PT Visit Diagnosis: Other abnormalities of gait and mobility (R26.89);Muscle weakness (generalized) (M62.81);Repeated falls (R29.6);Difficulty in walking, not elsewhere classified (R26.2);Pain Pain - Right/Left: Right Pain - part of body: Hip     Time: 1122-1202 PT Time Calculation (min) (ACUTE ONLY): 40 min  Charges:                       G CodesGwenlyn Found:       Waris Rodger, SPT 09/13/16,12:50 PM [phone]

## 2016-09-13 NOTE — Clinical Social Work Note (Addendum)
CSW attempted to contact patient's daughter Okey RegalCarol [phone], to inform her that patient will be discharging today to [facility], awaiting for call back.  11:40am CSW spoke to patient's son Ronnie DerbyJohn Jagger [phone] to inform him that patient will be discharging today.  Patient's son is requesting EMS transport per MD recommendation, CSW notified bedside nurse.  Patient to be d/c'ed today to [facility].  Patient and family agreeable to plans will transport via ems RN to call report to [phone] room 206.   Ervin KnackEric R. Leanda Padmore, MSW, Theresia MajorsLCSWA [phone]  09/13/2016 11:30 AM

## 2016-09-13 NOTE — Discharge Summary (Signed)
[facility] - [place] at [facility]   PATIENT NAME: Carrie MountainBarbara Calhoun    MR#:  [id number]  DATE OF BIRTH:  1930/07/17  DATE OF ADMISSION:  09/10/2016 ADMITTING PHYSICIAN: Carrie HammingSnehalatha Konidena, MD  DATE OF DISCHARGE: 09/13/2016  PRIMARY CARE PHYSICIAN: Carrie SitesKlein, Bert J III, MD   ADMISSION DIAGNOSIS:  Closed fracture of right hip, initial encounter (HCC) [S72.001A]  DISCHARGE DIAGNOSIS:  Active Problems:   Hip fracture (HCC)   DNR (do not resuscitate)   SECONDARY DIAGNOSIS:   Past Medical History:  Diagnosis Date  . Anxiety   . Atrioventricular block   . Cardiac arrest (HCC)   . CKD (chronic kidney disease) stage 3, GFR 30-59 ml/min   . Hypertension   . Renal disorder      ADMITTING HISTORY  HISTORY OF PRESENT ILLNESS:  Carrie Calhoun  is a 83 y.o. female with a known history of Anxiety, CK D stage Calhoun comes from [facility] because of the fall due to balance problem today, noted to have severe right hip pain and found to have a right hip fracture.   HOSPITAL COURSE:   # Acute left hip fracture status post ORIF. POD-3 Pain well controlled with tylenol and robaxin. Would try not to use narcotics if possible Lovenox for DVT prophylaxis. Total 4 weeks post-op F/u with Dr. Martha Calhoun  # Acute blood loss anemia, hemoglobin 10.8 --> 7.4 Transfused 1 unit of packed RBC on 09/11/2016. Hemoglobin improved to 8.2 Will start Iron supplements at discharge  # Acute encephalopathy likely due to medications. Resolved.  No UTI on urinalysis.  # Hypertension. Continue home medications.  #  DO NOT RESUSCITATE  Stable for discharge to rehab  CONSULTS OBTAINED:  Treatment Team:  Carrie Calhoun, Kevin, MD  DRUG ALLERGIES:  No Known Allergies  DISCHARGE MEDICATIONS:   Current Discharge Medication List    START taking these medications   Details  docusate sodium (COLACE) 100 MG capsule Take 1 capsule (100 mg total) by mouth 2 (two)  times daily. Qty: 10 capsule, Refills: 0    enoxaparin (LOVENOX) 30 MG/0.3ML injection Inject 0.3 mLs (30 mg total) into the skin daily. Qty: 25 Syringe, Refills: 0    methocarbamol (ROBAXIN) 500 MG tablet Take 1 tablet (500 mg total) by mouth every 6 (six) hours as needed for muscle spasms. Qty: 15 tablet, Refills: 0      CONTINUE these medications which have NOT CHANGED   Details  aspirin EC 81 MG tablet Take 1 tablet by mouth daily.    busPIRone (BUSPAR) 15 MG tablet Take 1 tablet by mouth daily.    Calcium Carbonate-Vitamin D 600-400 MG-UNIT tablet Take 1 tablet by mouth daily.    citalopram (CELEXA) 10 MG tablet Take 1 tablet (10 mg total) by mouth every other day.    donepezil (ARICEPT) 5 MG tablet Take 1 tablet by mouth daily.    levothyroxine (SYNTHROID, LEVOTHROID) 75 MCG tablet Take 1 tablet by mouth daily before breakfast.     Lifitegrast 5 % SOLN Place 1 drop into both eyes 2 (two) times daily.     mirabegron ER (MYRBETRIQ) 25 MG TB24 tablet Take 1 tablet (25 mg total) by mouth daily. Qty: 30 tablet, Refills: 0    Multiple Vitamins-Minerals (HAIR/SKIN/NAILS/BIOTIN PO) Take 1 tablet by mouth daily.    Omega-3 Fatty Acids (FISH OIL PO) Take 1 tablet by mouth daily.    oxybutynin (DITROPAN) 5 MG tablet     acetaminophen (TYLENOL) 325 MG  tablet Take 2 tablets (650 mg total) by mouth every 6 (six) hours as needed for mild pain (or Fever >/= 101).        Today   VITAL SIGNS:  Blood pressure 121/63, pulse 73, temperature 98.9 F (37.2 C), temperature source Oral, resp. rate 18, height 5' (1.524 m), weight 53.5 kg (118 lb), SpO2 99 %.  I/O:   Intake/Output Summary (Last 24 hours) at 09/13/16 1021 Last data filed at 09/12/16 1838  Gross per 24 hour  Intake              240 ml  Output                0 ml  Net              240 ml    PHYSICAL EXAMINATION:  Physical Exam  GENERAL:  83 y.o.-year-old patient lying in the bed with no acute distress.  LUNGS:  Normal breath sounds bilaterally, no wheezing, rales,rhonchi or crepitation. No use of accessory muscles of respiration.  CARDIOVASCULAR: S1, S2 normal. No murmurs, rubs, or gallops.  ABDOMEN: Soft, non-tender, non-distended. Bowel sounds present. No organomegaly or mass.  NEUROLOGIC: Moves all 4 extremities. PSYCHIATRIC: The patient is alert and awake SKIN: Surgical scars with dressing. No bleeding  DATA REVIEW:   CBC  Recent Labs Lab 09/13/16 0320  WBC 11.1*  HGB 8.2*  HCT 24.1*  PLT 173    Chemistries   Recent Labs Lab 09/12/16 0346  NA 136  K 4.3  CL 105  CO2 24  GLUCOSE 112*  BUN 25*  CREATININE 1.25*  CALCIUM 8.4*    Cardiac Enzymes No results for input(s): TROPONINI in the last 168 hours.  Microbiology Results  Results for orders placed or performed during the hospital encounter of 09/10/16  MRSA PCR Screening     Status: None   Collection Time: 09/10/16 11:15 AM  Result Value Ref Range Status   MRSA by PCR NEGATIVE NEGATIVE Final    Comment:        The GeneXpert MRSA Assay (FDA approved for NASAL specimens only), is one component of a comprehensive MRSA colonization surveillance program. It is not intended to diagnose MRSA infection nor to guide or monitor treatment for MRSA infections.   Urine Culture     Status: None   Collection Time: 09/11/16 10:07 AM  Result Value Ref Range Status   Specimen Description URINE, CATHETERIZED  Final   Special Requests NONE  Final   Culture   Final    NO GROWTH Performed at [facility], [address], [place], [place] [address]    Report Status 09/12/2016 FINAL  Final    RADIOLOGY:  No results found.  Follow up with PCP in 1 week.  Management plans discussed with the patient, family and they are in agreement.  CODE STATUS:     Code Status Orders        Start     Ordered   09/12/16 1248  Do not attempt resuscitation (DNR)  Continuous    Question Answer Comment  In the event of cardiac  or respiratory ARREST Do not call a "code blue"   In the event of cardiac or respiratory ARREST Do not perform Intubation, CPR, defibrillation or ACLS   In the event of cardiac or respiratory ARREST Use medication by any route, position, wound care, and other measures to relive pain and suffering. May use oxygen, suction and manual treatment of  airway obstruction as needed for comfort.      09/12/16 1247    Code Status History    Date Active Date Inactive Code Status Order ID Comments User Context   09/10/2016  3:28 PM 09/10/2016  9:40 PM Full Code [id number]  Carrie Fairly, MD Inpatient   09/10/2016  8:29 AM 09/10/2016  3:28 PM Full Code [id number]  Carrie Hamming, MD ED   07/04/2016  9:58 PM 07/09/2016  4:53 PM DNR [id number]  Shaune Pollack, MD Inpatient    Advance Directive Documentation     Most Recent Value  Type of Advance Directive  Healthcare Power of Attorney, Living will  Pre-existing out of facility DNR order (yellow form or pink MOST form)  -  "MOST" Form in Place?  -      TOTAL TIME TAKING CARE OF THIS PATIENT ON DAY OF DISCHARGE: more than 30 minutes.   Milagros Loll R M.D on 09/13/2016 at 10:21 AM  Between 7am to 6pm - Pager - [phone]  After 6pm go to [email or web address] - password [email or web address] [facility]  [facility]  Office  [phone]  CC: Primary care physician; Lynnea Ferrier, MD  Note: This dictation was prepared with Dragon dictation along with smaller phrase technology. Any transcriptional errors that result from this process are unintentional.

## 2016-09-13 NOTE — Care Management Important Message (Signed)
Important Message  Patient Details  Name: Carrie Calhoun MRN: [id number] Date of Birth: Feb 07, 1931   Medicare Important Message Given:  Yes    Marily MemosLisa M Marqus Macphee, RN 09/13/2016, 10:39 AM

## 2016-09-13 NOTE — Discharge Instructions (Signed)
Full weight bearing with assistance

## 2016-09-13 NOTE — Progress Notes (Addendum)
EMS here to transport patient. VSS at time of discharge. This nurse called her son, Jonny RuizJohn, to advise of the transport. Told him her room number had changed to 206. He said he would go to [facility] later this afternoon.

## 2016-09-13 NOTE — Progress Notes (Signed)
  Subjective:  POD #3 s/p IM rod right hip.  Patient reports pain as mild at rest.  Patient has had a bowel movement. She is going to [facility] today. Family members at the bedside.  Objective:   VITALS:   Vitals:   09/11/16 2223 09/12/16 0743 09/12/16 2300 09/13/16 0735  BP: 136/62 (!) 142/53 138/60 121/63  Pulse: 60 65 62 73  Resp: 16  16 18   Temp: 98.4 F (36.9 C) 97.7 F (36.5 C) 97.8 F (36.6 C) 98.9 F (37.2 C)  TempSrc: Axillary Oral Oral Oral  SpO2: 98% 96% 96% 99%  Weight:      Height:        PHYSICAL EXAM: Right lower extremity: Neurovascular intact Sensation intact distally Intact pulses distally Dorsiflexion/Plantar flexion intact Incision: dressing C/D/I No cellulitis present Compartment soft  LABS  Results for orders placed or performed during the hospital encounter of 09/10/16 (from the past 24 hour(s))  CBC     Status: Abnormal   Collection Time: 09/13/16  3:20 AM  Result Value Ref Range   WBC 11.1 (H) 3.6 - 11.0 K/uL   RBC 2.58 (L) 3.80 - 5.20 MIL/uL   Hemoglobin 8.2 (L) 12.0 - 16.0 g/dL   HCT 16.124.1 (L) 09.635.0 - 04.547.0 %   MCV 93.1 80.0 - 100.0 fL   MCH 31.6 26.0 - 34.0 pg   MCHC 33.9 32.0 - 36.0 g/dL   RDW 40.913.6 81.111.5 - 91.414.5 %   Platelets 173 150 - 440 K/uL    No results found.  Assessment/Plan: 3 Days Post-Op   Active Problems:   Hip fracture (HCC)   DNR (do not resuscitate)  Patient is progressing well from orthopedic standpoint. Her hemoglobin is stable today. Patient is made appropriate progress with physical therapy. I agree with the plan for discharge to [facility]. Follow up within 10-14 days my office for staple removal and reevaluation. Patient should continue on Lovenox for DVT prophylaxis until follow-up. Patient will remain weightbearing as tolerated on the right lower extremity using a walker for assistance with ambulation. Continue TED stockings until follow-up.    Juanell FairlyKRASINSKI, Matei Magnone , MD 09/13/2016, 12:48 PM

## 2016-09-13 NOTE — Progress Notes (Signed)
Called [place] to [place] [facility]. Patient is going to room 207A.  Patient has been prepared to be transported via EMS, belongings packed and IV removed. DC instruction packet given. EMS called.

## 2016-09-16 DIAGNOSIS — F0390 Unspecified dementia without behavioral disturbance: Secondary | ICD-10-CM | POA: Diagnosis not present

## 2016-09-16 DIAGNOSIS — I10 Essential (primary) hypertension: Secondary | ICD-10-CM | POA: Diagnosis not present

## 2016-09-16 DIAGNOSIS — M8000XA Age-related osteoporosis with current pathological fracture, unspecified site, initial encounter for fracture: Secondary | ICD-10-CM | POA: Diagnosis not present

## 2016-09-16 DIAGNOSIS — F321 Major depressive disorder, single episode, moderate: Secondary | ICD-10-CM | POA: Diagnosis not present

## 2016-09-23 ENCOUNTER — Ambulatory Visit: Payer: Self-pay | Admitting: Urology

## 2016-09-25 ENCOUNTER — Encounter: Payer: Self-pay | Admitting: Gerontology

## 2016-09-25 ENCOUNTER — Non-Acute Institutional Stay (SKILLED_NURSING_FACILITY): Payer: Medicare HMO | Admitting: Gerontology

## 2016-09-25 ENCOUNTER — Other Ambulatory Visit
Admission: RE | Admit: 2016-09-25 | Discharge: 2016-09-25 | Disposition: A | Discharge status: Home / Self Care | Payer: Medicare HMO | Source: Skilled Nursing Facility | Attending: Gerontology | Admitting: Gerontology

## 2016-09-25 DIAGNOSIS — R4182 Altered mental status, unspecified: Secondary | ICD-10-CM | POA: Insufficient documentation

## 2016-09-25 DIAGNOSIS — G9340 Encephalopathy, unspecified: Secondary | ICD-10-CM

## 2016-09-25 DIAGNOSIS — S72001D Fracture of unspecified part of neck of right femur, subsequent encounter for closed fracture with routine healing: Secondary | ICD-10-CM | POA: Diagnosis not present

## 2016-09-25 LAB — URINALYSIS, COMPLETE (UACMP) WITH MICROSCOPIC
BACTERIA UA: NONE SEEN
BILIRUBIN URINE: NEGATIVE
Glucose, UA: NEGATIVE mg/dL
HGB URINE DIPSTICK: NEGATIVE
Ketones, ur: NEGATIVE mg/dL
Leukocytes, UA: NEGATIVE
Nitrite: NEGATIVE
Protein, ur: NEGATIVE mg/dL
RBC / HPF: NONE SEEN RBC/hpf (ref 0–5)
Specific Gravity, Urine: 1.015 (ref 1.005–1.030)
Squamous Epithelial / LPF: NONE SEEN
pH: 5 (ref 5.0–8.0)

## 2016-09-25 LAB — COMPREHENSIVE METABOLIC PANEL
ALBUMIN: 3.6 g/dL (ref 3.5–5.0)
ALK PHOS: 93 U/L (ref 38–126)
ALT: 26 U/L (ref 14–54)
AST: 40 U/L (ref 15–41)
Anion gap: 9 (ref 5–15)
BILIRUBIN TOTAL: 0.7 mg/dL (ref 0.3–1.2)
BUN: 31 mg/dL — AB (ref 6–20)
CALCIUM: 9.3 mg/dL (ref 8.9–10.3)
CO2: 26 mmol/L (ref 22–32)
Chloride: 100 mmol/L — ABNORMAL LOW (ref 101–111)
Creatinine, Ser: 1.28 mg/dL — ABNORMAL HIGH (ref 0.44–1.00)
GFR calc Af Amer: 44 mL/min — ABNORMAL LOW (ref >60)
GFR calc non Af Amer: 38 mL/min — ABNORMAL LOW (ref >60)
GLUCOSE: 86 mg/dL (ref 65–99)
Potassium: 4.3 mmol/L (ref 3.5–5.1)
SODIUM: 135 mmol/L (ref 135–145)
TOTAL PROTEIN: 6.5 g/dL (ref 6.5–8.1)

## 2016-09-25 LAB — CBC WITH DIFFERENTIAL/PLATELET
BASOS ABS: 0.1 10*3/uL (ref 0–0.1)
BASOS PCT: 1 %
Eosinophils Absolute: 0.5 10*3/uL (ref 0–0.7)
Eosinophils Relative: 5 %
HEMATOCRIT: 29.5 % — AB (ref 35.0–47.0)
HEMOGLOBIN: 9.8 g/dL — AB (ref 12.0–16.0)
Lymphocytes Relative: 22 %
Lymphs Abs: 2.4 10*3/uL (ref 1.0–3.6)
MCH: 31.6 pg (ref 26.0–34.0)
MCHC: 33.4 g/dL (ref 32.0–36.0)
MCV: 94.7 fL (ref 80.0–100.0)
Monocytes Absolute: 0.9 10*3/uL (ref 0.2–0.9)
Monocytes Relative: 8 %
NEUTROS ABS: 6.9 10*3/uL — AB (ref 1.4–6.5)
NEUTROS PCT: 64 %
Platelets: 548 10*3/uL — ABNORMAL HIGH (ref 150–440)
RBC: 3.11 MIL/uL — AB (ref 3.80–5.20)
RDW: 14.9 % — ABNORMAL HIGH (ref 11.5–14.5)
WBC: 10.8 10*3/uL (ref 3.6–11.0)

## 2016-09-25 NOTE — Progress Notes (Signed)
Location:   [facility] Room Number: [place] of Service:  SNF [phone]) Provider:  Toni Arthurs, NP-C  Adin Hector, MD  Patient Care Team: Adin Hector, MD as PCP - General (Internal Medicine)  Extended Emergency Contact Information Primary Emergency Contact: Combs,Carol [place] [address] [place] Phone: [phone] Relation: Daughter Secondary Emergency Contact: [place] Phone: [phone] Relation: Son  Code Status:  FULL Goals of care: Advanced Directive information Advanced Directives 09/25/2016  Does Patient Have a Medical Advance Directive? No  Type of Advance Directive -  Does patient want to make changes to medical advance directive? -  Copy of [place] in Chart? -  Would patient like information on creating a medical advance directive? -     Chief Complaint  Patient presents with  . Acute Visit    Follow up on Confusion    HPI:  Pt is a 83 y.o. female seen today for an acute visit for increased confusion. Staff concerned as she was recently a patient here for rehab for another issue, was discharged to ALF and now has returned for rehab following admission to [facility] for fall with resulting Right Hip fracture. Pt's cognition is much different that on previous admission. She is confused, crying at times, forgetful, slow to respond to questions. On prior admission, she was oriented x 4, very clear, witty. On exam today, she expresses severe pain in the right leg, thigh with radiation to the groin and down the leg. She c/o severe pain in the knee. She continues to walk and do therapy despite the severe pain because she "knows she had to bear it [the pain] for therapy to make me stronger." She is constantly rubbing the thigh. She reports her pain is no better and no worse than when she was in the hospital. At one point during the assessment, pt grabbed her stomach and  flinched, but did not elaborate on the issue. Reports she is having regular BMs "as long as they bring me my apple juice." Voiding ok. Pt reports she has a very poor appetite and is forcing herself to eat. She is unable to give any other specific complaints, other than she is just "wore out." Unable to do SLR of Right leg without severe pain. Not taking deep breaths. Otherwise, VSS. No other complaints.    Past Medical History:  Diagnosis Date  . Anemia, unspecified   . Anxiety   . Atrioventricular block 06/09/2008   1st degree with bradycardia  . Cardiac arrest ([place])   . Chicken pox   . CKD (chronic kidney disease) stage 3, GFR 30-59 ml/min   . Diverticulosis   . History of cardiac arrest    during prior cataract surgery, thought to be from anesthesia, per pt. Followed by Dr. Saralyn Pilar  . Hyperlipidemia, unspecified   . Hypertension   . Hypothyroidism, unspecified   . Osteopenia   . Renal disorder    Past Surgical History:  Procedure Laterality Date  . APPENDECTOMY  1970  . CATARACT EXTRACTION, BILATERAL    . COLONOSCOPY  04/29/2006  . FOOT SURGERY Bilateral   . INTRAMEDULLARY (IM) NAIL INTERTROCHANTERIC Right 09/10/2016   Procedure: INTRAMEDULLARY (IM) NAIL INTERTROCHANTRIC;  Surgeon: Thornton Park, MD;  Location: [facility] ORS;  Service: Orthopedics;  Laterality: Right;  . TUBAL LIGATION Bilateral 1970    Allergies  Allergen Reactions  . Alendronate     unkown  .  Lidocaine     Cardiac Arrest  . Phenylephrine   . Phenyltoloxamine-Acetaminophen   . Azithromycin Rash    Allergies as of 09/25/2016      Reactions   Alendronate    unkown   Lidocaine    Cardiac Arrest   Phenylephrine    Phenyltoloxamine-acetaminophen    Azithromycin Rash      Medication List       Accurate as of 09/25/16  1:18 PM. Always use your most recent med list.          acetaminophen 325 MG tablet Commonly known as:  TYLENOL Take 2 tablets (650 mg total) by mouth every 6 (six) hours as  needed for mild pain (or Fever >/= 101).   aspirin EC 81 MG tablet Take 1 tablet by mouth daily.   busPIRone 15 MG tablet Commonly known as:  BUSPAR Take 1 tablet by mouth daily.   Calcium Carbonate-Vitamin D 600-400 MG-UNIT tablet Take 1 tablet by mouth daily.   Cholecalciferol 4000 units Caps Take 1 capsule by mouth daily.   citalopram 10 MG tablet Commonly known as:  CELEXA Take 1 tablet (10 mg total) by mouth every other day.   docusate sodium 100 MG capsule Commonly known as:  COLACE Take 1 capsule (100 mg total) by mouth 2 (two) times daily.   donepezil 5 MG tablet Commonly known as:  ARICEPT Take 1 tablet by mouth daily.   enoxaparin 30 MG/0.3ML injection Commonly known as:  LOVENOX Inject 0.3 mLs (30 mg total) into the skin daily.   FISH OIL PO Take 1 tablet by mouth daily.   levothyroxine 75 MCG tablet Commonly known as:  SYNTHROID, LEVOTHROID Take 1 tablet by mouth daily before breakfast.   Lifitegrast 5 % Soln Place 1 drop into both eyes 2 (two) times daily.   methocarbamol 500 MG tablet Commonly known as:  ROBAXIN Take 1 tablet (500 mg total) by mouth every 6 (six) hours as needed for muscle spasms.   mirabegron ER 25 MG Tb24 tablet Commonly known as:  MYRBETRIQ Take 1 tablet (25 mg total) by mouth daily.   Multiple Vitamin tablet Take 1 tablet by mouth daily.   triamcinolone cream 0.1 % Commonly known as:  KENALOG Apply 1 application topically 2 (two) times daily. Apply thin film to rash on back       Review of Systems  Constitutional: Positive for activity change and appetite change. Negative for chills, diaphoresis and fever.  HENT: Negative for congestion, sneezing, sore throat, trouble swallowing and voice change.   Respiratory: Negative for apnea, cough, choking, chest tightness, shortness of breath and wheezing.   Cardiovascular: Negative for chest pain, palpitations and leg swelling.  Gastrointestinal: Negative for abdominal  distention, abdominal pain, constipation, diarrhea and nausea.  Genitourinary: Negative for difficulty urinating, dysuria, frequency and urgency.  Musculoskeletal: Positive for arthralgias (typical arthritis), back pain and myalgias. Negative for gait problem.  Skin: Negative for color change, pallor, rash and wound.  Neurological: Positive for weakness. Negative for dizziness, tremors, syncope, speech difficulty, numbness and headaches.  Psychiatric/Behavioral: Positive for confusion and dysphoric mood. Negative for agitation and behavioral problems. The patient is nervous/anxious.   All other systems reviewed and are negative.   Immunization History  Administered Date(s) Administered  . Influenza-Unspecified 12/18/2011, 12/18/2012, 11/24/2015  . PPD Test 07/09/2016  . Pneumococcal Conjugate-13 07/21/2015  . Pneumococcal Polysaccharide-23 01/11/2003, 06/03/2008, 07/13/2013  . Td 05/29/2008  . Varicella 07/21/2015   Pertinent  Health Maintenance Due    Topic Date Due  . DEXA SCAN  11/23/1997  . PNA vac Low Risk Adult (1 of 2 - PCV13) 11/23/1997  . INFLUENZA VACCINE  10/02/2016   No flowsheet data found. Functional Status Survey:    Vitals:   09/25/16 1238  BP: 122/70  Pulse: 79  Resp: 17  Temp: 98.1 F (36.7 C)  SpO2: 97%  Weight: 107 lb 6.4 oz (48.7 kg)  Height: 5' 1" (1.549 m)   Body mass index is 20.29 kg/m. Physical Exam  Constitutional: She is oriented to person, place, and time. Vital signs are normal. She appears well-developed and well-nourished. She appears lethargic. She is active and cooperative. She appears ill. No distress.  HENT:  Head: Normocephalic and atraumatic.  Mouth/Throat: Uvula is midline, oropharynx is clear and moist and mucous membranes are normal. Mucous membranes are not pale, not dry and not cyanotic.  Eyes: Pupils are equal, round, and reactive to light. Conjunctivae, EOM and lids are normal.  Neck: Trachea normal, normal range of motion and  full passive range of motion without pain. Neck supple. No JVD present. No tracheal deviation, no edema and no erythema present. No thyromegaly present.  Cardiovascular: Normal rate, regular rhythm, normal heart sounds and intact distal pulses.  Exam reveals no gallop, no distant heart sounds and no friction rub.   No murmur heard. Pulses:      Dorsalis pedis pulses are 1+ on the right side, and 1+ on the left side.  Minimal edema in BLE/ ankles  Pulmonary/Chest: Effort normal and breath sounds normal. No accessory muscle usage. No respiratory distress. She has no decreased breath sounds. She has no wheezes. She has no rhonchi. She has no rales. She exhibits no tenderness.  Abdominal: Soft. Normal appearance. She exhibits no distension and no ascites. Bowel sounds are decreased. There is no tenderness.  Musculoskeletal: She exhibits no edema.       Right hip: She exhibits decreased range of motion, decreased strength, tenderness, swelling and laceration.       Right knee: She exhibits decreased range of motion and swelling. Tenderness found.  Expected osteoarthritis, stiffness; calves soft, supple. Negative Homan's sign  Neurological: She is oriented to person, place, and time. She has normal strength. She appears lethargic.  Skin: Skin is warm and dry. Laceration (right hip- s/p ORIF) noted. No rash noted. She is not diaphoretic. No cyanosis or erythema. No pallor. Nails show no clubbing.  Psychiatric: Her speech is normal and behavior is normal. Judgment and thought content normal. Her mood appears anxious. Cognition and memory are impaired. She exhibits a depressed mood. She exhibits abnormal recent memory.  Nursing note and vitals reviewed.   Labs reviewed:  Recent Labs  09/10/16 0715 09/11/16 0451 09/12/16 0346  NA 139 138 136  K 4.2 4.2 4.3  CL 103 106 105  CO2 28 28 24  GLUCOSE 121* 112* 112*  BUN 32* 25* 25*  CREATININE 1.27* 1.24* 1.25*  CALCIUM 9.3 8.2* 8.4*    Recent  Labs  06/14/16 1148 07/04/16 1705  AST 30 30  ALT 23 21  ALKPHOS 64 61  BILITOT 0.5 0.6  PROT 6.6 6.5  ALBUMIN 4.2 3.9    Recent Labs  07/04/16 1705  09/10/16 0715 09/11/16 0451  09/11/16 1521 09/12/16 0346 09/13/16 0320  WBC 7.1  < > 11.2* 8.7  --   --  10.1 11.1*  NEUTROABS 5.2  --  9.2*  --   --   --   --   --     HGB 11.1*  < > 10.8* 7.4*  < > 9.1* 8.7* 8.2*  HCT 33.4*  < > 32.6* 22.6*  < > 26.6* 25.4* 24.1*  MCV 95.1  < > 94.0 94.9  --   --  91.1 93.1  PLT 226  < > 254 171  --   --  158 173  < > = values in this interval not displayed. Lab Results  Component Value Date   TSH 3.958 07/04/2016   No results found for: HGBA1C No results found for: CHOL, HDL, LDLCALC, LDLDIRECT, TRIG, CHOLHDL  Significant Diagnostic Results in last 30 days:  Dg Chest Portable 1 View  Result Date: 09/10/2016 CLINICAL DATA:  Pain following fall EXAM: PORTABLE CHEST 1 VIEW COMPARISON:  August 11, 2016 FINDINGS: There is mild scarring in the left base. There is no edema or consolidation. Heart is mildly enlarged with pulmonary vascularity within normal limits. No adenopathy. No pneumothorax. Bones are osteoporotic. IMPRESSION: Mild scarring left base. No edema or consolidation. Stable cardiac prominence. There is aortic atherosclerosis. Aortic Atherosclerosis (ICD10-I70.0). Electronically Signed   By: William  Woodruff III M.D.   On: 09/10/2016 08:25   Dg Hip Operative Unilat W Or W/o Pelvis Right  Result Date: 09/10/2016 CLINICAL DATA:  Elective surgery.  Right hip fracture. EXAM: OPERATIVE RIGHT HIP (WITH PELVIS IF PERFORMED) 2VIEWS TECHNIQUE: Fluoroscopic spot image(s) were submitted for interpretation post-operatively. COMPARISON:  Preoperative radiographs yesterday. FINDINGS: Four fluoroscopic spot images obtained in the operating room with intramedullary nail, and distal locking and proximal lag screw fixating intertrochanteric right femur fracture. Fracture is in improved alignment compared to  preoperative exam. Fluoroscopy time 2 minutes 2 seconds. IMPRESSION: Fluoroscopic spot views post ORIF intertrochanteric right femur fracture. Electronically Signed   By: Melanie  Ehinger M.D.   On: 09/10/2016 22:13   Dg Hip Unilat With Pelvis 2-3 Views Right  Result Date: 09/10/2016 CLINICAL DATA:  Pain following fall EXAM: DG HIP (WITH OR WITHOUT PELVIS) 2-3V RIGHT COMPARISON:  None. FINDINGS: Frontal pelvis as well as frontal and lateral right hip images were obtained. There is a comminuted intertrochanteric femur fracture on the right with varus angulation the fracture site. There is mild impaction at the fracture site. There is avulsion medially of the lesser trochanter. No other fracture. No dislocation. There is slight symmetric narrowing of both hip joints. Bones are osteoporotic. IMPRESSION: Comminuted fracture intertrochanteric region on the right with medial displacement of the lesser trochanter. Mild varus angulation at fracture site. No other fractures. No dislocations. Mild symmetric narrowing of both hip joints. Bones are osteoporotic. Electronically Signed   By: William  Woodruff III M.D.   On: 09/10/2016 08:24   Dg Femur, Min 2 Views Right  Result Date: 09/10/2016 CLINICAL DATA:  83 y/o  F; postop right hip fracture. EXAM: RIGHT FEMUR 2 VIEWS COMPARISON:  09/10/2016 right hip radiographs FINDINGS: Right femur intramedullary rod and proximal threaded screw traversing femoral neck. Avulsion fracture of the lesser trochanter with medial displacement. Air and edema within soft tissues compatible with postsurgical change and lateral thigh skin staples. No new acute fracture identified. IMPRESSION: Right proximal femoral comminuted intratrochanteric fracture fixation with expected postsurgical changes. No new acute fracture. Electronically Signed   By: Lance  Furusawa-Stratton M.D.   On: 09/10/2016 21:22    Assessment/Plan 1. Closed fracture of right hip with routine healing, subsequent  encounter  Continue PT/OT  Lovenox 30 mg SQ Q Day  Ice to the hip prn for pain, edema  Schedule Tylenol 650 mg   po QID for pain  Tramadol 50 mg po Q 6 hours prn severe pain  2. Encephalopathy  Improve pain control  Labs  xrays to r/o occult fractures, constipation, etc.  Encourage po fluid intake  Family/ staff Communication:   Total Time:  Documentation:  Face to Face:  Family/Phone:   Labs/tests ordered:  Cbc, met c UA, C&S, complete view xrays right hip, knee, kub, chest  Medication list reviewed and assessed for continued appropriateness.   H. , NP-C Geriatrics [facility] [address] [place], [place] [address] Cell Phone (Mon-Fri 8am-5pm):  [phone] On Call:  [phone] & follow prompts after 5pm & weekends Office Phone:  [phone] Office Fax:  [phone]   

## 2016-09-27 LAB — URINE CULTURE: Culture: <10000 — AB

## 2016-10-02 ENCOUNTER — Encounter: Payer: Self-pay | Admitting: Gerontology

## 2016-10-02 ENCOUNTER — Encounter
Admission: RE | Admit: 2016-10-02 | Discharge: 2016-10-02 | Disposition: A | Discharge status: Home / Self Care | Payer: Medicare HMO | Source: Ambulatory Visit | Attending: Internal Medicine | Admitting: Internal Medicine

## 2016-10-02 ENCOUNTER — Non-Acute Institutional Stay (SKILLED_NURSING_FACILITY): Payer: Medicare HMO | Admitting: Gerontology

## 2016-10-02 DIAGNOSIS — W19XXXA Unspecified fall, initial encounter: Secondary | ICD-10-CM | POA: Diagnosis not present

## 2016-10-02 DIAGNOSIS — G8911 Acute pain due to trauma: Secondary | ICD-10-CM

## 2016-10-02 NOTE — Progress Notes (Signed)
Location:   [facility] Room Number: 206A Place of Service:  SNF [phone]) Provider:  Lorenso Quarry, NP-C  Lynnea Ferrier, MD  Patient Care Team: Lynnea Ferrier, MD as PCP - General (Internal Medicine)  Extended Emergency Contact Information Primary Emergency Contact: Combs,Carol [place] [address] [place] Home Phone: [phone] Relation: Daughter Secondary Emergency Contact: Delma [place] Home Phone: [phone] Relation: Son  Code Status:  FULL Goals of care: Advanced Directive information Advanced Directives 10/02/2016  Does Patient Have a Medical Advance Directive? No  Type of Advance Directive -  Does patient want to make changes to medical advance directive? -  Copy of Healthcare Power of Attorney in Chart? -  Would patient like information on creating a medical advance directive? -     Chief Complaint  Patient presents with  . Acute Visit    Follow up on hip pain    HPI:  Pt is a 83 y.o. female seen today for an acute visit for pain related to fall. Pt is at the facility for rehab following hospitalization for an Right proximal femur fracture with IM nailing. Pt got up during the night unassisted because she had a dream and was looking for her husband. Pt reports she fell on the right hip. She was not having any pain initially, but began having worsening pain in the hip and neck several hours later. No point tenderness in the C-spine or hip. No swelling/edema, no crepitus, no redness, no hematoma or bruising to the c-spine or hip. Pt does have increased pain with movement of the hip. No shortening or the leg or rotation. Pedal pulses equal and strong. Incision continues to be well approximated, no dehiscence. No bleeding or drainage. VSS. No other complaints.    Past Medical History:  Diagnosis Date  . Anemia, unspecified   . Anxiety   . Atrioventricular block 06/09/2008   1st degree with bradycardia    . Cardiac arrest (HCC)   . Chicken pox   . CKD (chronic kidney disease) stage 3, GFR 30-59 ml/min   . Diverticulosis   . History of cardiac arrest    during prior cataract surgery, thought to be from anesthesia, per pt. Followed by Dr. Darrold Junker  . Hyperlipidemia, unspecified   . Hypertension   . Hypothyroidism, unspecified   . Osteopenia   . Renal disorder    Past Surgical History:  Procedure Laterality Date  . APPENDECTOMY  1970  . CATARACT EXTRACTION, BILATERAL    . COLONOSCOPY  04/29/2006  . FOOT SURGERY Bilateral   . INTRAMEDULLARY (IM) NAIL INTERTROCHANTERIC Right 09/10/2016   Procedure: INTRAMEDULLARY (IM) NAIL INTERTROCHANTRIC;  Surgeon: [name], MD;  Location: [facility] ORS;  Service: Orthopedics;  Laterality: Right;  . TUBAL LIGATION Bilateral 1970    Allergies  Allergen Reactions  . Alendronate     unkown  . Lidocaine     Cardiac Arrest  . Phenylephrine   . Phenyltoloxamine-Acetaminophen   . Azithromycin Rash    Allergies as of 10/02/2016      Reactions   Alendronate    unkown   Lidocaine    Cardiac Arrest   Phenylephrine    Phenyltoloxamine-acetaminophen    Azithromycin Rash      Medication List       Accurate as of 10/02/16  4:11 PM. Always use your most recent med list.          acetaminophen 325 MG tablet Commonly  known as:  TYLENOL Take 650 mg by mouth 4 (four) times daily.   acetaminophen 325 MG tablet Commonly known as:  TYLENOL Take 2 tablets (650 mg total) by mouth every 6 (six) hours as needed for mild pain (or Fever >/= 101).   ASPERCREME LIDOCAINE 4 % Ptch Generic drug:  Lidocaine Apply 1 patch topically daily. Apply to area of pain usually either the inner groin or the right knee. If patient wants, may cut patch in half and use a piece on each site. Remove after 12 hours.   aspirin EC 81 MG tablet Take 1 tablet by mouth daily.   busPIRone 15 MG tablet Commonly known as:  BUSPAR Take 1 tablet by mouth daily.   Calcium  Carbonate-Vitamin D 600-400 MG-UNIT tablet Take 1 tablet by mouth daily.   Cholecalciferol 4000 units Caps Take 1 capsule by mouth daily.   citalopram 10 MG tablet Commonly known as:  CELEXA Take 1 tablet (10 mg total) by mouth every other day.   docusate sodium 100 MG capsule Commonly known as:  COLACE Take 1 capsule (100 mg total) by mouth 2 (two) times daily.   donepezil 5 MG tablet Commonly known as:  ARICEPT Take 1 tablet by mouth daily.   enoxaparin 30 MG/0.3ML injection Commonly known as:  LOVENOX Inject 0.3 mLs (30 mg total) into the skin daily.   FISH OIL PO Take 1 tablet by mouth daily.   levothyroxine 75 MCG tablet Commonly known as:  SYNTHROID, LEVOTHROID Take 1 tablet by mouth daily before breakfast.   Lifitegrast 5 % Soln Place 1 drop into both eyes 2 (two) times daily.   methocarbamol 500 MG tablet Commonly known as:  ROBAXIN Take 1 tablet (500 mg total) by mouth every 6 (six) hours as needed for muscle spasms.   mirabegron ER 25 MG Tb24 tablet Commonly known as:  MYRBETRIQ Take 1 tablet (25 mg total) by mouth daily.   Multiple Vitamin tablet Take 1 tablet by mouth daily.   traMADol 50 MG tablet Commonly known as:  ULTRAM Take 50 mg by mouth every 4 (four) hours as needed.       Review of Systems  Constitutional: Negative for activity change, appetite change, chills, diaphoresis and fever.  HENT: Negative for congestion, sneezing, sore throat, trouble swallowing and voice change.   Respiratory: Negative for apnea, cough, choking, chest tightness, shortness of breath and wheezing.   Cardiovascular: Negative for chest pain, palpitations and leg swelling.  Gastrointestinal: Negative for abdominal distention, abdominal pain, constipation, diarrhea and nausea.  Genitourinary: Negative for difficulty urinating, dysuria, frequency and urgency.  Musculoskeletal: Positive for arthralgias (typical arthritis), back pain and myalgias. Negative for gait  problem.  Skin: Negative for color change, pallor, rash and wound.  Neurological: Positive for weakness. Negative for dizziness, tremors, syncope, speech difficulty, numbness and headaches.  Psychiatric/Behavioral: Positive for confusion and dysphoric mood. Negative for agitation and behavioral problems. The patient is not nervous/anxious.   All other systems reviewed and are negative.   Immunization History  Administered Date(s) Administered  . Influenza-Unspecified 12/18/2011, 12/18/2012, 11/24/2015  . PPD Test 07/09/2016  . Pneumococcal Conjugate-13 07/21/2015  . Pneumococcal Polysaccharide-23 01/11/2003, 06/03/2008, 07/13/2013  . Td 05/29/2008  . Varicella 07/21/2015   Pertinent  Health Maintenance Due  Topic Date Due  . DEXA SCAN  11/23/1997  . INFLUENZA VACCINE  10/02/2016  . PNA vac Low Risk Adult  Completed   No flowsheet data found. Functional Status Survey:    Vitals:  10/02/16 1609  BP: 138/83  Pulse: 92  Resp: 16  Temp: (!) 97.5 F (36.4 C)  SpO2: 100%  Weight: 105 lb 12.8 oz (48 kg)  Height: 5\' 1"  (1.549 m)   Body mass index is 19.99 kg/m. Physical Exam  Constitutional: She is oriented to person, place, and time. Vital signs are normal. She appears well-developed and well-nourished. She appears lethargic. She is active and cooperative. She appears ill. No distress.  HENT:  Head: Normocephalic and atraumatic.  Mouth/Throat: Uvula is midline, oropharynx is clear and moist and mucous membranes are normal. Mucous membranes are not pale, not dry and not cyanotic.  Eyes: Pupils are equal, round, and reactive to light. Conjunctivae, EOM and lids are normal.  Neck: Trachea normal, normal range of motion and full passive range of motion without pain. Neck supple. No JVD present. No tracheal deviation, no edema and no erythema present. No thyromegaly present.  Cardiovascular: Normal rate, regular rhythm, normal heart sounds and intact distal pulses.  Exam reveals no  gallop, no distant heart sounds and no friction rub.   No murmur heard. Pulses:      Dorsalis pedis pulses are 1+ on the right side, and 1+ on the left side.  Minimal edema in BLE/ ankles  Pulmonary/Chest: Effort normal and breath sounds normal. No accessory muscle usage. No respiratory distress. She has no decreased breath sounds. She has no wheezes. She has no rhonchi. She has no rales. She exhibits no tenderness.  Abdominal: Soft. Normal appearance. She exhibits no distension and no ascites. Bowel sounds are decreased. There is no tenderness.  Musculoskeletal: She exhibits no edema.       Right hip: She exhibits decreased range of motion, decreased strength, tenderness, swelling and laceration.       Right knee: She exhibits decreased range of motion and swelling. Tenderness found.       Cervical back: She exhibits pain.  Expected osteoarthritis, stiffness; calves soft, supple. Negative Homan's sign  Neurological: She is oriented to person, place, and time. She has normal strength. She appears lethargic.  Skin: Skin is warm and dry. Laceration (right hip- s/p ORIF) noted. No rash noted. She is not diaphoretic. No cyanosis or erythema. No pallor. Nails show no clubbing.  Psychiatric: Her speech is normal and behavior is normal. Judgment and thought content normal. Her mood appears anxious. Cognition and memory are impaired. She exhibits a depressed mood. She exhibits abnormal recent memory.  Nursing note and vitals reviewed.   Labs reviewed:  Recent Labs  09/11/16 0451 09/12/16 0346 09/25/16 1650  NA 138 136 135  K 4.2 4.3 4.3  CL 106 105 100*  CO2 28 24 26   GLUCOSE 112* 112* 86  BUN 25* 25* 31*  CREATININE 1.24* 1.25* 1.28*  CALCIUM 8.2* 8.4* 9.3    Recent Labs  06/14/16 1148 07/04/16 1705 09/25/16 1650  AST 30 30 40  ALT 23 21 26   ALKPHOS 64 61 93  BILITOT 0.5 0.6 0.7  PROT 6.6 6.5 6.5  ALBUMIN 4.2 3.9 3.6    Recent Labs  07/04/16 1705  09/10/16 0715   09/12/16 0346 09/13/16 0320 09/25/16 1650  WBC 7.1  < > 11.2*  < > 10.1 11.1* 10.8  NEUTROABS 5.2  --  9.2*  --   --   --  6.9*  HGB 11.1*  < > 10.8*  < > 8.7* 8.2* 9.8*  HCT 33.4*  < > 32.6*  < > 25.4* 24.1* 29.5*  MCV 95.1  < >  94.0  < > 91.1 93.1 94.7  PLT 226  < > 254  < > 158 173 548*  < > = values in this interval not displayed. Lab Results  Component Value Date   TSH 3.958 07/04/2016   No results found for: HGBA1C No results found for: CHOL, HDL, LDLCALC, LDLDIRECT, TRIG, CHOLHDL  Significant Diagnostic Results in last 30 days:  Dg Chest Portable 1 View  Result Date: 09/10/2016 CLINICAL DATA:  Pain following fall EXAM: PORTABLE CHEST 1 VIEW COMPARISON:  August 11, 2016 FINDINGS: There is mild scarring in the left base. There is no edema or consolidation. Heart is mildly enlarged with pulmonary vascularity within normal limits. No adenopathy. No pneumothorax. Bones are osteoporotic. IMPRESSION: Mild scarring left base. No edema or consolidation. Stable cardiac prominence. There is aortic atherosclerosis. Aortic Atherosclerosis (ICD10-I70.0). Electronically Signed   By: Bretta BangWilliam  Woodruff III M.D.   On: 09/10/2016 08:25   Dg Hip Operative Unilat W Or W/o Pelvis Right  Result Date: 09/10/2016 CLINICAL DATA:  Elective surgery.  Right hip fracture. EXAM: OPERATIVE RIGHT HIP (WITH PELVIS IF PERFORMED) 2VIEWS TECHNIQUE: Fluoroscopic spot image(s) were submitted for interpretation post-operatively. COMPARISON:  Preoperative radiographs yesterday. FINDINGS: Four fluoroscopic spot images obtained in the operating room with intramedullary nail, and distal locking and proximal lag screw fixating intertrochanteric right femur fracture. Fracture is in improved alignment compared to preoperative exam. Fluoroscopy time 2 minutes 2 seconds. IMPRESSION: Fluoroscopic spot views post ORIF intertrochanteric right femur fracture. Electronically Signed   By: Rubye OaksMelanie  Ehinger M.D.   On: 09/10/2016 22:13   Dg  Hip Unilat With Pelvis 2-3 Views Right  Result Date: 09/10/2016 CLINICAL DATA:  Pain following fall EXAM: DG HIP (WITH OR WITHOUT PELVIS) 2-3V RIGHT COMPARISON:  None. FINDINGS: Frontal pelvis as well as frontal and lateral right hip images were obtained. There is a comminuted intertrochanteric femur fracture on the right with varus angulation the fracture site. There is mild impaction at the fracture site. There is avulsion medially of the lesser trochanter. No other fracture. No dislocation. There is slight symmetric narrowing of both hip joints. Bones are osteoporotic. IMPRESSION: Comminuted fracture intertrochanteric region on the right with medial displacement of the lesser trochanter. Mild varus angulation at fracture site. No other fractures. No dislocations. Mild symmetric narrowing of both hip joints. Bones are osteoporotic. Electronically Signed   By: Bretta BangWilliam  Woodruff III M.D.   On: 09/10/2016 08:24   Dg Femur, Min 2 Views Right  Result Date: 09/10/2016 CLINICAL DATA:  83 y/o  F; postop right hip fracture. EXAM: RIGHT FEMUR 2 VIEWS COMPARISON:  09/10/2016 right hip radiographs FINDINGS: Right femur intramedullary rod and proximal threaded screw traversing femoral neck. Avulsion fracture of the lesser trochanter with medial displacement. Air and edema within soft tissues compatible with postsurgical change and lateral thigh skin staples. No new acute fracture identified. IMPRESSION: Right proximal femoral comminuted intratrochanteric fracture fixation with expected postsurgical changes. No new acute fracture. Electronically Signed   By: Mitzi HansenLance  Furusawa-Stratton M.D.   On: 09/10/2016 21:22    Assessment/Plan 1. Pain, acute d/t trauma  Xrays  Continue scheduled APAP 650 mg po QID  Continue Methocarbamol 500 mg po Q 6 hours prn  Continue Tramadol 50 mg po Q 4 hours prn breakthrough pain  2. Fall, initial encounter  Safety precautions  Encourage pt to call for assistance  Family/  staff Communication:   Total Time:  Documentation:  Face to Face:  Family/Phone:   Labs/tests ordered:  Complete  C-spine Xrays and Complete Right Hip, pelvis and femur xrays  Medication list reviewed and assessed for continued appropriateness.  Brynda Rim, NP-C Geriatrics [facility] [phone] [address], [place] [address] Cell Phone (Mon-Fri 8am-5pm):  [phone] On Call:  [phone] & follow prompts after 5pm & weekends Office Phone:  [phone] Office Fax:  [phone]

## 2016-10-04 ENCOUNTER — Encounter: Payer: Self-pay | Admitting: Gerontology

## 2016-10-04 ENCOUNTER — Non-Acute Institutional Stay (SKILLED_NURSING_FACILITY): Payer: Medicare HMO | Admitting: Gerontology

## 2016-10-04 DIAGNOSIS — G8911 Acute pain due to trauma: Secondary | ICD-10-CM | POA: Diagnosis not present

## 2016-10-04 DIAGNOSIS — S72001D Fracture of unspecified part of neck of right femur, subsequent encounter for closed fracture with routine healing: Secondary | ICD-10-CM | POA: Diagnosis not present

## 2016-10-04 NOTE — Progress Notes (Signed)
Location:   [facility] Room Number: 206A Place of Service:  SNF [phone]) Provider:  Lorenso QuarryShannon Verlan Grotz, NP-C  Lynnea FerrierKlein, Bert J III, MD  Patient Care Team: Lynnea FerrierKlein, Bert J III, MD as PCP - General (Internal Medicine)  Extended Emergency Contact Information Primary Emergency Contact: Combs,Carol [place] [id number] [place] Home Phone: [phone] Relation: Daughter Secondary Emergency Contact: Delma FreezeFaucette,John  [place] Home Phone: [phone] Relation: Son  Code Status:  FULL Goals of care: Advanced Directive information Advanced Directives 10/04/2016  Does Patient Have a Medical Advance Directive? No  Type of Advance Directive -  Does patient want to make changes to medical advance directive? -  Copy of Healthcare Power of Attorney in Chart? -  Would patient like information on creating a medical advance directive? -     Chief Complaint  Patient presents with  . Acute Visit    Follow up on pain    HPI:  Pt is a 83 y.o. female seen today for an acute visit for worsening pain in the right hip. Pt sustained a fall 2 days ago. Xrays done after the fall were negative for new fracture or hardware alterations. Today, pt yelling out and moaning in severe pain- worse with minimal movement. Nursing concerned about abnormal appearance of trochanter and ischium when pt hip is flexed (using the toilet.) Pt also c/o severe point tenderness on the area of abnormal appearance. No visible bruising. No crepitus, no foot rotation. Will re-xray hip to r/o occult fracture. VSS.    Past Medical History:  Diagnosis Date  . Anemia, unspecified   . Anxiety   . Atrioventricular block 06/09/2008   1st degree with bradycardia  . Cardiac arrest (HCC)   . Chicken pox   . CKD (chronic kidney disease) stage 3, GFR 30-59 ml/min   . Diverticulosis   . History of cardiac arrest    during prior cataract surgery, thought to be from anesthesia, per pt. Followed by Dr.  Darrold JunkerParaschos  . Hyperlipidemia, unspecified   . Hypertension   . Hypothyroidism, unspecified   . Osteopenia   . Renal disorder    Past Surgical History:  Procedure Laterality Date  . APPENDECTOMY  1970  . CATARACT EXTRACTION, BILATERAL    . COLONOSCOPY  04/29/2006  . FOOT SURGERY Bilateral   . INTRAMEDULLARY (IM) NAIL INTERTROCHANTERIC Right 09/10/2016   Procedure: INTRAMEDULLARY (IM) NAIL INTERTROCHANTRIC;  Surgeon: Juanell FairlyKrasinski, Kevin, MD;  Location: [facility] ORS;  Service: Orthopedics;  Laterality: Right;  . TUBAL LIGATION Bilateral 1970    Allergies  Allergen Reactions  . Alendronate     unkown  . Lidocaine     Cardiac Arrest  . Phenylephrine   . Phenyltoloxamine-Acetaminophen   . Azithromycin Rash    Allergies as of 10/04/2016      Reactions   Alendronate    unkown   Lidocaine    Cardiac Arrest   Phenylephrine    Phenyltoloxamine-acetaminophen    Azithromycin Rash      Medication List       Accurate as of 10/04/16  3:08 PM. Always use your most recent med list.          acetaminophen 325 MG tablet Commonly known as:  TYLENOL Take 650 mg by mouth 4 (four) times daily.   acetaminophen 325 MG tablet Commonly known as:  TYLENOL Take 2 tablets (650 mg total) by mouth every 6 (six) hours as needed for mild pain (or Fever >/= 101).  ASPERCREME LIDOCAINE 4 % Ptch Generic drug:  Lidocaine Apply 1 patch topically daily. Apply to area of pain usually either the inner groin or the right knee. If patient wants, may cut patch in half and use a piece on each site. Remove after 12 hours.   aspirin EC 81 MG tablet Take 1 tablet by mouth daily.   busPIRone 15 MG tablet Commonly known as:  BUSPAR Take 1 tablet by mouth daily.   Calcium Carbonate-Vitamin D 600-400 MG-UNIT tablet Take 1 tablet by mouth daily.   Cholecalciferol 4000 units Caps Take 1 capsule by mouth daily.   citalopram 10 MG tablet Commonly known as:  CELEXA Take 1 tablet (10 mg total) by mouth every  other day.   docusate sodium 100 MG capsule Commonly known as:  COLACE Take 1 capsule (100 mg total) by mouth 2 (two) times daily.   donepezil 5 MG tablet Commonly known as:  ARICEPT Take 1 tablet by mouth daily.   enoxaparin 30 MG/0.3ML injection Commonly known as:  LOVENOX Inject 0.3 mLs (30 mg total) into the skin daily.   FISH OIL PO Take 1 tablet by mouth daily.   levothyroxine 75 MCG tablet Commonly known as:  SYNTHROID, LEVOTHROID Take 1 tablet by mouth daily before breakfast.   Lifitegrast 5 % Soln Place 1 drop into both eyes 2 (two) times daily.   methocarbamol 500 MG tablet Commonly known as:  ROBAXIN Take 1 tablet (500 mg total) by mouth every 6 (six) hours as needed for muscle spasms.   mirabegron ER 25 MG Tb24 tablet Commonly known as:  MYRBETRIQ Take 1 tablet (25 mg total) by mouth daily.   multivitamin tablet Take 1 tablet by mouth daily.   traMADol 50 MG tablet Commonly known as:  ULTRAM Take 50 mg by mouth every 4 (four) hours as needed.   traMADol 50 MG tablet Commonly known as:  ULTRAM Take 50 mg by mouth 4 (four) times daily. Scheduled for pain. Ok to give prn doses in addition to scheduled for severe pain       Review of Systems  Constitutional: Negative for activity change, appetite change, chills, diaphoresis and fever.  HENT: Negative for congestion, sneezing, sore throat, trouble swallowing and voice change.   Respiratory: Negative for apnea, cough, choking, chest tightness, shortness of breath and wheezing.   Cardiovascular: Negative for chest pain, palpitations and leg swelling.  Gastrointestinal: Negative for abdominal distention, abdominal pain, constipation, diarrhea and nausea.  Genitourinary: Negative for difficulty urinating, dysuria, frequency and urgency.  Musculoskeletal: Positive for arthralgias (typical arthritis), back pain, gait problem, joint swelling and myalgias.  Skin: Negative for color change, pallor, rash and wound.    Neurological: Positive for weakness. Negative for dizziness, tremors, syncope, speech difficulty, numbness and headaches.  Psychiatric/Behavioral: Positive for confusion and dysphoric mood. Negative for agitation and behavioral problems. The patient is not nervous/anxious.   All other systems reviewed and are negative.   Immunization History  Administered Date(s) Administered  . Influenza-Unspecified 12/18/2011, 12/18/2012, 11/24/2015  . PPD Test 07/09/2016  . Pneumococcal Conjugate-13 07/21/2015  . Pneumococcal Polysaccharide-23 01/11/2003, 06/03/2008, 07/13/2013  . Td 05/29/2008  . Varicella 07/21/2015   Pertinent  Health Maintenance Due  Topic Date Due  . DEXA SCAN  11/23/1997  . INFLUENZA VACCINE  10/02/2016  . PNA vac Low Risk Adult  Completed   No flowsheet data found. Functional Status Survey:    Vitals:   10/04/16 1458  BP: 129/76  Pulse: 70  Resp:  19  Temp: 97.6 F (36.4 C)  SpO2: 97%  Weight: 105 lb 12.8 oz (48 kg)  Height: 5\' 1"  (1.549 m)   Body mass index is 19.99 kg/m. Physical Exam  Constitutional: She is oriented to person, place, and time. Vital signs are normal. She appears well-developed and well-nourished. She appears lethargic. She is active and cooperative. She appears ill. No distress.  HENT:  Head: Normocephalic and atraumatic.  Mouth/Throat: Uvula is midline, oropharynx is clear and moist and mucous membranes are normal. Mucous membranes are not pale, not dry and not cyanotic.  Eyes: Pupils are equal, round, and reactive to light. Conjunctivae, EOM and lids are normal.  Neck: Trachea normal, normal range of motion and full passive range of motion without pain. Neck supple. No JVD present. No tracheal deviation, no edema and no erythema present. No thyromegaly present.  Cardiovascular: Normal rate, regular rhythm, normal heart sounds and intact distal pulses.  Exam reveals no gallop, no distant heart sounds and no friction rub.   No murmur  heard. Pulses:      Dorsalis pedis pulses are 1+ on the right side, and 1+ on the left side.  Minimal edema in BLE/ ankles  Pulmonary/Chest: Effort normal and breath sounds normal. No accessory muscle usage. No respiratory distress. She has no decreased breath sounds. She has no wheezes. She has no rhonchi. She has no rales. She exhibits no tenderness.  Abdominal: Soft. Normal appearance. She exhibits no distension and no ascites. Bowel sounds are decreased. There is no tenderness.  Musculoskeletal: She exhibits no edema.       Right hip: She exhibits decreased range of motion, decreased strength, tenderness, swelling and laceration.       Right knee: She exhibits decreased range of motion, swelling and deformity (possible). Tenderness found.       Cervical back: She exhibits pain.  Expected osteoarthritis, stiffness; calves soft, supple. Negative Homan's sign  Neurological: She is oriented to person, place, and time. She has normal strength. She appears lethargic.  Skin: Skin is warm and dry. Laceration (right hip- s/p ORIF) noted. No rash noted. She is not diaphoretic. No cyanosis or erythema. No pallor. Nails show no clubbing.  Psychiatric: Her speech is normal and behavior is normal. Judgment and thought content normal. Her mood appears anxious. Cognition and memory are impaired. She exhibits a depressed mood. She exhibits abnormal recent memory.  Nursing note and vitals reviewed.   Labs reviewed:  Recent Labs  09/11/16 0451 09/12/16 0346 09/25/16 1650  NA 138 136 135  K 4.2 4.3 4.3  CL 106 105 100*  CO2 28 24 26   GLUCOSE 112* 112* 86  BUN 25* 25* 31*  CREATININE 1.24* 1.25* 1.28*  CALCIUM 8.2* 8.4* 9.3    Recent Labs  06/14/16 1148 07/04/16 1705 09/25/16 1650  AST 30 30 40  ALT 23 21 26   ALKPHOS 64 61 93  BILITOT 0.5 0.6 0.7  PROT 6.6 6.5 6.5  ALBUMIN 4.2 3.9 3.6    Recent Labs  07/04/16 1705  09/10/16 0715  09/12/16 0346 09/13/16 0320 09/25/16 1650  WBC 7.1   < > 11.2*  < > 10.1 11.1* 10.8  NEUTROABS 5.2  --  9.2*  --   --   --  6.9*  HGB 11.1*  < > 10.8*  < > 8.7* 8.2* 9.8*  HCT 33.4*  < > 32.6*  < > 25.4* 24.1* 29.5*  MCV 95.1  < > 94.0  < > 91.1 93.1  94.7  PLT 226  < > 254  < > 158 173 548*  < > = values in this interval not displayed. Lab Results  Component Value Date   TSH 3.958 07/04/2016   No results found for: HGBA1C No results found for: CHOL, HDL, LDLCALC, LDLDIRECT, TRIG, CHOLHDL  Significant Diagnostic Results in last 30 days:  Dg Chest Portable 1 View  Result Date: 09/10/2016 CLINICAL DATA:  Pain following fall EXAM: PORTABLE CHEST 1 VIEW COMPARISON:  August 11, 2016 FINDINGS: There is mild scarring in the left base. There is no edema or consolidation. Heart is mildly enlarged with pulmonary vascularity within normal limits. No adenopathy. No pneumothorax. Bones are osteoporotic. IMPRESSION: Mild scarring left base. No edema or consolidation. Stable cardiac prominence. There is aortic atherosclerosis. Aortic Atherosclerosis (ICD10-I70.0). Electronically Signed   By: Bretta BangWilliam  Woodruff III M.D.   On: 09/10/2016 08:25   Dg Hip Operative Unilat W Or W/o Pelvis Right  Result Date: 09/10/2016 CLINICAL DATA:  Elective surgery.  Right hip fracture. EXAM: OPERATIVE RIGHT HIP (WITH PELVIS IF PERFORMED) 2VIEWS TECHNIQUE: Fluoroscopic spot image(s) were submitted for interpretation post-operatively. COMPARISON:  Preoperative radiographs yesterday. FINDINGS: Four fluoroscopic spot images obtained in the operating room with intramedullary nail, and distal locking and proximal lag screw fixating intertrochanteric right femur fracture. Fracture is in improved alignment compared to preoperative exam. Fluoroscopy time 2 minutes 2 seconds. IMPRESSION: Fluoroscopic spot views post ORIF intertrochanteric right femur fracture. Electronically Signed   By: Rubye OaksMelanie  Ehinger M.D.   On: 09/10/2016 22:13   Dg Hip Unilat With Pelvis 2-3 Views Right  Result Date:  09/10/2016 CLINICAL DATA:  Pain following fall EXAM: DG HIP (WITH OR WITHOUT PELVIS) 2-3V RIGHT COMPARISON:  None. FINDINGS: Frontal pelvis as well as frontal and lateral right hip images were obtained. There is a comminuted intertrochanteric femur fracture on the right with varus angulation the fracture site. There is mild impaction at the fracture site. There is avulsion medially of the lesser trochanter. No other fracture. No dislocation. There is slight symmetric narrowing of both hip joints. Bones are osteoporotic. IMPRESSION: Comminuted fracture intertrochanteric region on the right with medial displacement of the lesser trochanter. Mild varus angulation at fracture site. No other fractures. No dislocations. Mild symmetric narrowing of both hip joints. Bones are osteoporotic. Electronically Signed   By: Bretta BangWilliam  Woodruff III M.D.   On: 09/10/2016 08:24   Dg Femur, Min 2 Views Right  Result Date: 09/10/2016 CLINICAL DATA:  83 y/o  F; postop right hip fracture. EXAM: RIGHT FEMUR 2 VIEWS COMPARISON:  09/10/2016 right hip radiographs FINDINGS: Right femur intramedullary rod and proximal threaded screw traversing femoral neck. Avulsion fracture of the lesser trochanter with medial displacement. Air and edema within soft tissues compatible with postsurgical change and lateral thigh skin staples. No new acute fracture identified. IMPRESSION: Right proximal femoral comminuted intratrochanteric fracture fixation with expected postsurgical changes. No new acute fracture. Electronically Signed   By: Mitzi HansenLance  Furusawa-Stratton M.D.   On: 09/10/2016 21:22    Assessment/Plan 1. Pain, acute d/t trauma  Xrays  Continue scheduled APAP 650 mg po QID  Continue Methocarbamol 500 mg po Q 6 hours prn  Continue Tramadol 50 mg po Q 4 hours prn breakthrough pain  Add Tramadol 50 mg po QID scheduled for pain  2.Closed fracture of right hip with routine healing, subsequent encounter  Continue PT/OT  Lovenox 30 mg  SQ Q Day  Ice to the hip prn for pain, edema  Schedule Tylenol  650 mg po QID for pain  Tramadol 50 mg po Q 6 hours prn severe pain  Add Tramadol 50 mg po QID scheduled for pain  Family/ staff Communication:   Total Time:  Documentation:  Face to Face:  Family/Phone:   Labs/tests ordered:  Right Hip Xray  Medication list reviewed and assessed for continued appropriateness.  Brynda Rim, NP-C Geriatrics [facility] [phone], [place] [address] Cell Phone (Mon-Fri 8am-5pm):  [phone] On Call:  [phone] & follow prompts after 5pm & weekends Office Phone:  [phone] Office Fax:  [phone]

## 2016-10-06 ENCOUNTER — Emergency Department
Admission: EM | Admit: 2016-10-06 | Discharge: 2016-10-06 | Disposition: A | Discharge status: Home / Self Care | Payer: Medicare HMO | Attending: Emergency Medicine | Admitting: Emergency Medicine

## 2016-10-06 ENCOUNTER — Emergency Department: Payer: Medicare HMO

## 2016-10-06 DIAGNOSIS — Z7982 Long term (current) use of aspirin: Secondary | ICD-10-CM | POA: Insufficient documentation

## 2016-10-06 DIAGNOSIS — M47816 Spondylosis without myelopathy or radiculopathy, lumbar region: Secondary | ICD-10-CM | POA: Diagnosis not present

## 2016-10-06 DIAGNOSIS — N183 Chronic kidney disease, stage 3 (moderate): Secondary | ICD-10-CM | POA: Diagnosis not present

## 2016-10-06 DIAGNOSIS — S7011XA Contusion of right thigh, initial encounter: Secondary | ICD-10-CM

## 2016-10-06 DIAGNOSIS — Y939 Activity, unspecified: Secondary | ICD-10-CM | POA: Insufficient documentation

## 2016-10-06 DIAGNOSIS — M545 Low back pain: Secondary | ICD-10-CM | POA: Diagnosis not present

## 2016-10-06 DIAGNOSIS — Y999 Unspecified external cause status: Secondary | ICD-10-CM | POA: Insufficient documentation

## 2016-10-06 DIAGNOSIS — Z79899 Other long term (current) drug therapy: Secondary | ICD-10-CM | POA: Diagnosis not present

## 2016-10-06 DIAGNOSIS — Z8781 Personal history of (healed) traumatic fracture: Secondary | ICD-10-CM | POA: Insufficient documentation

## 2016-10-06 DIAGNOSIS — I129 Hypertensive chronic kidney disease with stage 1 through stage 4 chronic kidney disease, or unspecified chronic kidney disease: Secondary | ICD-10-CM | POA: Insufficient documentation

## 2016-10-06 DIAGNOSIS — W19XXXA Unspecified fall, initial encounter: Secondary | ICD-10-CM | POA: Insufficient documentation

## 2016-10-06 DIAGNOSIS — S7001XA Contusion of right hip, initial encounter: Secondary | ICD-10-CM | POA: Diagnosis not present

## 2016-10-06 DIAGNOSIS — Y929 Unspecified place or not applicable: Secondary | ICD-10-CM | POA: Insufficient documentation

## 2016-10-06 LAB — BASIC METABOLIC PANEL
ANION GAP: 5 (ref 5–15)
BUN: 24 mg/dL — ABNORMAL HIGH (ref 6–20)
CHLORIDE: 101 mmol/L (ref 101–111)
CO2: 29 mmol/L (ref 22–32)
Calcium: 9.6 mg/dL (ref 8.9–10.3)
Creatinine, Ser: 1.13 mg/dL — ABNORMAL HIGH (ref 0.44–1.00)
GFR calc Af Amer: 51 mL/min — ABNORMAL LOW (ref >60)
GFR calc non Af Amer: 44 mL/min — ABNORMAL LOW (ref >60)
GLUCOSE: 111 mg/dL — AB (ref 65–99)
POTASSIUM: 4 mmol/L (ref 3.5–5.1)
Sodium: 135 mmol/L (ref 135–145)

## 2016-10-06 LAB — PROTIME-INR
INR: 1.02
PROTHROMBIN TIME: 13.4 s (ref 11.4–15.2)

## 2016-10-06 LAB — CBC
HEMATOCRIT: 29.9 % — AB (ref 35.0–47.0)
HEMOGLOBIN: 10.2 g/dL — AB (ref 12.0–16.0)
MCH: 32.3 pg (ref 26.0–34.0)
MCHC: 33.9 g/dL (ref 32.0–36.0)
MCV: 95.1 fL (ref 80.0–100.0)
Platelets: 306 10*3/uL (ref 150–440)
RBC: 3.15 MIL/uL — AB (ref 3.80–5.20)
RDW: 14.9 % — ABNORMAL HIGH (ref 11.5–14.5)
WBC: 7.9 10*3/uL (ref 3.6–11.0)

## 2016-10-06 LAB — APTT: APTT: 34 s (ref 24–36)

## 2016-10-06 NOTE — ED Provider Notes (Signed)
[facility] Emergency Department Provider Note   ____________________________________________   First MD Initiated Contact with Patient 10/06/16 1423     (approximate)  I have reviewed the triage vital signs and the nursing notes.   HISTORY  Chief Complaint Fall    HPI Carrie Calhoun is a 83 y.o. female reports she fell on Tuesday or getting up during the evening. She's been having pain in the right hip area since. Had a recent rod placed to the right hip after a hip fracture or couple weeks ago  She reports that she's been able to walk, but it is painful to walk especially on the right hip. After receiving pain with her medicine with EMS she reports her pain is improved.  She has seen some bruising over the right thigh since the fall on Tuesday. Decreased mobility. Able to walk on but with pain in the right hip and right upper leg. Denies back pain but reports a "achy feeling" between the buttock cheeks.  No numbness or weakness.  No chest pain, no fevers or chills, no trouble breathing. Continuing to eat and drink normally.  Past Medical History:  Diagnosis Date  . Anemia, unspecified   . Anxiety   . Atrioventricular block 06/09/2008   1st degree with bradycardia  . Cardiac arrest (HCC)   . Chicken pox   . CKD (chronic kidney disease) stage 3, GFR 30-59 ml/min   . Diverticulosis   . History of cardiac arrest    during prior cataract surgery, thought to be from anesthesia, per pt. Followed by Dr. Darrold JunkerParaschos  . Hyperlipidemia, unspecified   . Hypertension   . Hypothyroidism, unspecified   . Osteopenia   . Renal disorder     Patient Active Problem List   Diagnosis Date Noted  . DNR (do not resuscitate) 09/12/2016  . Hip fracture (HCC) 09/10/2016  . Osteopenia 09/02/2016  . Hyperlipidemia, unspecified 09/02/2016  . DDD (degenerative disc disease), lumbar 09/02/2016  . CKD (chronic kidney disease), stage III 09/02/2016  . Anemia,  unspecified 09/02/2016  . Ataxia 07/22/2016  . Dementia arising in the senium and presenium 07/15/2016  . Multiple rib fractures 07/04/2016  . Moderate major depression, single episode (HCC) 06/14/2016  . Difficulty walking 01/08/2016  . Hypothyroidism due to acquired atrophy of thyroid 07/21/2015  . Paresthesia of both hands 06/28/2014  . Bilateral carpal tunnel syndrome 06/28/2014  . Tremor 12/28/2013  . Restless legs 12/28/2013  . Loss of memory 12/28/2013  . Essential hypertension 12/28/2013    Past Surgical History:  Procedure Laterality Date  . APPENDECTOMY  1970  . CATARACT EXTRACTION, BILATERAL    . COLONOSCOPY  04/29/2006  . FOOT SURGERY Bilateral   . INTRAMEDULLARY (IM) NAIL INTERTROCHANTERIC Right 09/10/2016   Procedure: INTRAMEDULLARY (IM) NAIL INTERTROCHANTRIC;  Surgeon: Juanell FairlyKrasinski, Kevin, MD;  Location: [facility] ORS;  Service: Orthopedics;  Laterality: Right;  . TUBAL LIGATION Bilateral 1970    Prior to Admission medications   Medication Sig Start Date End Date Taking? Authorizing Provider  acetaminophen (TYLENOL) 325 MG tablet Take 2 tablets (650 mg total) by mouth every 6 (six) hours as needed for mild pain (or Fever >/= 101). 07/08/16   Milagros LollSudini, Srikar, MD  acetaminophen (TYLENOL) 325 MG tablet Take 650 mg by mouth 4 (four) times daily.    [provider]  aspirin EC 81 MG tablet Take 1 tablet by mouth daily.    [provider]  busPIRone (BUSPAR) 15 MG tablet Take 1 tablet  by mouth daily.  05/13/16   [provider]  Calcium Carbonate-Vitamin D 600-400 MG-UNIT tablet Take 1 tablet by mouth daily.     [provider]  Cholecalciferol 4000 units CAPS Take 1 capsule by mouth daily.    [provider]  citalopram (CELEXA) 10 MG tablet Take 1 tablet (10 mg total) by mouth every other day. 07/08/16   Milagros Loll, MD  docusate sodium (COLACE) 100 MG capsule Take 1 capsule (100 mg total) by mouth 2 (two) times daily. 09/13/16   Milagros Loll, MD  donepezil (ARICEPT) 5 MG tablet Take 1 tablet by mouth daily.  05/13/16   [provider]  enoxaparin (LOVENOX) 30 MG/0.3ML injection Inject 0.3 mLs (30 mg total) into the skin daily. 09/13/16   Milagros Loll, MD  levothyroxine (SYNTHROID, LEVOTHROID) 75 MCG tablet Take 1 tablet by mouth daily before breakfast.  03/11/16   [provider]  Lidocaine (ASPERCREME LIDOCAINE) 4 % PTCH Apply 1 patch topically daily. Apply to area of pain usually either the inner groin or the right knee. If patient wants, may cut patch in half and use a piece on each site. Remove after 12 hours.    [provider]  Lifitegrast 5 % SOLN Place 1 drop into both eyes 2 (two) times daily.     [provider]  methocarbamol (ROBAXIN) 500 MG tablet Take 1 tablet (500 mg total) by mouth every 6 (six) hours as needed for muscle spasms. 09/13/16   Milagros Loll, MD  mirabegron ER (MYRBETRIQ) 25 MG TB24 tablet Take 1 tablet (25 mg total) by mouth daily. 09/02/16   Michiel Cowboy A, PA-C  Multiple Vitamin (MULTIVITAMIN) tablet Take 1 tablet by mouth daily.    [provider]  Omega-3 Fatty Acids (FISH OIL PO) Take 1 tablet by mouth daily.    [provider]  traMADol (ULTRAM) 50 MG tablet Take 50 mg by mouth every 4 (four) hours as needed.    [provider]  traMADol (ULTRAM) 50 MG tablet Take 50 mg by mouth 4 (four) times daily. Scheduled for pain. Ok to give prn doses in addition to scheduled for severe pain    [provider]    Allergies Alendronate; Lidocaine; Phenylephrine; Phenyltoloxamine-acetaminophen; and Azithromycin  Family History  Problem Relation Age of Onset  . Hypertension Mother   . Stroke Mother   . Osteoporosis Mother   . Thyroid disease Mother   . Breast cancer Maternal Aunt   . Breast cancer Maternal Aunt     Social History Social History  Substance Use Topics  . Smoking status: Never Smoker  . Smokeless tobacco:  Never Used  . Alcohol use No    Review of Systems Constitutional: No fever/chills Eyes: No visual changes. ENT: No sore throat. Cardiovascular: Denies chest pain. Respiratory: Denies shortness of breath. Gastrointestinal: No abdominal pain.  No nausea, no vomiting.  No diarrhea.  No constipation. Genitourinary: Negative for dysuria. Musculoskeletal: See history of present illness. No injury to the neck. Did not strike her head. No headache. Skin: Negative for rash. Neurological: Negative for headaches, focal weakness or numbness.    ____________________________________________   PHYSICAL EXAM:  VITAL SIGNS: ED Triage Vitals  Enc Vitals Group     BP 10/06/16 1339 126/72     Pulse Rate 10/06/16 1339 76     Resp 10/06/16 1339 16     Temp 10/06/16 1339 97.9 F (36.6 C)     Temp Source 10/06/16 1339  Oral     SpO2 10/06/16 1339 99 %     Weight 10/06/16 1340 105 lb 3 oz (47.7 kg)     Height 10/06/16 1340 5' (1.524 m)     Head Circumference --      Peak Flow --      Pain Score --      Pain Loc --      Pain Edu? --      Excl. in GC? --     Constitutional: Alert and oriented. Well appearing and in no acute distress. Eyes: Conjunctivae are normal. Head: Atraumatic. Nose: No congestion/rhinnorhea. Mouth/Throat: Mucous membranes are moist. Neck: No stridor.  No cervical tenderness. Cardiovascular: Normal rate, regular rhythm. Grossly normal heart sounds.  Good peripheral circulation. Respiratory: Normal respiratory effort.  No retractions. Lungs CTAB. Gastrointestinal: Soft and nontender. No distention. Musculoskeletal:  Lower Extremities  No edema. Normal DP/PT pulses bilateral with good cap refill.  Normal neuro-motor function lower extremities bilateral.  RIGHT Right lower extremity demonstrates limited strength, good use of all muscles. No obvious neurologic or muscular deficits. No edema bruising or contusions of the right hip, right knee, right ankle. There is  bruising over the medial right thigh. She reports mild pain on axial loading in the area of the right hip. Attempting to arrange the right hip does induce discomfort in the right lateral hip region.   LEFT Left lower extremity demonstrates normal strength, good use of all muscles. No edema bruising or contusions of the hip,  knee, ankle. Full range of motion of the left lower extremity without pain. No pain on axial loading. No evidence of trauma.   Neurologic:  Normal speech and language. No gross focal neurologic deficits are appreciated.  Skin:  Skin is warm, dry and intact. No rash noted. Psychiatric: Mood and affect are normal. Speech and behavior are normal.  ____________________________________________   LABS (all labs ordered are listed, but only abnormal results are displayed)  Labs Reviewed  CBC - Abnormal; Notable for the following:       Result Value   RBC 3.15 (*)    Hemoglobin 10.2 (*)    HCT 29.9 (*)    RDW 14.9 (*)    All other components within normal limits  BASIC METABOLIC PANEL - Abnormal; Notable for the following:    Glucose, Bld 111 (*)    BUN 24 (*)    Creatinine, Ser 1.13 (*)    GFR calc non Af Amer 44 (*)    GFR calc Af Amer 51 (*)    All other components within normal limits  PROTIME-INR  APTT   ____________________________________________  EKG   ____________________________________________  RADIOLOGY  Dg Lumbar Spine 2-3 Views  Result Date: 10/06/2016 CLINICAL DATA:  Pt fell 5 days ago and has since had lower back and RT hip pain; mobile x-rays at nursing home implied presence of new fx in RT hip; pt had surgery on RT hip 09-10-16 EXAM: LUMBAR SPINE - 2-3 VIEW COMPARISON:  MRI lumbar spine 03/01/2010 FINDINGS: No acute loss of vertebral body height. There is sigmoid scoliosis lumbar spine. There is multiple levels of joint space narrowing and endplate spurring rest from MRI of 2011. No subluxation. IMPRESSION: 1. No acute findings lumbar spine. 2.  Multilevel disc osteophytic disease related to osteoarthritis and scoliosis. Electronically Signed   By: Genevive Bi M.D.   On: 10/06/2016 15:15   Dg Hip Unilat W Or Wo Pelvis 2-3 Views Right  Result Date: 10/06/2016 CLINICAL  DATA:  Low back pain and RIGHT hip pain. EXAM: DG HIP (WITH OR WITHOUT PELVIS) 2-3V RIGHT COMPARISON:  09/10/2016 FINDINGS: Intramedullary nail fixation of intratrochanteric fracture of the RIGHT femoral neck. Distal interlocking screw noted. Avulsion of the lesser trochanter noted. No complicating features. IMPRESSION: Intramedullary nail fixation of RIGHT intertrochanteric fracture without complication. Electronically Signed   By: Genevive BiStewart  Edmunds M.D.   On: 10/06/2016 15:12    ____________________________________________   PROCEDURES  Procedure(s) performed: None  Procedures  Critical Care performed: No  ____________________________________________   INITIAL IMPRESSION / ASSESSMENT AND PLAN / ED COURSE  Pertinent labs & imaging results that were available during my care of the patient were reviewed by me and considered in my medical decision making (see chart for details).  Patient presents for right hip pain after fall on Tuesday. Mobile x-rays indicate a possible fracture.  She does have tenderness around the right hip, but also has a recent history of hip surgery. There is no shortening or rotation to suggest obvious new fracture.  No systemic symptoms. Otherwise normal health and has been able to walk on it with assistance and a walker.  Discussed x-rays with Dr. Nechama GuardBauer orthopedic surgery. He advises that the patient hasn't intramedullary nail, reviewed x-rays, he advises she may continue to bear weight as tolerated. They will be able to see her in follow-up in the clinic tomorrow for reevaluation.     Return precautions and treatment recommendations and follow-up discussed with the patient and her daugther who are agreeable with the  plan.  ____________________________________________   FINAL CLINICAL IMPRESSION(S) / ED DIAGNOSES  Final diagnoses:  Fall, initial encounter  Contusion of right thigh, initial encounter  S/P right hip fracture      NEW MEDICATIONS STARTED DURING THIS VISIT:  New Prescriptions   No medications on file     Note:  This document was prepared using Dragon voice recognition software and may include unintentional dictation errors.     Sharyn CreamerQuale, [place], MD 10/06/16 [phone]

## 2016-10-06 NOTE — Discharge Instructions (Signed)
You have been seen in the Emergency Department (ED) today for a fall.  Your work up does not show any concerning injuries.  But she will need to follow up with orthopedics tomorrow. He may have a new fracture, but you are previous surgery is securing it in place. He may bear weight but only as tolerated, and should try to use a walker at all times.  Please follow up with your orthopedics doctor regarding today's Emergency Department (ED) visit and your recent fall.    Return to the ED if you have any worsening pain, weakness, headache, confusion, slurred speech, weakness/numbness of any arm or leg, or any increased pain.

## 2016-10-06 NOTE — ED Notes (Signed)
Unable to get [facility] on phone for transport. [facility].

## 2016-10-06 NOTE — ED Triage Notes (Signed)
[facility].  They did mobile X-Rays and saw a fracture but was not clear where.  Pt was given fentanyl in route.  Pt denies pain while lying down but 10/10 pain in right hip and leg when trying to walk.  Pt is A&Ox4.

## 2016-10-10 ENCOUNTER — Other Ambulatory Visit
Admission: RE | Admit: 2016-10-10 | Discharge: 2016-10-10 | Disposition: A | Discharge status: Home / Self Care | Payer: Medicare HMO | Source: Ambulatory Visit | Attending: Gerontology | Admitting: Gerontology

## 2016-10-10 DIAGNOSIS — R410 Disorientation, unspecified: Secondary | ICD-10-CM | POA: Insufficient documentation

## 2016-10-10 LAB — URINALYSIS, COMPLETE (UACMP) WITH MICROSCOPIC
BACTERIA UA: NONE SEEN
Bilirubin Urine: NEGATIVE
Glucose, UA: NEGATIVE mg/dL
HGB URINE DIPSTICK: NEGATIVE
Ketones, ur: NEGATIVE mg/dL
Nitrite: NEGATIVE
Protein, ur: NEGATIVE mg/dL
SPECIFIC GRAVITY, URINE: 1.026 (ref 1.005–1.030)
pH: 5 (ref 5.0–8.0)

## 2016-10-11 ENCOUNTER — Non-Acute Institutional Stay (SKILLED_NURSING_FACILITY): Payer: Medicare HMO | Admitting: Gerontology

## 2016-10-11 ENCOUNTER — Encounter: Payer: Self-pay | Admitting: Gerontology

## 2016-10-11 DIAGNOSIS — G8911 Acute pain due to trauma: Secondary | ICD-10-CM | POA: Diagnosis not present

## 2016-10-11 DIAGNOSIS — S72001D Fracture of unspecified part of neck of right femur, subsequent encounter for closed fracture with routine healing: Secondary | ICD-10-CM

## 2016-10-11 NOTE — Progress Notes (Signed)
Location:   [facility] Room Number: 206A Place of Service:  SNF [phone]) Provider:  Lorenso Quarry, NP-C  Lynnea Ferrier, MD  Patient Care Team: Lynnea Ferrier, MD as PCP - General (Internal Medicine)  Extended Emergency Contact Information Primary Emergency Contact: Combs,Carol  [address] [place] Home Phone: [phone] Relation: Daughter Secondary Emergency Contact: Delma Freeze [place] Home Phone: [phone] Relation: Son  Code Status:  FULL Goals of care: Advanced Directive information Advanced Directives 10/11/2016  Does Patient Have a Medical Advance Directive? No  Type of Advance Directive -  Does patient want to make changes to medical advance directive? -  Copy of Healthcare Power of Attorney in Chart? -  Would patient like information on creating a medical advance directive? -     Chief Complaint  Patient presents with  . Medical Management of Chronic Issues    Routine Visit    HPI:  Pt is a 83 y.o. female seen today for follow up. Pt was admitted to the facility for rehab following hospitalization for Right hip fracture with subsequent ORIF resulting from a mechanical fall. Pt has had issues with pain control and confusion- likely related to severe pain. Pt has been participating in PT and OT. She has had multiple xrays of the right hip d/t fall in rehab and then worsening pain. Xrays have initially been negative. Second opinion review shows possible additional non-displaced fracture around the hardware. No change in treatment plan, per Ortho. Multiple medication changes have been made to manage pain. Pt seems to be more comfortable at this time. Pt continues to have increased confusion. She had yet another fall 2 days while up to BR unassisted. Again, xrays negative,though a bruise is evident on the right hip. Son requested evaluation of UA d/t the confusion. Otherwise, exam unremarkable. VSS.   Past  Medical History:  Diagnosis Date  . Anemia, unspecified   . Anxiety   . Atrioventricular block 06/09/2008   1st degree with bradycardia  . Cardiac arrest (HCC)   . Chicken pox   . CKD (chronic kidney disease) stage 3, GFR 30-59 ml/min   . Diverticulosis   . History of cardiac arrest    during prior cataract surgery, thought to be from anesthesia, per pt. Followed by Dr. Darrold Junker  . Hyperlipidemia, unspecified   . Hypertension   . Hypothyroidism, unspecified   . Osteopenia   . Renal disorder    Past Surgical History:  Procedure Laterality Date  . APPENDECTOMY  1970  . CATARACT EXTRACTION, BILATERAL    . COLONOSCOPY  04/29/2006  . FOOT SURGERY Bilateral   . INTRAMEDULLARY (IM) NAIL INTERTROCHANTERIC Right 09/10/2016   Procedure: INTRAMEDULLARY (IM) NAIL INTERTROCHANTRIC;  Surgeon: [name], MD;  Location: [facility] ORS;  Service: Orthopedics;  Laterality: Right;  . TUBAL LIGATION Bilateral 1970    Allergies  Allergen Reactions  . Alendronate     unkown  . Lidocaine     Cardiac Arrest  . Phenylephrine   . Phenyltoloxamine-Acetaminophen   . Azithromycin Rash    Allergies as of 10/11/2016      Reactions   Alendronate    unkown   Lidocaine    Cardiac Arrest   Phenylephrine    Phenyltoloxamine-acetaminophen    Azithromycin Rash      Medication List       Accurate as of 10/11/16 10:48 AM. Always use your most recent med list.  acetaminophen 325 MG tablet Commonly known as:  TYLENOL Take 650 mg by mouth 4 (four) times daily.   acetaminophen 325 MG tablet Commonly known as:  TYLENOL Take 2 tablets (650 mg total) by mouth every 6 (six) hours as needed for mild pain (or Fever >/= 101).   ASPERCREME LIDOCAINE 4 % Ptch Generic drug:  Lidocaine Apply 1 patch topically daily. Apply to area of pain usually either the inner groin or the right knee. If patient wants, may cut patch in half and use a piece on each site. Remove after 12 hours.   aspirin EC 81  MG tablet Take 1 tablet by mouth daily.   bisacodyl 10 MG suppository Commonly known as:  DULCOLAX Place 10 mg rectally daily as needed for moderate constipation.   busPIRone 15 MG tablet Commonly known as:  BUSPAR Take 1 tablet by mouth daily.   Calcium Carbonate-Vitamin D 600-400 MG-UNIT tablet Take 1 tablet by mouth daily.   Cholecalciferol 4000 units Caps Take 1 capsule by mouth daily.   citalopram 10 MG tablet Commonly known as:  CELEXA Take 1 tablet (10 mg total) by mouth every other day.   docusate sodium 100 MG capsule Commonly known as:  COLACE Take 1 capsule (100 mg total) by mouth 2 (two) times daily.   donepezil 5 MG tablet Commonly known as:  ARICEPT Take 1 tablet by mouth daily.   FISH OIL PO Take 1 tablet by mouth daily.   levothyroxine 75 MCG tablet Commonly known as:  SYNTHROID, LEVOTHROID Take 1 tablet by mouth daily before breakfast.   Lifitegrast 5 % Soln Place 1 drop into both eyes 2 (two) times daily.   methocarbamol 500 MG tablet Commonly known as:  ROBAXIN Take 1 tablet (500 mg total) by mouth every 6 (six) hours as needed for muscle spasms.   mirabegron ER 25 MG Tb24 tablet Commonly known as:  MYRBETRIQ Take 1 tablet (25 mg total) by mouth daily.   multivitamin tablet Take 1 tablet by mouth daily.   ondansetron 4 MG tablet Commonly known as:  ZOFRAN Take 4 mg by mouth every 6 (six) hours as needed for nausea or vomiting.   sennosides-docusate sodium 8.6-50 MG tablet Commonly known as:  SENOKOT-S Take 1 tablet by mouth 2 (two) times daily.   traMADol 50 MG tablet Commonly known as:  ULTRAM Take 50 mg by mouth every 4 (four) hours as needed.   traMADol 50 MG tablet Commonly known as:  ULTRAM Take 50 mg by mouth 4 (four) times daily. Scheduled for pain. Ok to give prn doses in addition to scheduled for severe pain       Review of Systems  Constitutional: Negative for activity change, appetite change, chills, diaphoresis and  fever.  HENT: Negative for congestion, sneezing, sore throat, trouble swallowing and voice change.   Respiratory: Negative for apnea, cough, choking, chest tightness, shortness of breath and wheezing.   Cardiovascular: Negative for chest pain, palpitations and leg swelling.  Gastrointestinal: Negative for abdominal distention, abdominal pain, constipation, diarrhea and nausea.  Genitourinary: Negative for difficulty urinating, dysuria, frequency and urgency.  Musculoskeletal: Positive for arthralgias (typical arthritis), back pain, gait problem, joint swelling and myalgias.  Skin: Negative for color change, pallor, rash and wound.  Neurological: Positive for weakness. Negative for dizziness, tremors, syncope, speech difficulty, numbness and headaches.  Psychiatric/Behavioral: Positive for confusion and dysphoric mood. Negative for agitation and behavioral problems. The patient is not nervous/anxious.   All other systems reviewed and  are negative.   Immunization History  Administered Date(s) Administered  . Influenza-Unspecified 12/18/2011, 12/18/2012, 11/24/2015  . PPD Test 07/09/2016  . Pneumococcal Conjugate-13 07/21/2015  . Pneumococcal Polysaccharide-23 01/11/2003, 06/03/2008, 07/13/2013  . Td 05/29/2008  . Varicella 07/21/2015   Pertinent  Health Maintenance Due  Topic Date Due  . DEXA SCAN  11/23/1997  . INFLUENZA VACCINE  10/02/2016  . PNA vac Low Risk Adult  Completed   No flowsheet data found. Functional Status Survey:    Vitals:   10/11/16 1029  BP: 137/62  Pulse: 75  Resp: 20  Temp: 97.7 F (36.5 C)  SpO2: 98%  Weight: 106 lb 12.8 oz (48.4 kg)  Height: 5' (1.524 m)   Body mass index is 20.86 kg/m. Physical Exam  Constitutional: She is oriented to person, place, and time. Vital signs are normal. She appears well-developed and well-nourished. She appears lethargic. She is active and cooperative. She appears ill. No distress.  HENT:  Head: Normocephalic and  atraumatic.  Mouth/Throat: Uvula is midline, oropharynx is clear and moist and mucous membranes are normal. Mucous membranes are not pale, not dry and not cyanotic.  Eyes: Pupils are equal, round, and reactive to light. Conjunctivae, EOM and lids are normal.  Neck: Trachea normal, normal range of motion and full passive range of motion without pain. Neck supple. No JVD present. No tracheal deviation, no edema and no erythema present. No thyromegaly present.  Cardiovascular: Normal rate, regular rhythm, normal heart sounds and intact distal pulses.  Exam reveals no gallop, no distant heart sounds and no friction rub.   No murmur heard. Pulses:      Dorsalis pedis pulses are 1+ on the right side, and 1+ on the left side.  Minimal edema in BLE/ ankles  Pulmonary/Chest: Effort normal and breath sounds normal. No accessory muscle usage. No respiratory distress. She has no decreased breath sounds. She has no wheezes. She has no rhonchi. She has no rales. She exhibits no tenderness.  Abdominal: Soft. Normal appearance. She exhibits no distension and no ascites. Bowel sounds are decreased. There is no tenderness.  Musculoskeletal: She exhibits no edema.       Right hip: She exhibits decreased range of motion, decreased strength, tenderness, swelling and laceration.       Right knee: She exhibits decreased range of motion and swelling. Tenderness found.       Cervical back: She exhibits pain.  Expected osteoarthritis, stiffness; calves soft, supple. Negative Homan's sign  Neurological: She is oriented to person, place, and time. She has normal strength. She appears lethargic.  Skin: Skin is warm and dry. Laceration (right hip- s/p ORIF) noted. No rash noted. She is not diaphoretic. No cyanosis or erythema. No pallor. Nails show no clubbing.  Psychiatric: Her speech is normal and behavior is normal. Judgment and thought content normal. Her mood appears anxious. Cognition and memory are impaired. She exhibits  a depressed mood. She exhibits abnormal recent memory.  Nursing note and vitals reviewed.   Labs reviewed:  Recent Labs  09/12/16 0346 09/25/16 1650 10/06/16 1434  NA 136 135 135  K 4.3 4.3 4.0  CL 105 100* 101  CO2 24 26 29   GLUCOSE 112* 86 111*  BUN 25* 31* 24*  CREATININE 1.25* 1.28* 1.13*  CALCIUM 8.4* 9.3 9.6    Recent Labs  06/14/16 1148 07/04/16 1705 09/25/16 1650  AST 30 30 40  ALT 23 21 26   ALKPHOS 64 61 93  BILITOT 0.5 0.6 0.7  PROT 6.6 6.5 6.5  ALBUMIN 4.2 3.9 3.6    Recent Labs  07/04/16 1705  09/10/16 0715  09/13/16 0320 09/25/16 1650 10/06/16 1434  WBC 7.1  < > 11.2*  < > 11.1* 10.8 7.9  NEUTROABS 5.2  --  9.2*  --   --  6.9*  --   HGB 11.1*  < > 10.8*  < > 8.2* 9.8* 10.2*  HCT 33.4*  < > 32.6*  < > 24.1* 29.5* 29.9*  MCV 95.1  < > 94.0  < > 93.1 94.7 95.1  PLT 226  < > 254  < > 173 548* 306  < > = values in this interval not displayed. Lab Results  Component Value Date   TSH 3.958 07/04/2016   No results found for: HGBA1C No results found for: CHOL, HDL, LDLCALC, LDLDIRECT, TRIG, CHOLHDL  Significant Diagnostic Results in last 30 days:  Dg Lumbar Spine 2-3 Views  Result Date: 10/06/2016 CLINICAL DATA:  Pt fell 5 days ago and has since had lower back and RT hip pain; mobile x-rays at nursing home implied presence of new fx in RT hip; pt had surgery on RT hip 09-10-16 EXAM: LUMBAR SPINE - 2-3 VIEW COMPARISON:  MRI lumbar spine 03/01/2010 FINDINGS: No acute loss of vertebral body height. There is sigmoid scoliosis lumbar spine. There is multiple levels of joint space narrowing and endplate spurring rest from MRI of 2011. No subluxation. IMPRESSION: 1. No acute findings lumbar spine. 2. Multilevel disc osteophytic disease related to osteoarthritis and scoliosis. Electronically Signed   By: Genevive Bi M.D.   On: 10/06/2016 15:15   Dg Hip Unilat W Or Wo Pelvis 2-3 Views Right  Result Date: 10/06/2016 CLINICAL DATA:  Low back pain and RIGHT hip  pain. EXAM: DG HIP (WITH OR WITHOUT PELVIS) 2-3V RIGHT COMPARISON:  09/10/2016 FINDINGS: Intramedullary nail fixation of intratrochanteric fracture of the RIGHT femoral neck. Distal interlocking screw noted. Avulsion of the lesser trochanter noted. No complicating features. IMPRESSION: Intramedullary nail fixation of RIGHT intertrochanteric fracture without complication. Electronically Signed   By: Genevive Bi M.D.   On: 10/06/2016 15:12    Assessment/Plan 1. Pain, acute d/t trauma  Xrays negative  Continue scheduled APAP 650 mg po QID  Continue Methocarbamol 500 mg po Q 6 hours prn  Continue Tramadol 50 mg po Q 4 hours prn breakthrough pain  Add Tramadol 50 mg po QID scheduled for pain  2.Closed fracture of right hip with routine healing, subsequent encounter  Continue PT/OT  Ice to the hip prn for pain, edema  Schedule Tylenol 650 mg po QID for pain  Tramadol 50 mg po Q 6 hours prn severe pain  Add Tramadol 50 mg po QID scheduled for pain  Continue safety precautions and fall precautions  Obtain UA, C&S  Family/ staff Communication:   Total Time:  Documentation:  Face to Face:  Family/Phone:   Labs/tests ordered:  UA, C&S  Medication list reviewed and assessed for continued appropriateness. Monthly medication orders reviewed and signed.  Brynda Rim, NP-C [facility] [phone] [address], [place] [address] Cell Phone (Mon-Fri 8am-5pm):  [phone] On Call:  [phone] & follow prompts after 5pm & weekends Office Phone:  [phone] Office Fax:  [phone]

## 2016-10-14 ENCOUNTER — Other Ambulatory Visit: Payer: Self-pay

## 2016-10-14 MED ORDER — TRAMADOL HCL 50 MG PO TABS: 50.0000 mg | ORAL_TABLET | ORAL | 1 refills | Status: AC | PRN

## 2016-10-14 MED ORDER — TRAMADOL HCL 50 MG PO TABS: 50.0000 mg | ORAL_TABLET | Freq: Four times a day (QID) | ORAL | 1 refills | Status: AC

## 2016-10-14 NOTE — Telephone Encounter (Signed)
Rx sent to [facility] phone : [phone] , fax : [phone]  

## 2016-10-17 ENCOUNTER — Non-Acute Institutional Stay (SKILLED_NURSING_FACILITY): Payer: Medicare HMO | Admitting: Gerontology

## 2016-10-17 ENCOUNTER — Encounter: Payer: Self-pay | Admitting: Gerontology

## 2016-10-17 DIAGNOSIS — S72001D Fracture of unspecified part of neck of right femur, subsequent encounter for closed fracture with routine healing: Secondary | ICD-10-CM

## 2016-10-17 DIAGNOSIS — S72024D Nondisplaced fracture of epiphysis (separation) (upper) of right femur, subsequent encounter for closed fracture with routine healing: Secondary | ICD-10-CM | POA: Diagnosis not present

## 2016-10-17 DIAGNOSIS — G8911 Acute pain due to trauma: Secondary | ICD-10-CM | POA: Diagnosis not present

## 2016-10-17 DIAGNOSIS — G9340 Encephalopathy, unspecified: Secondary | ICD-10-CM | POA: Diagnosis not present

## 2016-10-17 NOTE — Progress Notes (Signed)
Location:   [facility] Room Number: 206 A Place of Service:  SNF [phone]) Provider:  Lorenso Quarry, NP-C  Carrie Ferrier, MD  Patient Care Team: Carrie Ferrier, MD as PCP - General (Internal Medicine)  Extended Emergency Contact Information Primary Emergency Contact: Combs,Carol [place] [address] [place] Home Phone: [phone] Relation: Daughter Secondary Emergency Contact: Delma Free[place] Home Phone: [phone] Relation: Son  Code Status:  FULL Goals of care: Advanced Directive information Advanced Directives 10/17/2016  Does Patient Have a Medical Advance Directive? No  Type of Advance Directive -  Does patient want to make changes to medical advance directive? -  Copy of Healthcare Power of Attorney in Chart? -  Would patient like information on creating a medical advance directive? -     Chief Complaint  Patient presents with  . Medical Management of Chronic Issues    Routine Visit    HPI:  Pt is a 83 y.o. female seen today for follow up. Pt was admitted to the facility for rehab following hospitalization for Right hip fracture with subsequent ORIF resulting from a mechanical fall. Pt has had issues with pain control and confusion- likely related to severe pain. Pt has been participating in PT and OT. She has had multiple xrays of the right hip d/t fall in rehab and then worsening pain. Xrays have been negative. Multiple medication changes have been made to manage pain. Pt seems to be more comfortable at this time. Pt overall appears more alert and more comfortable. Pt reports she is feeling much better than she was last week. She denies excessive pain in the hip or knee. Denies n/v/d/f/c/cp/sob/haabd pain/dizziness/cough. She reports she is eating better. Otherwise, exam unremarkable. VSS.       Past Medical History:  Diagnosis Date  . Anemia, unspecified   . Anxiety   . Atrioventricular block  06/09/2008   1st degree with bradycardia  . Cardiac arrest (HCC)   . Chicken pox   . CKD (chronic kidney disease) stage 3, GFR 30-59 ml/min   . Diverticulosis   . History of cardiac arrest    during prior cataract surgery, thought to be from anesthesia, per pt. Followed by Dr. Darrold Junker  . Hyperlipidemia, unspecified   . Hypertension   . Hypothyroidism, unspecified   . Osteopenia   . Renal disorder    Past Surgical History:  Procedure Laterality Date  . APPENDECTOMY  1970  . CATARACT EXTRACTION, BILATERAL    . COLONOSCOPY  04/29/2006  . FOOT SURGERY Bilateral   . INTRAMEDULLARY (IM) NAIL INTERTROCHANTERIC Right 09/10/2016   Procedure: INTRAMEDULLARY (IM) NAIL INTERTROCHANTRIC;  Surgeon: [name], MD;  Location: [facility] ORS;  Service: Orthopedics;  Laterality: Right;  . TUBAL LIGATION Bilateral 1970    Allergies  Allergen Reactions  . Alendronate     unkown  . Lidocaine     Cardiac Arrest  . Phenylephrine   . Phenyltoloxamine-Acetaminophen   . Azithromycin Rash    Allergies as of 10/17/2016      Reactions   Alendronate    unkown   Lidocaine    Cardiac Arrest   Phenylephrine    Phenyltoloxamine-acetaminophen    Azithromycin Rash      Medication List       Accurate as of 10/17/16  9:27 AM. Always use your most recent med list.          acetaminophen 325 MG tablet Commonly known as:  TYLENOL Take 650 mg by mouth 4 (four) times daily.   acetaminophen 325 MG tablet Commonly known as:  TYLENOL Take 2 tablets (650 mg total) by mouth every 6 (six) hours as needed for mild pain (or Fever >/= 101).   ASPERCREME LIDOCAINE 4 % Ptch Generic drug:  Lidocaine Apply 1 patch topically daily. Apply to area of pain usually either the inner groin or the right knee. If patient wants, may cut patch in half and use a piece on each site. Remove after 12 hours.   aspirin EC 81 MG tablet Take 1 tablet by mouth daily.   bisacodyl 10 MG suppository Commonly known as:   DULCOLAX Place 10 mg rectally daily as needed for moderate constipation.   busPIRone 15 MG tablet Commonly known as:  BUSPAR Take 1 tablet by mouth daily.   Calcium Carbonate-Vitamin D 600-400 MG-UNIT tablet Take 1 tablet by mouth daily.   Cholecalciferol 4000 units Caps Take 1 capsule by mouth daily.   citalopram 10 MG tablet Commonly known as:  CELEXA Take 1 tablet (10 mg total) by mouth every other day.   docusate sodium 100 MG capsule Commonly known as:  COLACE Take 1 capsule (100 mg total) by mouth 2 (two) times daily.   donepezil 5 MG tablet Commonly known as:  ARICEPT Take 1 tablet by mouth daily.   FISH OIL PO Take 1 tablet by mouth daily.   levothyroxine 75 MCG tablet Commonly known as:  SYNTHROID, LEVOTHROID Take 1 tablet by mouth daily before breakfast.   Lifitegrast 5 % Soln Place 1 drop into both eyes 2 (two) times daily.   methocarbamol 500 MG tablet Commonly known as:  ROBAXIN Take 1 tablet (500 mg total) by mouth every 6 (six) hours as needed for muscle spasms.   mirabegron ER 25 MG Tb24 tablet Commonly known as:  MYRBETRIQ Take 1 tablet (25 mg total) by mouth daily.   multivitamin tablet Take 1 tablet by mouth daily.   sennosides-docusate sodium 8.6-50 MG tablet Commonly known as:  SENOKOT-S Take 1 tablet by mouth 2 (two) times daily.   traMADol 50 MG tablet Commonly known as:  ULTRAM Take 1 tablet (50 mg total) by mouth every 4 (four) hours as needed.   traMADol 50 MG tablet Commonly known as:  ULTRAM Take 1 tablet (50 mg total) by mouth 4 (four) times daily. Scheduled for pain. Ok to give prn doses in addition to scheduled for severe pain       Review of Systems  Constitutional: Negative for activity change, appetite change, chills, diaphoresis and fever.  HENT: Negative for congestion, sneezing, sore throat, trouble swallowing and voice change.   Respiratory: Negative for apnea, cough, choking, chest tightness, shortness of breath  and wheezing.   Cardiovascular: Negative for chest pain, palpitations and leg swelling.  Gastrointestinal: Negative for abdominal distention, abdominal pain, constipation, diarrhea and nausea.  Genitourinary: Negative for difficulty urinating, dysuria, frequency and urgency.  Musculoskeletal: Positive for arthralgias (typical arthritis), back pain, gait problem, joint swelling and myalgias.  Skin: Negative for color change, pallor, rash and wound.  Neurological: Positive for weakness. Negative for dizziness, tremors, syncope, speech difficulty, numbness and headaches.  Psychiatric/Behavioral: Negative for agitation, behavioral problems, confusion and dysphoric mood. The patient is not nervous/anxious.   All other systems reviewed and are negative.   Immunization History  Administered Date(s) Administered  . Influenza-Unspecified 12/18/2011, 12/18/2012, 11/24/2015  . PPD Test 07/09/2016  . Pneumococcal Conjugate-13 07/21/2015  . Pneumococcal Polysaccharide-23 01/11/2003,  06/03/2008, 07/13/2013  . Td 05/29/2008  . Varicella 07/21/2015   Pertinent  Health Maintenance Due  Topic Date Due  . DEXA SCAN  11/23/1997  . INFLUENZA VACCINE  10/02/2016  . PNA vac Low Risk Adult  Completed   No flowsheet data found. Functional Status Survey:    Vitals:   10/17/16 0918  BP: (!) 149/93  Pulse: 73  Resp: 16  Temp: 97.9 F (36.6 C)  SpO2: 99%  Weight: 103 lb 11.2 oz (47 kg)  Height: 5' (1.524 m)   Body mass index is 20.25 kg/m. Physical Exam  Constitutional: She is oriented to person, place, and time. Vital signs are normal. She appears well-developed and well-nourished. She appears lethargic. She is active and cooperative. She appears ill. No distress.  HENT:  Head: Normocephalic and atraumatic.  Mouth/Throat: Uvula is midline, oropharynx is clear and moist and mucous membranes are normal. Mucous membranes are not pale, not dry and not cyanotic.  Eyes: Pupils are equal, round, and  reactive to light. Conjunctivae, EOM and lids are normal.  Neck: Trachea normal, normal range of motion and full passive range of motion without pain. Neck supple. No JVD present. No tracheal deviation, no edema and no erythema present. No thyromegaly present.  Cardiovascular: Normal rate, regular rhythm, normal heart sounds and intact distal pulses.  Exam reveals no gallop, no distant heart sounds and no friction rub.   No murmur heard. Pulses:      Dorsalis pedis pulses are 1+ on the right side, and 1+ on the left side.  Minimal edema in BLE/ ankles  Pulmonary/Chest: Effort normal and breath sounds normal. No accessory muscle usage. No respiratory distress. She has no decreased breath sounds. She has no wheezes. She has no rhonchi. She has no rales. She exhibits no tenderness.  Abdominal: Soft. Normal appearance. She exhibits no distension and no ascites. Bowel sounds are decreased. There is no tenderness.  Musculoskeletal: She exhibits no edema.       Right hip: She exhibits decreased range of motion, decreased strength, tenderness, swelling and laceration.       Right knee: She exhibits decreased range of motion and swelling. Tenderness found.       Cervical back: She exhibits pain.  Expected osteoarthritis, stiffness; calves soft, supple. Negative Homan's sign  Neurological: She is oriented to person, place, and time. She has normal strength. She appears lethargic.  Skin: Skin is warm and dry. Laceration (right hip- s/p ORIF) noted. No rash noted. She is not diaphoretic. No cyanosis or erythema. No pallor. Nails show no clubbing.  Psychiatric: Her speech is normal and behavior is normal. Judgment and thought content normal. Her mood appears anxious. Cognition and memory are impaired. She exhibits a depressed mood. She exhibits abnormal recent memory.  Nursing note and vitals reviewed.   Labs reviewed:  Recent Labs  09/12/16 0346 09/25/16 1650 10/06/16 1434  NA 136 135 135  K 4.3 4.3  4.0  CL 105 100* 101  CO2 24 26 29   GLUCOSE 112* 86 111*  BUN 25* 31* 24*  CREATININE 1.25* 1.28* 1.13*  CALCIUM 8.4* 9.3 9.6    Recent Labs  06/14/16 1148 07/04/16 1705 09/25/16 1650  AST 30 30 40  ALT 23 21 26   ALKPHOS 64 61 93  BILITOT 0.5 0.6 0.7  PROT 6.6 6.5 6.5  ALBUMIN 4.2 3.9 3.6    Recent Labs  07/04/16 1705  09/10/16 0715  09/13/16 0320 09/25/16 1650 10/06/16 1434  WBC 7.1  < >  11.2*  < > 11.1* 10.8 7.9  NEUTROABS 5.2  --  9.2*  --   --  6.9*  --   HGB 11.1*  < > 10.8*  < > 8.2* 9.8* 10.2*  HCT 33.4*  < > 32.6*  < > 24.1* 29.5* 29.9*  MCV 95.1  < > 94.0  < > 93.1 94.7 95.1  PLT 226  < > 254  < > 173 548* 306  < > = values in this interval not displayed. Lab Results  Component Value Date   TSH 3.958 07/04/2016   No results found for: HGBA1C No results found for: CHOL, HDL, LDLCALC, LDLDIRECT, TRIG, CHOLHDL  Significant Diagnostic Results in last 30 days:  Dg Lumbar Spine 2-3 Views  Result Date: 10/06/2016 CLINICAL DATA:  Pt fell 5 days ago and has since had lower back and RT hip pain; mobile x-rays at nursing home implied presence of new fx in RT hip; pt had surgery on RT hip 09-10-16 EXAM: LUMBAR SPINE - 2-3 VIEW COMPARISON:  MRI lumbar spine 03/01/2010 FINDINGS: No acute loss of vertebral body height. There is sigmoid scoliosis lumbar spine. There is multiple levels of joint space narrowing and endplate spurring rest from MRI of 2011. No subluxation. IMPRESSION: 1. No acute findings lumbar spine. 2. Multilevel disc osteophytic disease related to osteoarthritis and scoliosis. Electronically Signed   By: Genevive Bi M.D.   On: 10/06/2016 15:15   Dg Hip Unilat W Or Wo Pelvis 2-3 Views Right  Result Date: 10/06/2016 CLINICAL DATA:  Low back pain and RIGHT hip pain. EXAM: DG HIP (WITH OR WITHOUT PELVIS) 2-3V RIGHT COMPARISON:  09/10/2016 FINDINGS: Intramedullary nail fixation of intratrochanteric fracture of the RIGHT femoral neck. Distal interlocking screw  noted. Avulsion of the lesser trochanter noted. No complicating features. IMPRESSION: Intramedullary nail fixation of RIGHT intertrochanteric fracture without complication. Electronically Signed   By: Genevive Bi M.D.   On: 10/06/2016 15:12    Assessment/Plan 1. Pain, acute d/t trauma  Xrays negative  Continue scheduled APAP 650 mg po QID  Continue Methocarbamol 500 mg po Q 6 hours prn  Continue Tramadol 50 mg po Q 4 hours prn breakthrough pain  Add Tramadol 50 mg po QID scheduled for pain  2.Closed fracture of right hip with routine healing, subsequent encounter  Continue PT/OT  Ice to the hip prn for pain, edema  Schedule Tylenol 650 mg po QID for pain  Tramadol 50 mg po Q 6 hours prn severe pain  Add Tramadol 50 mg po QID scheduled for pain  Continue safety precautions and fall precautions  3. Encephalopathy  Stable  Resolved   Continue to monitor pain control  Encourage po fluid intake   Family/ staff Communication:   Total Time:  Documentation:  Face to Face:  Family/Phone:   Labs/tests ordered:   Medication list reviewed and assessed for continued appropriateness. Monthly medication orders reviewed and signed.  Brynda Rim, NP-C [facility]iatric[facility] [phone] [address] [place], [place] [address] Cell Phone (Mon-Fri 8am-5pm):  [phone] On Call:  [phone] & follow prompts after 5pm & weekends Office Phone:  [phone] Office Fax:  [phone]

## 2016-10-24 DIAGNOSIS — D631 Anemia in chronic kidney disease: Secondary | ICD-10-CM | POA: Diagnosis not present

## 2016-10-24 DIAGNOSIS — F0390 Unspecified dementia without behavioral disturbance: Secondary | ICD-10-CM | POA: Diagnosis not present

## 2016-10-24 DIAGNOSIS — M80051D Age-related osteoporosis with current pathological fracture, right femur, subsequent encounter for fracture with routine healing: Secondary | ICD-10-CM | POA: Diagnosis not present

## 2016-10-24 DIAGNOSIS — N183 Chronic kidney disease, stage 3 (moderate): Secondary | ICD-10-CM | POA: Diagnosis not present

## 2016-10-24 DIAGNOSIS — I129 Hypertensive chronic kidney disease with stage 1 through stage 4 chronic kidney disease, or unspecified chronic kidney disease: Secondary | ICD-10-CM | POA: Diagnosis not present

## 2016-10-24 DIAGNOSIS — M5136 Other intervertebral disc degeneration, lumbar region: Secondary | ICD-10-CM | POA: Diagnosis not present

## 2016-10-25 DIAGNOSIS — D631 Anemia in chronic kidney disease: Secondary | ICD-10-CM | POA: Diagnosis not present

## 2016-10-25 DIAGNOSIS — M80051D Age-related osteoporosis with current pathological fracture, right femur, subsequent encounter for fracture with routine healing: Secondary | ICD-10-CM | POA: Diagnosis not present

## 2016-10-25 DIAGNOSIS — F0390 Unspecified dementia without behavioral disturbance: Secondary | ICD-10-CM | POA: Diagnosis not present

## 2016-10-25 DIAGNOSIS — I129 Hypertensive chronic kidney disease with stage 1 through stage 4 chronic kidney disease, or unspecified chronic kidney disease: Secondary | ICD-10-CM | POA: Diagnosis not present

## 2016-10-25 DIAGNOSIS — G4700 Insomnia, unspecified: Secondary | ICD-10-CM | POA: Diagnosis not present

## 2016-10-25 DIAGNOSIS — M5136 Other intervertebral disc degeneration, lumbar region: Secondary | ICD-10-CM | POA: Diagnosis not present

## 2016-10-25 DIAGNOSIS — N183 Chronic kidney disease, stage 3 (moderate): Secondary | ICD-10-CM | POA: Diagnosis not present

## 2016-10-28 DIAGNOSIS — I129 Hypertensive chronic kidney disease with stage 1 through stage 4 chronic kidney disease, or unspecified chronic kidney disease: Secondary | ICD-10-CM | POA: Diagnosis not present

## 2016-10-28 DIAGNOSIS — M5136 Other intervertebral disc degeneration, lumbar region: Secondary | ICD-10-CM | POA: Diagnosis not present

## 2016-10-28 DIAGNOSIS — F0390 Unspecified dementia without behavioral disturbance: Secondary | ICD-10-CM | POA: Diagnosis not present

## 2016-10-28 DIAGNOSIS — N183 Chronic kidney disease, stage 3 (moderate): Secondary | ICD-10-CM | POA: Diagnosis not present

## 2016-10-28 DIAGNOSIS — D631 Anemia in chronic kidney disease: Secondary | ICD-10-CM | POA: Diagnosis not present

## 2016-10-28 DIAGNOSIS — M80051D Age-related osteoporosis with current pathological fracture, right femur, subsequent encounter for fracture with routine healing: Secondary | ICD-10-CM | POA: Diagnosis not present

## 2016-10-29 DIAGNOSIS — N183 Chronic kidney disease, stage 3 (moderate): Secondary | ICD-10-CM | POA: Diagnosis not present

## 2016-10-29 DIAGNOSIS — D631 Anemia in chronic kidney disease: Secondary | ICD-10-CM | POA: Diagnosis not present

## 2016-10-29 DIAGNOSIS — M5136 Other intervertebral disc degeneration, lumbar region: Secondary | ICD-10-CM | POA: Diagnosis not present

## 2016-10-29 DIAGNOSIS — I129 Hypertensive chronic kidney disease with stage 1 through stage 4 chronic kidney disease, or unspecified chronic kidney disease: Secondary | ICD-10-CM | POA: Diagnosis not present

## 2016-10-29 DIAGNOSIS — F0390 Unspecified dementia without behavioral disturbance: Secondary | ICD-10-CM | POA: Diagnosis not present

## 2016-10-29 DIAGNOSIS — M80051D Age-related osteoporosis with current pathological fracture, right femur, subsequent encounter for fracture with routine healing: Secondary | ICD-10-CM | POA: Diagnosis not present

## 2016-10-30 DIAGNOSIS — M5136 Other intervertebral disc degeneration, lumbar region: Secondary | ICD-10-CM | POA: Diagnosis not present

## 2016-10-30 DIAGNOSIS — M80051D Age-related osteoporosis with current pathological fracture, right femur, subsequent encounter for fracture with routine healing: Secondary | ICD-10-CM | POA: Diagnosis not present

## 2016-10-30 DIAGNOSIS — F0390 Unspecified dementia without behavioral disturbance: Secondary | ICD-10-CM | POA: Diagnosis not present

## 2016-10-30 DIAGNOSIS — I129 Hypertensive chronic kidney disease with stage 1 through stage 4 chronic kidney disease, or unspecified chronic kidney disease: Secondary | ICD-10-CM | POA: Diagnosis not present

## 2016-10-30 DIAGNOSIS — D631 Anemia in chronic kidney disease: Secondary | ICD-10-CM | POA: Diagnosis not present

## 2016-10-30 DIAGNOSIS — N183 Chronic kidney disease, stage 3 (moderate): Secondary | ICD-10-CM | POA: Diagnosis not present

## 2016-10-31 DIAGNOSIS — I129 Hypertensive chronic kidney disease with stage 1 through stage 4 chronic kidney disease, or unspecified chronic kidney disease: Secondary | ICD-10-CM | POA: Diagnosis not present

## 2016-10-31 DIAGNOSIS — D631 Anemia in chronic kidney disease: Secondary | ICD-10-CM | POA: Diagnosis not present

## 2016-10-31 DIAGNOSIS — N183 Chronic kidney disease, stage 3 (moderate): Secondary | ICD-10-CM | POA: Diagnosis not present

## 2016-10-31 DIAGNOSIS — M80051D Age-related osteoporosis with current pathological fracture, right femur, subsequent encounter for fracture with routine healing: Secondary | ICD-10-CM | POA: Diagnosis not present

## 2016-10-31 DIAGNOSIS — M5136 Other intervertebral disc degeneration, lumbar region: Secondary | ICD-10-CM | POA: Diagnosis not present

## 2016-10-31 DIAGNOSIS — F0390 Unspecified dementia without behavioral disturbance: Secondary | ICD-10-CM | POA: Diagnosis not present

## 2016-11-01 DIAGNOSIS — N183 Chronic kidney disease, stage 3 (moderate): Secondary | ICD-10-CM | POA: Diagnosis not present

## 2016-11-01 DIAGNOSIS — M5136 Other intervertebral disc degeneration, lumbar region: Secondary | ICD-10-CM | POA: Diagnosis not present

## 2016-11-01 DIAGNOSIS — I129 Hypertensive chronic kidney disease with stage 1 through stage 4 chronic kidney disease, or unspecified chronic kidney disease: Secondary | ICD-10-CM | POA: Diagnosis not present

## 2016-11-01 DIAGNOSIS — M80051D Age-related osteoporosis with current pathological fracture, right femur, subsequent encounter for fracture with routine healing: Secondary | ICD-10-CM | POA: Diagnosis not present

## 2016-11-01 DIAGNOSIS — F0390 Unspecified dementia without behavioral disturbance: Secondary | ICD-10-CM | POA: Diagnosis not present

## 2016-11-01 DIAGNOSIS — D631 Anemia in chronic kidney disease: Secondary | ICD-10-CM | POA: Diagnosis not present

## 2016-11-05 ENCOUNTER — Emergency Department
Admission: EM | Admit: 2016-11-05 | Discharge: 2016-11-05 | Disposition: A | Discharge status: Home / Self Care | Payer: Medicare HMO | Attending: Emergency Medicine | Admitting: Emergency Medicine

## 2016-11-05 DIAGNOSIS — N183 Chronic kidney disease, stage 3 (moderate): Secondary | ICD-10-CM | POA: Diagnosis not present

## 2016-11-05 DIAGNOSIS — I129 Hypertensive chronic kidney disease with stage 1 through stage 4 chronic kidney disease, or unspecified chronic kidney disease: Secondary | ICD-10-CM | POA: Diagnosis not present

## 2016-11-05 DIAGNOSIS — M62838 Other muscle spasm: Secondary | ICD-10-CM | POA: Diagnosis not present

## 2016-11-05 DIAGNOSIS — Z79899 Other long term (current) drug therapy: Secondary | ICD-10-CM | POA: Insufficient documentation

## 2016-11-05 DIAGNOSIS — M5136 Other intervertebral disc degeneration, lumbar region: Secondary | ICD-10-CM | POA: Diagnosis not present

## 2016-11-05 DIAGNOSIS — D631 Anemia in chronic kidney disease: Secondary | ICD-10-CM | POA: Diagnosis not present

## 2016-11-05 DIAGNOSIS — M80051D Age-related osteoporosis with current pathological fracture, right femur, subsequent encounter for fracture with routine healing: Secondary | ICD-10-CM | POA: Diagnosis not present

## 2016-11-05 DIAGNOSIS — F0390 Unspecified dementia without behavioral disturbance: Secondary | ICD-10-CM | POA: Diagnosis not present

## 2016-11-05 DIAGNOSIS — E039 Hypothyroidism, unspecified: Secondary | ICD-10-CM | POA: Diagnosis not present

## 2016-11-05 DIAGNOSIS — Z7982 Long term (current) use of aspirin: Secondary | ICD-10-CM | POA: Diagnosis not present

## 2016-11-05 DIAGNOSIS — R251 Tremor, unspecified: Secondary | ICD-10-CM | POA: Diagnosis present

## 2016-11-05 LAB — COMPREHENSIVE METABOLIC PANEL
ALK PHOS: 82 U/L (ref 38–126)
ALT: 14 U/L (ref 14–54)
AST: 22 U/L (ref 15–41)
Albumin: 3.7 g/dL (ref 3.5–5.0)
Anion gap: 7 (ref 5–15)
BILIRUBIN TOTAL: 0.4 mg/dL (ref 0.3–1.2)
BUN: 23 mg/dL — AB (ref 6–20)
CALCIUM: 9.2 mg/dL (ref 8.9–10.3)
CHLORIDE: 105 mmol/L (ref 101–111)
CO2: 28 mmol/L (ref 22–32)
CREATININE: 1.1 mg/dL — AB (ref 0.44–1.00)
GFR, EST AFRICAN AMERICAN: 52 mL/min — AB (ref >60)
GFR, EST NON AFRICAN AMERICAN: 45 mL/min — AB (ref >60)
Glucose, Bld: 89 mg/dL (ref 65–99)
Potassium: 4.3 mmol/L (ref 3.5–5.1)
Sodium: 140 mmol/L (ref 135–145)
Total Protein: 6 g/dL — ABNORMAL LOW (ref 6.5–8.1)

## 2016-11-05 LAB — CBC
HEMATOCRIT: 32.7 % — AB (ref 35.0–47.0)
HEMOGLOBIN: 10.9 g/dL — AB (ref 12.0–16.0)
MCH: 31.8 pg (ref 26.0–34.0)
MCHC: 33.3 g/dL (ref 32.0–36.0)
MCV: 95.5 fL (ref 80.0–100.0)
PLATELETS: 243 10*3/uL (ref 150–440)
RBC: 3.42 MIL/uL — AB (ref 3.80–5.20)
RDW: 14.9 % — ABNORMAL HIGH (ref 11.5–14.5)
WBC: 5.8 10*3/uL (ref 3.6–11.0)

## 2016-11-05 MED ORDER — SODIUM CHLORIDE 0.9 % IV SOLN
1000.0000 mL | Freq: Once | INTRAVENOUS | Status: AC
  Administered 2016-11-05: 1000 mL via INTRAVENOUS

## 2016-11-05 NOTE — ED Provider Notes (Signed)
[facility] Emergency Department Provider Note   ____________________________________________    I have reviewed the triage vital signs and the nursing notes.   HISTORY  Chief Complaint Tremors     HPI Carrie Calhoun is a 83 y.o. female who presentsto the ED for evaluation of apparent spasms. Patient reports she was trying to find close to where to breakfast this morning when she apparently had a spasm that "knocked her backwards ". She was caught by her aide who prevented her from falling. Patient and son report this is happened before seems to happen every few weeks but not on a daily basis. Unclear cause of this. Patient feels she may be dehydrated. No new medications reported. No headache or neuro deficits.   Past Medical History:  Diagnosis Date  . Anemia, unspecified   . Anxiety   . Atrioventricular block 06/09/2008   1st degree with bradycardia  . Cardiac arrest (HCC)   . Chicken pox   . CKD (chronic kidney disease) stage 3, GFR 30-59 ml/min   . Diverticulosis   . History of cardiac arrest    during prior cataract surgery, thought to be from anesthesia, per pt. Followed by Dr. Darrold JunkerParaschos  . Hyperlipidemia, unspecified   . Hypertension   . Hypothyroidism, unspecified   . Osteopenia   . Renal disorder     Patient Active Problem List   Diagnosis Date Noted  . DNR (do not resuscitate) 09/12/2016  . Hip fracture (HCC) 09/10/2016  . Osteopenia 09/02/2016  . Hyperlipidemia, unspecified 09/02/2016  . DDD (degenerative disc disease), lumbar 09/02/2016  . CKD (chronic kidney disease), stage III 09/02/2016  . Anemia, unspecified 09/02/2016  . Ataxia 07/22/2016  . Dementia arising in the senium and presenium 07/15/2016  . Multiple rib fractures 07/04/2016  . Moderate major depression, single episode (HCC) 06/14/2016  . Difficulty walking 01/08/2016  . Hypothyroidism due to acquired atrophy of thyroid 07/21/2015  . Paresthesia of both  hands 06/28/2014  . Bilateral carpal tunnel syndrome 06/28/2014  . Tremor 12/28/2013  . Restless legs 12/28/2013  . Loss of memory 12/28/2013  . Essential hypertension 12/28/2013    Past Surgical History:  Procedure Laterality Date  . APPENDECTOMY  1970  . CATARACT EXTRACTION, BILATERAL    . COLONOSCOPY  04/29/2006  . FOOT SURGERY Bilateral   . INTRAMEDULLARY (IM) NAIL INTERTROCHANTERIC Right 09/10/2016   Procedure: INTRAMEDULLARY (IM) NAIL INTERTROCHANTRIC;  Surgeon: Juanell FairlyKrasinski, Kevin, MD;  Location: [facility] ORS;  Service: Orthopedics;  Laterality: Right;  . TUBAL LIGATION Bilateral 1970    Prior to Admission medications   Medication Sig Start Date End Date Taking? Authorizing Provider  acetaminophen (TYLENOL) 325 MG tablet Take 2 tablets (650 mg total) by mouth every 6 (six) hours as needed for mild pain (or Fever >/= 101). 07/08/16   Milagros LollSudini, Srikar, MD  acetaminophen (TYLENOL) 325 MG tablet Take 650 mg by mouth 4 (four) times daily.    [provider]  aspirin EC 81 MG tablet Take 1 tablet by mouth daily.    [provider]  bisacodyl (DULCOLAX) 10 MG suppository Place 10 mg rectally daily as needed for moderate constipation.    [provider]  busPIRone (BUSPAR) 15 MG tablet Take 1 tablet by mouth daily.  05/13/16   [provider]  Calcium Carbonate-Vitamin D 600-400 MG-UNIT tablet Take 1 tablet by mouth daily.     [provider]  Cholecalciferol 4000 units CAPS Take 1 capsule by mouth daily.  [provider]  citalopram (CELEXA) 10 MG tablet Take 1 tablet (10 mg total) by mouth every other day. 07/08/16   Milagros Loll, MD  docusate sodium (COLACE) 100 MG capsule Take 1 capsule (100 mg total) by mouth 2 (two) times daily. 09/13/16   Milagros Loll, MD  donepezil (ARICEPT) 5 MG tablet Take 1 tablet by mouth daily.  05/13/16   [provider]  levothyroxine (SYNTHROID, LEVOTHROID) 75 MCG tablet Take 1 tablet by mouth daily  before breakfast.  03/11/16   [provider]  Lidocaine (ASPERCREME LIDOCAINE) 4 % PTCH Apply 1 patch topically daily. Apply to area of pain usually either the inner groin or the right knee. If patient wants, may cut patch in half and use a piece on each site. Remove after 12 hours.    [provider]  Lifitegrast 5 % SOLN Place 1 drop into both eyes 2 (two) times daily.     [provider]  methocarbamol (ROBAXIN) 500 MG tablet Take 1 tablet (500 mg total) by mouth every 6 (six) hours as needed for muscle spasms. 09/13/16   Milagros Loll, MD  mirabegron ER (MYRBETRIQ) 25 MG TB24 tablet Take 1 tablet (25 mg total) by mouth daily. 09/02/16   Michiel Cowboy A, PA-C  Multiple Vitamin (MULTIVITAMIN) tablet Take 1 tablet by mouth daily.    [provider]  Omega-3 Fatty Acids (FISH OIL PO) Take 1 tablet by mouth daily.    [provider]  sennosides-docusate sodium (SENOKOT-S) 8.6-50 MG tablet Take 1 tablet by mouth 2 (two) times daily.    [provider]  traMADol (ULTRAM) 50 MG tablet Take 1 tablet (50 mg total) by mouth every 4 (four) hours as needed. 10/14/16   Lorenso Quarry, NP  traMADol (ULTRAM) 50 MG tablet Take 1 tablet (50 mg total) by mouth 4 (four) times daily. Scheduled for pain. Ok to give prn doses in addition to scheduled for severe pain 10/14/16   Lorenso Quarry, NP     Allergies Alendronate; Lidocaine; Phenylephrine; Phenyltoloxamine-acetaminophen; and Azithromycin  Family History  Problem Relation Age of Onset  . Hypertension Mother   . Stroke Mother   . Osteoporosis Mother   . Thyroid disease Mother   . Breast cancer Maternal Aunt   . Breast cancer Maternal Aunt     Social History Social History  Substance Use Topics  . Smoking status: Never Smoker  . Smokeless tobacco: Never Used  . Alcohol use No    Review of Systems  Constitutional: No fever/chills Eyes: No visual changes.  ENT: No sore throat. Cardiovascular:  Denies chest pain. Respiratory: Denies shortness of breath. Gastrointestinal: No abdominal pain.  No nausea, no vomiting.   Genitourinary: Negative for dysuria. Musculoskeletal: Negative for back pain. Skin: Negative for rash. Neurological: Negative for headaches    ____________________________________________   PHYSICAL EXAM:  VITAL SIGNS: ED Triage Vitals  Enc Vitals Group     BP 11/05/16 0935 (!) 151/100     Pulse Rate 11/05/16 0935 (!) 57     Resp 11/05/16 0935 17     Temp 11/05/16 0941 97.9 F (36.6 C)     Temp Source 11/05/16 0935 Oral     SpO2 11/05/16 0935 100 %     Weight 11/05/16 0936 46.7 kg (103 lb)     Height 11/05/16 0936 1.524 m (5')     Head Circumference --      Peak Flow --      Pain Score 11/05/16  0935 0     Pain Loc --      Pain Edu? --      Excl. in GC? --     Constitutional: Alert and oriented. No acute distress. Pleasant and interactive Eyes: Conjunctivae are normal.  Head: Atraumatic. Nose: No congestion/rhinnorhea. Mouth/Throat: Mucous membranes are moist.   Neck:  Painless ROM Cardiovascular: Normal rate, regular rhythm. Grossly normal heart sounds.  Good peripheral circulation. Respiratory: Normal respiratory effort.  No retractions. Lungs CTAB. Gastrointestinal: Soft and nontender. No distention.  No CVA tenderness. Genitourinary: deferred Musculoskeletal: No lower extremity tenderness nor edema.  Warm and well perfused Neurologic:  Normal speech and language. No gross focal neurologic deficits are appreciated.  Skin:  Skin is warm, dry and intact. No rash noted. Psychiatric: Mood and affect are normal. Speech and behavior are normal.  ____________________________________________   LABS (all labs ordered are listed, but only abnormal results are displayed)  Labs Reviewed  CBC - Abnormal; Notable for the following:       Result Value   RBC 3.42 (*)    Hemoglobin 10.9 (*)    HCT 32.7 (*)    RDW 14.9 (*)    All other components  within normal limits  COMPREHENSIVE METABOLIC PANEL - Abnormal; Notable for the following:    BUN 23 (*)    Creatinine, Ser 1.10 (*)    Total Protein 6.0 (*)    GFR calc non Af Amer 45 (*)    GFR calc Af Amer 52 (*)    All other components within normal limits   ____________________________________________  EKG  None ____________________________________________  RADIOLOGY  None ____________________________________________   PROCEDURES  Procedure(s) performed: No    Critical Care performed: No ____________________________________________   INITIAL IMPRESSION / ASSESSMENT AND PLAN / ED COURSE  Pertinent labs & imaging results that were available during my care of the patient were reviewed by me and considered in my medical decision making (see chart for details).  Patient had extensive workup for this several months ago including unremarkable MRI. Lab work done today shows mild dehydration, patient was given IV fluids. No further spasms in the emergency department. Recommend follow-up with PCP for further evaluation. Discussed with Dr. Thad Ranger of neurology who agreed with outpatient follow-up    ____________________________________________   FINAL CLINICAL IMPRESSION(S) / ED DIAGNOSES  Final diagnoses:  Muscle spasm      NEW MEDICATIONS STARTED DURING THIS VISIT:  Discharge Medication List as of 11/05/2016  2:15 PM       Note:  This document was prepared using Dragon voice recognition software and may include unintentional dictation errors.    Jene Every, MD 11/05/16 1500

## 2016-11-05 NOTE — ED Notes (Signed)
Called lab per Dr Cyril LoosenKinner to see when results for CMP will be in - they stated 5 min - Dr Cyril LoosenKinner made aware

## 2016-11-05 NOTE — ED Notes (Addendum)
Pt reports that this am she was hanging clothes up ([facility]) and had a "jerking spell" and fell then when pt was walking to bed she had "another episode" - pt states they were here in June and had complete work up and a neurologist work up with no dx - July broke her hip - family reports the "jerking episodes" as violent and reports that pt has no memory of the episode - pt denies headache with episode - denies dizziness - caregiver reports that pt was very sleepy yesterday - family reports that she is confused after episode but returns to baseline within minutes - pt reports that it is [id number] and that she is in a place that helps people but cannot recall the name - oriented to person

## 2016-11-05 NOTE — ED Triage Notes (Signed)
Pt is here with her son and caregiver who states while walking this morning had some violent jerking motion that caused her to fall x1 and nearly 2 other times that the caregiver assisted her, states it only last a couple of seconds.. Pt denies any injures from the fall. Pt does having a healing ecchymosis to the left forehead from where she hit it while bending down and hit a cabinet over the weekend.

## 2016-11-06 DIAGNOSIS — I129 Hypertensive chronic kidney disease with stage 1 through stage 4 chronic kidney disease, or unspecified chronic kidney disease: Secondary | ICD-10-CM | POA: Diagnosis not present

## 2016-11-06 DIAGNOSIS — M80051D Age-related osteoporosis with current pathological fracture, right femur, subsequent encounter for fracture with routine healing: Secondary | ICD-10-CM | POA: Diagnosis not present

## 2016-11-06 DIAGNOSIS — D631 Anemia in chronic kidney disease: Secondary | ICD-10-CM | POA: Diagnosis not present

## 2016-11-06 DIAGNOSIS — F0390 Unspecified dementia without behavioral disturbance: Secondary | ICD-10-CM | POA: Diagnosis not present

## 2016-11-06 DIAGNOSIS — M5136 Other intervertebral disc degeneration, lumbar region: Secondary | ICD-10-CM | POA: Diagnosis not present

## 2016-11-06 DIAGNOSIS — N183 Chronic kidney disease, stage 3 (moderate): Secondary | ICD-10-CM | POA: Diagnosis not present

## 2016-11-07 DIAGNOSIS — N183 Chronic kidney disease, stage 3 (moderate): Secondary | ICD-10-CM | POA: Diagnosis not present

## 2016-11-07 DIAGNOSIS — D631 Anemia in chronic kidney disease: Secondary | ICD-10-CM | POA: Diagnosis not present

## 2016-11-07 DIAGNOSIS — M80051D Age-related osteoporosis with current pathological fracture, right femur, subsequent encounter for fracture with routine healing: Secondary | ICD-10-CM | POA: Diagnosis not present

## 2016-11-07 DIAGNOSIS — I129 Hypertensive chronic kidney disease with stage 1 through stage 4 chronic kidney disease, or unspecified chronic kidney disease: Secondary | ICD-10-CM | POA: Diagnosis not present

## 2016-11-07 DIAGNOSIS — M5136 Other intervertebral disc degeneration, lumbar region: Secondary | ICD-10-CM | POA: Diagnosis not present

## 2016-11-07 DIAGNOSIS — F0390 Unspecified dementia without behavioral disturbance: Secondary | ICD-10-CM | POA: Diagnosis not present

## 2016-11-08 DIAGNOSIS — G5603 Carpal tunnel syndrome, bilateral upper limbs: Secondary | ICD-10-CM | POA: Diagnosis not present

## 2016-11-08 DIAGNOSIS — M62838 Other muscle spasm: Secondary | ICD-10-CM | POA: Diagnosis not present

## 2016-11-08 DIAGNOSIS — R413 Other amnesia: Secondary | ICD-10-CM | POA: Diagnosis not present

## 2016-11-08 DIAGNOSIS — R262 Difficulty in walking, not elsewhere classified: Secondary | ICD-10-CM | POA: Diagnosis not present

## 2016-11-08 DIAGNOSIS — G2581 Restless legs syndrome: Secondary | ICD-10-CM | POA: Diagnosis not present

## 2016-11-11 DIAGNOSIS — I129 Hypertensive chronic kidney disease with stage 1 through stage 4 chronic kidney disease, or unspecified chronic kidney disease: Secondary | ICD-10-CM | POA: Diagnosis not present

## 2016-11-11 DIAGNOSIS — M5136 Other intervertebral disc degeneration, lumbar region: Secondary | ICD-10-CM | POA: Diagnosis not present

## 2016-11-11 DIAGNOSIS — F0390 Unspecified dementia without behavioral disturbance: Secondary | ICD-10-CM | POA: Diagnosis not present

## 2016-11-11 DIAGNOSIS — D631 Anemia in chronic kidney disease: Secondary | ICD-10-CM | POA: Diagnosis not present

## 2016-11-11 DIAGNOSIS — M80051D Age-related osteoporosis with current pathological fracture, right femur, subsequent encounter for fracture with routine healing: Secondary | ICD-10-CM | POA: Diagnosis not present

## 2016-11-11 DIAGNOSIS — N183 Chronic kidney disease, stage 3 (moderate): Secondary | ICD-10-CM | POA: Diagnosis not present

## 2016-11-12 DIAGNOSIS — F0390 Unspecified dementia without behavioral disturbance: Secondary | ICD-10-CM | POA: Diagnosis not present

## 2016-11-12 DIAGNOSIS — I129 Hypertensive chronic kidney disease with stage 1 through stage 4 chronic kidney disease, or unspecified chronic kidney disease: Secondary | ICD-10-CM | POA: Diagnosis not present

## 2016-11-12 DIAGNOSIS — M5136 Other intervertebral disc degeneration, lumbar region: Secondary | ICD-10-CM | POA: Diagnosis not present

## 2016-11-12 DIAGNOSIS — M80051D Age-related osteoporosis with current pathological fracture, right femur, subsequent encounter for fracture with routine healing: Secondary | ICD-10-CM | POA: Diagnosis not present

## 2016-11-12 DIAGNOSIS — D631 Anemia in chronic kidney disease: Secondary | ICD-10-CM | POA: Diagnosis not present

## 2016-11-12 DIAGNOSIS — N183 Chronic kidney disease, stage 3 (moderate): Secondary | ICD-10-CM | POA: Diagnosis not present

## 2016-11-13 DIAGNOSIS — D631 Anemia in chronic kidney disease: Secondary | ICD-10-CM | POA: Diagnosis not present

## 2016-11-13 DIAGNOSIS — F0390 Unspecified dementia without behavioral disturbance: Secondary | ICD-10-CM | POA: Diagnosis not present

## 2016-11-13 DIAGNOSIS — M80051D Age-related osteoporosis with current pathological fracture, right femur, subsequent encounter for fracture with routine healing: Secondary | ICD-10-CM | POA: Diagnosis not present

## 2016-11-13 DIAGNOSIS — I129 Hypertensive chronic kidney disease with stage 1 through stage 4 chronic kidney disease, or unspecified chronic kidney disease: Secondary | ICD-10-CM | POA: Diagnosis not present

## 2016-11-13 DIAGNOSIS — N183 Chronic kidney disease, stage 3 (moderate): Secondary | ICD-10-CM | POA: Diagnosis not present

## 2016-11-13 DIAGNOSIS — M5136 Other intervertebral disc degeneration, lumbar region: Secondary | ICD-10-CM | POA: Diagnosis not present

## 2016-11-14 DIAGNOSIS — N183 Chronic kidney disease, stage 3 (moderate): Secondary | ICD-10-CM | POA: Diagnosis not present

## 2016-11-14 DIAGNOSIS — M80051D Age-related osteoporosis with current pathological fracture, right femur, subsequent encounter for fracture with routine healing: Secondary | ICD-10-CM | POA: Diagnosis not present

## 2016-11-14 DIAGNOSIS — F0390 Unspecified dementia without behavioral disturbance: Secondary | ICD-10-CM | POA: Diagnosis not present

## 2016-11-14 DIAGNOSIS — M5136 Other intervertebral disc degeneration, lumbar region: Secondary | ICD-10-CM | POA: Diagnosis not present

## 2016-11-14 DIAGNOSIS — I129 Hypertensive chronic kidney disease with stage 1 through stage 4 chronic kidney disease, or unspecified chronic kidney disease: Secondary | ICD-10-CM | POA: Diagnosis not present

## 2016-11-14 DIAGNOSIS — D631 Anemia in chronic kidney disease: Secondary | ICD-10-CM | POA: Diagnosis not present

## 2016-11-15 DIAGNOSIS — M5136 Other intervertebral disc degeneration, lumbar region: Secondary | ICD-10-CM | POA: Diagnosis not present

## 2016-11-15 DIAGNOSIS — F0390 Unspecified dementia without behavioral disturbance: Secondary | ICD-10-CM | POA: Diagnosis not present

## 2016-11-15 DIAGNOSIS — D631 Anemia in chronic kidney disease: Secondary | ICD-10-CM | POA: Diagnosis not present

## 2016-11-15 DIAGNOSIS — I129 Hypertensive chronic kidney disease with stage 1 through stage 4 chronic kidney disease, or unspecified chronic kidney disease: Secondary | ICD-10-CM | POA: Diagnosis not present

## 2016-11-15 DIAGNOSIS — N183 Chronic kidney disease, stage 3 (moderate): Secondary | ICD-10-CM | POA: Diagnosis not present

## 2016-11-15 DIAGNOSIS — M80051D Age-related osteoporosis with current pathological fracture, right femur, subsequent encounter for fracture with routine healing: Secondary | ICD-10-CM | POA: Diagnosis not present

## 2016-11-18 ENCOUNTER — Emergency Department
Admission: EM | Admit: 2016-11-18 | Discharge: 2016-11-18 | Disposition: A | Discharge status: Home / Self Care | Payer: Medicare HMO | Attending: Emergency Medicine | Admitting: Emergency Medicine

## 2016-11-18 ENCOUNTER — Emergency Department: Payer: Medicare HMO

## 2016-11-18 ENCOUNTER — Encounter: Payer: Self-pay | Admitting: Intensive Care

## 2016-11-18 DIAGNOSIS — Z7982 Long term (current) use of aspirin: Secondary | ICD-10-CM | POA: Diagnosis not present

## 2016-11-18 DIAGNOSIS — E860 Dehydration: Secondary | ICD-10-CM | POA: Insufficient documentation

## 2016-11-18 DIAGNOSIS — N183 Chronic kidney disease, stage 3 (moderate): Secondary | ICD-10-CM | POA: Insufficient documentation

## 2016-11-18 DIAGNOSIS — R531 Weakness: Secondary | ICD-10-CM | POA: Insufficient documentation

## 2016-11-18 DIAGNOSIS — E039 Hypothyroidism, unspecified: Secondary | ICD-10-CM | POA: Diagnosis not present

## 2016-11-18 DIAGNOSIS — I129 Hypertensive chronic kidney disease with stage 1 through stage 4 chronic kidney disease, or unspecified chronic kidney disease: Secondary | ICD-10-CM | POA: Insufficient documentation

## 2016-11-18 DIAGNOSIS — M6281 Muscle weakness (generalized): Secondary | ICD-10-CM | POA: Diagnosis not present

## 2016-11-18 DIAGNOSIS — W19XXXA Unspecified fall, initial encounter: Secondary | ICD-10-CM | POA: Insufficient documentation

## 2016-11-18 DIAGNOSIS — R4182 Altered mental status, unspecified: Secondary | ICD-10-CM | POA: Diagnosis not present

## 2016-11-18 DIAGNOSIS — Z79899 Other long term (current) drug therapy: Secondary | ICD-10-CM | POA: Insufficient documentation

## 2016-11-18 LAB — COMPREHENSIVE METABOLIC PANEL
ALT: 14 U/L (ref 14–54)
AST: 21 U/L (ref 15–41)
Albumin: 3.9 g/dL (ref 3.5–5.0)
Alkaline Phosphatase: 93 U/L (ref 38–126)
Anion gap: 9 (ref 5–15)
BUN: 24 mg/dL — AB (ref 6–20)
CHLORIDE: 103 mmol/L (ref 101–111)
CO2: 26 mmol/L (ref 22–32)
Calcium: 9.3 mg/dL (ref 8.9–10.3)
Creatinine, Ser: 1.14 mg/dL — ABNORMAL HIGH (ref 0.44–1.00)
GFR calc Af Amer: 50 mL/min — ABNORMAL LOW (ref >60)
GFR, EST NON AFRICAN AMERICAN: 43 mL/min — AB (ref >60)
Glucose, Bld: 91 mg/dL (ref 65–99)
POTASSIUM: 4.3 mmol/L (ref 3.5–5.1)
SODIUM: 138 mmol/L (ref 135–145)
Total Bilirubin: 0.6 mg/dL (ref 0.3–1.2)
Total Protein: 6.5 g/dL (ref 6.5–8.1)

## 2016-11-18 LAB — URINALYSIS, COMPLETE (UACMP) WITH MICROSCOPIC
BACTERIA UA: NONE SEEN
Bilirubin Urine: NEGATIVE
GLUCOSE, UA: NEGATIVE mg/dL
HGB URINE DIPSTICK: NEGATIVE
KETONES UR: NEGATIVE mg/dL
Leukocytes, UA: NEGATIVE
NITRITE: NEGATIVE
PROTEIN: NEGATIVE mg/dL
RBC / HPF: NONE SEEN RBC/hpf (ref 0–5)
Specific Gravity, Urine: 1.01 (ref 1.005–1.030)
pH: 8 (ref 5.0–8.0)

## 2016-11-18 LAB — CBC
HCT: 33.5 % — ABNORMAL LOW (ref 35.0–47.0)
Hemoglobin: 11.1 g/dL — ABNORMAL LOW (ref 12.0–16.0)
MCH: 31.9 pg (ref 26.0–34.0)
MCHC: 33.1 g/dL (ref 32.0–36.0)
MCV: 96.4 fL (ref 80.0–100.0)
PLATELETS: 227 10*3/uL (ref 150–440)
RBC: 3.48 MIL/uL — AB (ref 3.80–5.20)
RDW: 13.9 % (ref 11.5–14.5)
WBC: 5.7 10*3/uL (ref 3.6–11.0)

## 2016-11-18 NOTE — ED Triage Notes (Signed)
Patient arrived from [facility] for weakness over the last couple pf days and new symtpoms of twitching. Patient uses walker to get around and has been having occurrences of twitching which result in patient falling. Per caregiver patient has fallen multiple times and hit head. Patient is A&O to place and self only. Patient able to follow commands

## 2016-11-18 NOTE — ED Notes (Signed)
X-ray at bedside

## 2016-11-18 NOTE — Care Management Note (Signed)
Case Management Note  Patient Details  Name: Carrie Calhoun MRN: [id number] Date of Birth: Oct 08, 1930  Subjective/Objective:       Spoke to AMber , the RN for the pt. To let her know the patient has Home Services through [facility].             Action/Plan:   Expected Discharge Date:                  Expected Discharge Plan:     In-House Referral:     Discharge planning Services     Post Acute Care Choice:    Choice offered to:     DME Arranged:    DME Agency:     HH Arranged:    HH Agency:     Status of Service:     If discussed at [employer] of Stay Meetings, dates discussed:    Additional Comments:  Berna Bue, RN 11/18/2016, 1:46 PM

## 2016-11-18 NOTE — ED Provider Notes (Signed)
[facility] Emergency Department Provider Note   ____________________________________________    I have reviewed the triage vital signs and the nursing notes.   HISTORY  Chief Complaint Fall and Weakness  Patient with a history of dementia   HPI Carrie Calhoun is a 83 y.o. female who presents with fall and weakness. Patient is accompanied by a caregiver. Caregiver reports the patient reportedly fell over the weekend and may have hit her head. When she came on shift this morning she felt the patient was not herself and weak. She is having more difficulty ambulating than is typical for her. In addition she continues to have unusual spasms that lead to her being unsteady on her feet. She was seen for this in the ED 2 weeks ago. Patient has no complaints and asks to go home   Past Medical History:  Diagnosis Date  . Anemia, unspecified   . Anxiety   . Atrioventricular block 06/09/2008   1st degree with bradycardia  . Cardiac arrest (HCC)   . Chicken pox   . CKD (chronic kidney disease) stage 3, GFR 30-59 ml/min   . Diverticulosis   . History of cardiac arrest    during prior cataract surgery, thought to be from anesthesia, per pt. Followed by Dr. Darrold Junker  . Hyperlipidemia, unspecified   . Hypertension   . Hypothyroidism, unspecified   . Osteopenia   . Renal disorder     Patient Active Problem List   Diagnosis Date Noted  . DNR (do not resuscitate) 09/12/2016  . Hip fracture (HCC) 09/10/2016  . Osteopenia 09/02/2016  . Hyperlipidemia, unspecified 09/02/2016  . DDD (degenerative disc disease), lumbar 09/02/2016  . CKD (chronic kidney disease), stage III 09/02/2016  . Anemia, unspecified 09/02/2016  . Ataxia 07/22/2016  . Dementia arising in the senium and presenium 07/15/2016  . Multiple rib fractures 07/04/2016  . Moderate major depression, single episode (HCC) 06/14/2016  . Difficulty walking 01/08/2016  . Hypothyroidism due to  acquired atrophy of thyroid 07/21/2015  . Paresthesia of both hands 06/28/2014  . Bilateral carpal tunnel syndrome 06/28/2014  . Tremor 12/28/2013  . Restless legs 12/28/2013  . Loss of memory 12/28/2013  . Essential hypertension 12/28/2013    Past Surgical History:  Procedure Laterality Date  . APPENDECTOMY  1970  . CATARACT EXTRACTION, BILATERAL    . COLONOSCOPY  04/29/2006  . FOOT SURGERY Bilateral   . INTRAMEDULLARY (IM) NAIL INTERTROCHANTERIC Right 09/10/2016   Procedure: INTRAMEDULLARY (IM) NAIL INTERTROCHANTRIC;  Surgeon: [name], MD;  Location: [facility] ORS;  Service: Orthopedics;  Laterality: Right;  . TUBAL LIGATION Bilateral 1970    Prior to Admission medications   Medication Sig Start Date End Date Taking? Authorizing Provider  acetaminophen (TYLENOL) 325 MG tablet Take 2 tablets (650 mg total) by mouth every 6 (six) hours as needed for mild pain (or Fever >/= 101). 07/08/16  Yes Sudini, Wardell Heath, MD  acetaminophen (TYLENOL) 325 MG tablet Take 650 mg by mouth 4 (four) times daily.   Yes [provider]  aspirin EC 81 MG tablet Take 1 tablet by mouth daily.   Yes [provider]  busPIRone (BUSPAR) 15 MG tablet Take 1 tablet by mouth daily.  05/13/16  Yes [provider]  Calcium Carbonate-Vitamin D 600-400 MG-UNIT tablet Take 1 tablet by mouth daily.    Yes [provider]  citalopram (CELEXA) 10 MG tablet Take 1 tablet (10 mg total) by mouth every other day. 07/08/16  Yes  Milagros Loll, MD  levothyroxine (SYNTHROID, LEVOTHROID) 75 MCG tablet Take 1 tablet by mouth daily before breakfast.  03/11/16  Yes [provider]  Lifitegrast 5 % SOLN Place 1 drop into both eyes 2 (two) times daily.    Yes [provider]  Multiple Vitamin (MULTIVITAMIN) tablet Take 1 tablet by mouth daily.   Yes [provider]  Omega-3 Fatty Acids (FISH OIL PO) Take 1 tablet by mouth daily.   Yes [provider]  docusate sodium  (COLACE) 100 MG capsule Take 1 capsule (100 mg total) by mouth 2 (two) times daily. Patient not taking: Reported on 11/18/2016 09/13/16   Milagros Loll, MD  donepezil (ARICEPT) 5 MG tablet Take 1 tablet by mouth daily.  05/13/16   [provider]  methocarbamol (ROBAXIN) 500 MG tablet Take 1 tablet (500 mg total) by mouth every 6 (six) hours as needed for muscle spasms. Patient not taking: Reported on 11/18/2016 09/13/16   Milagros Loll, MD  mirabegron ER (MYRBETRIQ) 25 MG TB24 tablet Take 1 tablet (25 mg total) by mouth daily. Patient not taking: Reported on 11/18/2016 09/02/16   Michiel Cowboy A, PA-C  traMADol (ULTRAM) 50 MG tablet Take 1 tablet (50 mg total) by mouth every 4 (four) hours as needed. Patient not taking: Reported on 11/18/2016 10/14/16   Lorenso Quarry, NP  traMADol (ULTRAM) 50 MG tablet Take 1 tablet (50 mg total) by mouth 4 (four) times daily. Scheduled for pain. Ok to give prn doses in addition to scheduled for severe pain Patient not taking: Reported on 11/18/2016 10/14/16   Lorenso Quarry, NP     Allergies Alendronate; Lidocaine; Phenylephrine; Phenyltoloxamine-acetaminophen; and Azithromycin  Family History  Problem Relation Age of Onset  . Hypertension Mother   . Stroke Mother   . Osteoporosis Mother   . Thyroid disease Mother   . Breast cancer Maternal Aunt   . Breast cancer Maternal Aunt     Social History Social History  Substance Use Topics  . Smoking status: Never Smoker  . Smokeless tobacco: Never Used  . Alcohol use No    Review of Systems  Constitutional: No Fevers reported Eyes: No discharge ENT: No Neck pain Cardiovascular: Denies chest pain. Respiratory: Denies shortness of breath. Gastrointestinal: No vomiting Genitourinary: Negative for foul smelling urine Musculoskeletal: Negative for back pain. Skin: Negative for rash. Neurological: Negative for Focal weakness   ____________________________________________   PHYSICAL  EXAM:  VITAL SIGNS: ED Triage Vitals [11/18/16 1125]  Enc Vitals Group     BP (!) 147/96     Pulse Rate 61     Resp 16     Temp 98.2 F (36.8 C)     Temp Source Oral     SpO2 98 %     Weight 46.7 kg (103 lb)     Height 1.524 m (5')     Head Circumference      Peak Flow      Pain Score      Pain Loc      Pain Edu?      Excl. in GC?     Constitutional: Alert. No acute distress. Pleasant and interactive Eyes: Conjunctivae are normal.   Nose: No congestion/rhinnorhea. Mouth/Throat: Mucous membranes are moist.   Neck:  Painless ROM, No vertebral has palpation Cardiovascular: Normal rate, regular rhythm. Grossly normal heart sounds.  Good peripheral circulation. Respiratory: Normal respiratory effort.  No retractions. Lungs CTAB. Gastrointestinal: Soft and nontender. No distention.  No CVA tenderness. Genitourinary:  deferred Musculoskeletal: Normal range of motion of all extremities, no pain with axial load of both hips.  Warm and well perfused Neurologic:   No gross focal neurologic deficits are appreciated.  Skin:  Skin is warm, dry and intact. No rash noted. Psychiatric: Mood and affect are normal. Speech and behavior are normal.  ____________________________________________   LABS (all labs ordered are listed, but only abnormal results are displayed)  Labs Reviewed  CBC - Abnormal; Notable for the following:       Result Value   RBC 3.48 (*)    Hemoglobin 11.1 (*)    HCT 33.5 (*)    All other components within normal limits  COMPREHENSIVE METABOLIC PANEL - Abnormal; Notable for the following:    BUN 24 (*)    Creatinine, Ser 1.14 (*)    GFR calc non Af Amer 43 (*)    GFR calc Af Amer 50 (*)    All other components within normal limits  URINALYSIS, COMPLETE (UACMP) WITH MICROSCOPIC - Abnormal; Notable for the following:    Color, Urine STRAW (*)    APPearance CLEAR (*)    Squamous Epithelial / LPF 0-5 (*)    All other components within normal limits    ____________________________________________  EKG  None ____________________________________________  RADIOLOGY  CT head unremarkable Chest x-ray and pelvis x-ray no fracture ____________________________________________   PROCEDURES  Procedure(s) performed: No    Critical Care performed: No ____________________________________________   INITIAL IMPRESSION / ASSESSMENT AND PLAN / ED COURSE  Pertinent labs & imaging results that were available during my care of the patient were reviewed by me and considered in my medical decision making (see chart for details).  Patient well-appearing and in no acute distress. Exam is overall unremarkable, no evidence of traumatic injury. Lab work is reassuring, patient is mildly dehydrated. Discussed with daughter who arrived reports the patient recently had a sleep study and has extensive outpatient workup underway. In fact they are planning on moving the patient to assisted living today  Given the above and reassuring workup I feel the patient is appropriate for discharge at this time, family agrees      ____________________________________________   FINAL CLINICAL IMPRESSION(S) / ED DIAGNOSES  Final diagnoses:  Generalized weakness  Dehydration      NEW MEDICATIONS STARTED DURING THIS VISIT:  New Prescriptions   No medications on file     Note:  This document was prepared using Dragon voice recognition software and may include unintentional dictation errors.    Jene Every, MD 11/18/16 [id number]

## 2016-11-20 DIAGNOSIS — D631 Anemia in chronic kidney disease: Secondary | ICD-10-CM | POA: Diagnosis not present

## 2016-11-20 DIAGNOSIS — E034 Atrophy of thyroid (acquired): Secondary | ICD-10-CM | POA: Diagnosis not present

## 2016-11-20 DIAGNOSIS — E784 Other hyperlipidemia: Secondary | ICD-10-CM | POA: Diagnosis not present

## 2016-11-20 DIAGNOSIS — I10 Essential (primary) hypertension: Secondary | ICD-10-CM | POA: Diagnosis not present

## 2016-11-20 DIAGNOSIS — F0390 Unspecified dementia without behavioral disturbance: Secondary | ICD-10-CM | POA: Diagnosis not present

## 2016-11-20 DIAGNOSIS — M8000XS Age-related osteoporosis with current pathological fracture, unspecified site, sequela: Secondary | ICD-10-CM | POA: Diagnosis not present

## 2016-11-20 DIAGNOSIS — M5136 Other intervertebral disc degeneration, lumbar region: Secondary | ICD-10-CM | POA: Diagnosis not present

## 2016-11-20 DIAGNOSIS — I129 Hypertensive chronic kidney disease with stage 1 through stage 4 chronic kidney disease, or unspecified chronic kidney disease: Secondary | ICD-10-CM | POA: Diagnosis not present

## 2016-11-20 DIAGNOSIS — M80051D Age-related osteoporosis with current pathological fracture, right femur, subsequent encounter for fracture with routine healing: Secondary | ICD-10-CM | POA: Diagnosis not present

## 2016-11-20 DIAGNOSIS — N183 Chronic kidney disease, stage 3 (moderate): Secondary | ICD-10-CM | POA: Diagnosis not present

## 2016-11-20 DIAGNOSIS — F321 Major depressive disorder, single episode, moderate: Secondary | ICD-10-CM | POA: Diagnosis not present

## 2016-11-20 DIAGNOSIS — D649 Anemia, unspecified: Secondary | ICD-10-CM | POA: Diagnosis not present

## 2016-11-21 DIAGNOSIS — M80051D Age-related osteoporosis with current pathological fracture, right femur, subsequent encounter for fracture with routine healing: Secondary | ICD-10-CM | POA: Diagnosis not present

## 2016-11-21 DIAGNOSIS — N183 Chronic kidney disease, stage 3 (moderate): Secondary | ICD-10-CM | POA: Diagnosis not present

## 2016-11-21 DIAGNOSIS — F0390 Unspecified dementia without behavioral disturbance: Secondary | ICD-10-CM | POA: Diagnosis not present

## 2016-11-21 DIAGNOSIS — D631 Anemia in chronic kidney disease: Secondary | ICD-10-CM | POA: Diagnosis not present

## 2016-11-21 DIAGNOSIS — M5136 Other intervertebral disc degeneration, lumbar region: Secondary | ICD-10-CM | POA: Diagnosis not present

## 2016-11-21 DIAGNOSIS — I129 Hypertensive chronic kidney disease with stage 1 through stage 4 chronic kidney disease, or unspecified chronic kidney disease: Secondary | ICD-10-CM | POA: Diagnosis not present

## 2016-11-22 DIAGNOSIS — M80051D Age-related osteoporosis with current pathological fracture, right femur, subsequent encounter for fracture with routine healing: Secondary | ICD-10-CM | POA: Diagnosis not present

## 2016-11-22 DIAGNOSIS — D631 Anemia in chronic kidney disease: Secondary | ICD-10-CM | POA: Diagnosis not present

## 2016-11-22 DIAGNOSIS — N183 Chronic kidney disease, stage 3 (moderate): Secondary | ICD-10-CM | POA: Diagnosis not present

## 2016-11-22 DIAGNOSIS — F0390 Unspecified dementia without behavioral disturbance: Secondary | ICD-10-CM | POA: Diagnosis not present

## 2016-11-22 DIAGNOSIS — I129 Hypertensive chronic kidney disease with stage 1 through stage 4 chronic kidney disease, or unspecified chronic kidney disease: Secondary | ICD-10-CM | POA: Diagnosis not present

## 2016-11-22 DIAGNOSIS — M5136 Other intervertebral disc degeneration, lumbar region: Secondary | ICD-10-CM | POA: Diagnosis not present

## 2016-11-26 DIAGNOSIS — D631 Anemia in chronic kidney disease: Secondary | ICD-10-CM | POA: Diagnosis not present

## 2016-11-26 DIAGNOSIS — M5136 Other intervertebral disc degeneration, lumbar region: Secondary | ICD-10-CM | POA: Diagnosis not present

## 2016-11-26 DIAGNOSIS — M80051D Age-related osteoporosis with current pathological fracture, right femur, subsequent encounter for fracture with routine healing: Secondary | ICD-10-CM | POA: Diagnosis not present

## 2016-11-26 DIAGNOSIS — F0390 Unspecified dementia without behavioral disturbance: Secondary | ICD-10-CM | POA: Diagnosis not present

## 2016-11-26 DIAGNOSIS — I129 Hypertensive chronic kidney disease with stage 1 through stage 4 chronic kidney disease, or unspecified chronic kidney disease: Secondary | ICD-10-CM | POA: Diagnosis not present

## 2016-11-26 DIAGNOSIS — N183 Chronic kidney disease, stage 3 (moderate): Secondary | ICD-10-CM | POA: Diagnosis not present

## 2016-11-27 DIAGNOSIS — D631 Anemia in chronic kidney disease: Secondary | ICD-10-CM | POA: Diagnosis not present

## 2016-11-27 DIAGNOSIS — M80051D Age-related osteoporosis with current pathological fracture, right femur, subsequent encounter for fracture with routine healing: Secondary | ICD-10-CM | POA: Diagnosis not present

## 2016-11-27 DIAGNOSIS — I129 Hypertensive chronic kidney disease with stage 1 through stage 4 chronic kidney disease, or unspecified chronic kidney disease: Secondary | ICD-10-CM | POA: Diagnosis not present

## 2016-11-27 DIAGNOSIS — F0390 Unspecified dementia without behavioral disturbance: Secondary | ICD-10-CM | POA: Diagnosis not present

## 2016-11-27 DIAGNOSIS — N183 Chronic kidney disease, stage 3 (moderate): Secondary | ICD-10-CM | POA: Diagnosis not present

## 2016-11-27 DIAGNOSIS — M5136 Other intervertebral disc degeneration, lumbar region: Secondary | ICD-10-CM | POA: Diagnosis not present

## 2016-11-28 DIAGNOSIS — M80051D Age-related osteoporosis with current pathological fracture, right femur, subsequent encounter for fracture with routine healing: Secondary | ICD-10-CM | POA: Diagnosis not present

## 2016-11-28 DIAGNOSIS — M5136 Other intervertebral disc degeneration, lumbar region: Secondary | ICD-10-CM | POA: Diagnosis not present

## 2016-11-28 DIAGNOSIS — D631 Anemia in chronic kidney disease: Secondary | ICD-10-CM | POA: Diagnosis not present

## 2016-11-28 DIAGNOSIS — I129 Hypertensive chronic kidney disease with stage 1 through stage 4 chronic kidney disease, or unspecified chronic kidney disease: Secondary | ICD-10-CM | POA: Diagnosis not present

## 2016-11-28 DIAGNOSIS — N183 Chronic kidney disease, stage 3 (moderate): Secondary | ICD-10-CM | POA: Diagnosis not present

## 2016-11-28 DIAGNOSIS — F0390 Unspecified dementia without behavioral disturbance: Secondary | ICD-10-CM | POA: Diagnosis not present

## 2016-12-02 DIAGNOSIS — I129 Hypertensive chronic kidney disease with stage 1 through stage 4 chronic kidney disease, or unspecified chronic kidney disease: Secondary | ICD-10-CM | POA: Diagnosis not present

## 2016-12-02 DIAGNOSIS — M80051D Age-related osteoporosis with current pathological fracture, right femur, subsequent encounter for fracture with routine healing: Secondary | ICD-10-CM | POA: Diagnosis not present

## 2016-12-02 DIAGNOSIS — N183 Chronic kidney disease, stage 3 (moderate): Secondary | ICD-10-CM | POA: Diagnosis not present

## 2016-12-02 DIAGNOSIS — F0390 Unspecified dementia without behavioral disturbance: Secondary | ICD-10-CM | POA: Diagnosis not present

## 2016-12-02 DIAGNOSIS — D631 Anemia in chronic kidney disease: Secondary | ICD-10-CM | POA: Diagnosis not present

## 2016-12-02 DIAGNOSIS — M5136 Other intervertebral disc degeneration, lumbar region: Secondary | ICD-10-CM | POA: Diagnosis not present

## 2016-12-04 DIAGNOSIS — D631 Anemia in chronic kidney disease: Secondary | ICD-10-CM | POA: Diagnosis not present

## 2016-12-04 DIAGNOSIS — M80051D Age-related osteoporosis with current pathological fracture, right femur, subsequent encounter for fracture with routine healing: Secondary | ICD-10-CM | POA: Diagnosis not present

## 2016-12-04 DIAGNOSIS — F0390 Unspecified dementia without behavioral disturbance: Secondary | ICD-10-CM | POA: Diagnosis not present

## 2016-12-04 DIAGNOSIS — M5136 Other intervertebral disc degeneration, lumbar region: Secondary | ICD-10-CM | POA: Diagnosis not present

## 2016-12-04 DIAGNOSIS — N183 Chronic kidney disease, stage 3 (moderate): Secondary | ICD-10-CM | POA: Diagnosis not present

## 2016-12-04 DIAGNOSIS — I129 Hypertensive chronic kidney disease with stage 1 through stage 4 chronic kidney disease, or unspecified chronic kidney disease: Secondary | ICD-10-CM | POA: Diagnosis not present

## 2016-12-05 DIAGNOSIS — F0390 Unspecified dementia without behavioral disturbance: Secondary | ICD-10-CM | POA: Diagnosis not present

## 2016-12-05 DIAGNOSIS — I129 Hypertensive chronic kidney disease with stage 1 through stage 4 chronic kidney disease, or unspecified chronic kidney disease: Secondary | ICD-10-CM | POA: Diagnosis not present

## 2016-12-05 DIAGNOSIS — D631 Anemia in chronic kidney disease: Secondary | ICD-10-CM | POA: Diagnosis not present

## 2016-12-05 DIAGNOSIS — N183 Chronic kidney disease, stage 3 (moderate): Secondary | ICD-10-CM | POA: Diagnosis not present

## 2016-12-05 DIAGNOSIS — M5136 Other intervertebral disc degeneration, lumbar region: Secondary | ICD-10-CM | POA: Diagnosis not present

## 2016-12-05 DIAGNOSIS — M80051D Age-related osteoporosis with current pathological fracture, right femur, subsequent encounter for fracture with routine healing: Secondary | ICD-10-CM | POA: Diagnosis not present

## 2016-12-09 DIAGNOSIS — F0390 Unspecified dementia without behavioral disturbance: Secondary | ICD-10-CM | POA: Diagnosis not present

## 2016-12-09 DIAGNOSIS — I129 Hypertensive chronic kidney disease with stage 1 through stage 4 chronic kidney disease, or unspecified chronic kidney disease: Secondary | ICD-10-CM | POA: Diagnosis not present

## 2016-12-09 DIAGNOSIS — N183 Chronic kidney disease, stage 3 (moderate): Secondary | ICD-10-CM | POA: Diagnosis not present

## 2016-12-09 DIAGNOSIS — F321 Major depressive disorder, single episode, moderate: Secondary | ICD-10-CM | POA: Diagnosis not present

## 2016-12-09 DIAGNOSIS — D631 Anemia in chronic kidney disease: Secondary | ICD-10-CM | POA: Diagnosis not present

## 2016-12-09 DIAGNOSIS — E7849 Other hyperlipidemia: Secondary | ICD-10-CM | POA: Diagnosis not present

## 2016-12-09 DIAGNOSIS — M8000XS Age-related osteoporosis with current pathological fracture, unspecified site, sequela: Secondary | ICD-10-CM | POA: Diagnosis not present

## 2016-12-09 DIAGNOSIS — M5136 Other intervertebral disc degeneration, lumbar region: Secondary | ICD-10-CM | POA: Diagnosis not present

## 2016-12-09 DIAGNOSIS — D649 Anemia, unspecified: Secondary | ICD-10-CM | POA: Diagnosis not present

## 2016-12-09 DIAGNOSIS — M80051D Age-related osteoporosis with current pathological fracture, right femur, subsequent encounter for fracture with routine healing: Secondary | ICD-10-CM | POA: Diagnosis not present

## 2016-12-09 DIAGNOSIS — I10 Essential (primary) hypertension: Secondary | ICD-10-CM | POA: Diagnosis not present

## 2016-12-09 DIAGNOSIS — E034 Atrophy of thyroid (acquired): Secondary | ICD-10-CM | POA: Diagnosis not present

## 2016-12-10 DIAGNOSIS — D631 Anemia in chronic kidney disease: Secondary | ICD-10-CM | POA: Diagnosis not present

## 2016-12-10 DIAGNOSIS — M80051D Age-related osteoporosis with current pathological fracture, right femur, subsequent encounter for fracture with routine healing: Secondary | ICD-10-CM | POA: Diagnosis not present

## 2016-12-10 DIAGNOSIS — M5136 Other intervertebral disc degeneration, lumbar region: Secondary | ICD-10-CM | POA: Diagnosis not present

## 2016-12-10 DIAGNOSIS — I129 Hypertensive chronic kidney disease with stage 1 through stage 4 chronic kidney disease, or unspecified chronic kidney disease: Secondary | ICD-10-CM | POA: Diagnosis not present

## 2016-12-10 DIAGNOSIS — F0390 Unspecified dementia without behavioral disturbance: Secondary | ICD-10-CM | POA: Diagnosis not present

## 2016-12-10 DIAGNOSIS — N183 Chronic kidney disease, stage 3 (moderate): Secondary | ICD-10-CM | POA: Diagnosis not present

## 2016-12-11 DIAGNOSIS — M80051D Age-related osteoporosis with current pathological fracture, right femur, subsequent encounter for fracture with routine healing: Secondary | ICD-10-CM | POA: Diagnosis not present

## 2016-12-11 DIAGNOSIS — I129 Hypertensive chronic kidney disease with stage 1 through stage 4 chronic kidney disease, or unspecified chronic kidney disease: Secondary | ICD-10-CM | POA: Diagnosis not present

## 2016-12-11 DIAGNOSIS — N183 Chronic kidney disease, stage 3 (moderate): Secondary | ICD-10-CM | POA: Diagnosis not present

## 2016-12-11 DIAGNOSIS — D631 Anemia in chronic kidney disease: Secondary | ICD-10-CM | POA: Diagnosis not present

## 2016-12-11 DIAGNOSIS — F0390 Unspecified dementia without behavioral disturbance: Secondary | ICD-10-CM | POA: Diagnosis not present

## 2016-12-11 DIAGNOSIS — M5136 Other intervertebral disc degeneration, lumbar region: Secondary | ICD-10-CM | POA: Diagnosis not present

## 2016-12-12 DIAGNOSIS — M5136 Other intervertebral disc degeneration, lumbar region: Secondary | ICD-10-CM | POA: Diagnosis not present

## 2016-12-12 DIAGNOSIS — F0390 Unspecified dementia without behavioral disturbance: Secondary | ICD-10-CM | POA: Diagnosis not present

## 2016-12-12 DIAGNOSIS — D631 Anemia in chronic kidney disease: Secondary | ICD-10-CM | POA: Diagnosis not present

## 2016-12-12 DIAGNOSIS — M80051D Age-related osteoporosis with current pathological fracture, right femur, subsequent encounter for fracture with routine healing: Secondary | ICD-10-CM | POA: Diagnosis not present

## 2016-12-12 DIAGNOSIS — N183 Chronic kidney disease, stage 3 (moderate): Secondary | ICD-10-CM | POA: Diagnosis not present

## 2016-12-12 DIAGNOSIS — I129 Hypertensive chronic kidney disease with stage 1 through stage 4 chronic kidney disease, or unspecified chronic kidney disease: Secondary | ICD-10-CM | POA: Diagnosis not present

## 2016-12-13 DIAGNOSIS — M5136 Other intervertebral disc degeneration, lumbar region: Secondary | ICD-10-CM | POA: Diagnosis not present

## 2016-12-13 DIAGNOSIS — G5603 Carpal tunnel syndrome, bilateral upper limbs: Secondary | ICD-10-CM | POA: Diagnosis not present

## 2016-12-13 DIAGNOSIS — M8000XA Age-related osteoporosis with current pathological fracture, unspecified site, initial encounter for fracture: Secondary | ICD-10-CM | POA: Diagnosis not present

## 2016-12-16 DIAGNOSIS — M5136 Other intervertebral disc degeneration, lumbar region: Secondary | ICD-10-CM | POA: Diagnosis not present

## 2016-12-16 DIAGNOSIS — I129 Hypertensive chronic kidney disease with stage 1 through stage 4 chronic kidney disease, or unspecified chronic kidney disease: Secondary | ICD-10-CM | POA: Diagnosis not present

## 2016-12-16 DIAGNOSIS — M80051D Age-related osteoporosis with current pathological fracture, right femur, subsequent encounter for fracture with routine healing: Secondary | ICD-10-CM | POA: Diagnosis not present

## 2016-12-16 DIAGNOSIS — F0390 Unspecified dementia without behavioral disturbance: Secondary | ICD-10-CM | POA: Diagnosis not present

## 2016-12-16 DIAGNOSIS — N183 Chronic kidney disease, stage 3 (moderate): Secondary | ICD-10-CM | POA: Diagnosis not present

## 2016-12-16 DIAGNOSIS — D631 Anemia in chronic kidney disease: Secondary | ICD-10-CM | POA: Diagnosis not present

## 2016-12-17 DIAGNOSIS — M80051D Age-related osteoporosis with current pathological fracture, right femur, subsequent encounter for fracture with routine healing: Secondary | ICD-10-CM | POA: Diagnosis not present

## 2016-12-17 DIAGNOSIS — I129 Hypertensive chronic kidney disease with stage 1 through stage 4 chronic kidney disease, or unspecified chronic kidney disease: Secondary | ICD-10-CM | POA: Diagnosis not present

## 2016-12-17 DIAGNOSIS — N183 Chronic kidney disease, stage 3 (moderate): Secondary | ICD-10-CM | POA: Diagnosis not present

## 2016-12-17 DIAGNOSIS — M5136 Other intervertebral disc degeneration, lumbar region: Secondary | ICD-10-CM | POA: Diagnosis not present

## 2016-12-17 DIAGNOSIS — D631 Anemia in chronic kidney disease: Secondary | ICD-10-CM | POA: Diagnosis not present

## 2016-12-17 DIAGNOSIS — F0390 Unspecified dementia without behavioral disturbance: Secondary | ICD-10-CM | POA: Diagnosis not present

## 2016-12-19 DIAGNOSIS — M80051D Age-related osteoporosis with current pathological fracture, right femur, subsequent encounter for fracture with routine healing: Secondary | ICD-10-CM | POA: Diagnosis not present

## 2016-12-19 DIAGNOSIS — D631 Anemia in chronic kidney disease: Secondary | ICD-10-CM | POA: Diagnosis not present

## 2016-12-19 DIAGNOSIS — N183 Chronic kidney disease, stage 3 (moderate): Secondary | ICD-10-CM | POA: Diagnosis not present

## 2016-12-19 DIAGNOSIS — F0390 Unspecified dementia without behavioral disturbance: Secondary | ICD-10-CM | POA: Diagnosis not present

## 2016-12-19 DIAGNOSIS — I129 Hypertensive chronic kidney disease with stage 1 through stage 4 chronic kidney disease, or unspecified chronic kidney disease: Secondary | ICD-10-CM | POA: Diagnosis not present

## 2016-12-19 DIAGNOSIS — M5136 Other intervertebral disc degeneration, lumbar region: Secondary | ICD-10-CM | POA: Diagnosis not present

## 2016-12-23 DIAGNOSIS — N183 Chronic kidney disease, stage 3 (moderate): Secondary | ICD-10-CM | POA: Diagnosis not present

## 2016-12-23 DIAGNOSIS — F0390 Unspecified dementia without behavioral disturbance: Secondary | ICD-10-CM | POA: Diagnosis not present

## 2016-12-23 DIAGNOSIS — D631 Anemia in chronic kidney disease: Secondary | ICD-10-CM | POA: Diagnosis not present

## 2016-12-23 DIAGNOSIS — I129 Hypertensive chronic kidney disease with stage 1 through stage 4 chronic kidney disease, or unspecified chronic kidney disease: Secondary | ICD-10-CM | POA: Diagnosis not present

## 2016-12-23 DIAGNOSIS — M5136 Other intervertebral disc degeneration, lumbar region: Secondary | ICD-10-CM | POA: Diagnosis not present

## 2016-12-23 DIAGNOSIS — M80051D Age-related osteoporosis with current pathological fracture, right femur, subsequent encounter for fracture with routine healing: Secondary | ICD-10-CM | POA: Diagnosis not present

## 2016-12-24 DIAGNOSIS — F0390 Unspecified dementia without behavioral disturbance: Secondary | ICD-10-CM | POA: Diagnosis not present

## 2016-12-24 DIAGNOSIS — D631 Anemia in chronic kidney disease: Secondary | ICD-10-CM | POA: Diagnosis not present

## 2016-12-24 DIAGNOSIS — M80051D Age-related osteoporosis with current pathological fracture, right femur, subsequent encounter for fracture with routine healing: Secondary | ICD-10-CM | POA: Diagnosis not present

## 2016-12-24 DIAGNOSIS — I129 Hypertensive chronic kidney disease with stage 1 through stage 4 chronic kidney disease, or unspecified chronic kidney disease: Secondary | ICD-10-CM | POA: Diagnosis not present

## 2016-12-24 DIAGNOSIS — M5136 Other intervertebral disc degeneration, lumbar region: Secondary | ICD-10-CM | POA: Diagnosis not present

## 2016-12-24 DIAGNOSIS — N183 Chronic kidney disease, stage 3 (moderate): Secondary | ICD-10-CM | POA: Diagnosis not present

## 2016-12-25 DIAGNOSIS — F0390 Unspecified dementia without behavioral disturbance: Secondary | ICD-10-CM | POA: Diagnosis not present

## 2016-12-25 DIAGNOSIS — M5136 Other intervertebral disc degeneration, lumbar region: Secondary | ICD-10-CM | POA: Diagnosis not present

## 2016-12-25 DIAGNOSIS — D631 Anemia in chronic kidney disease: Secondary | ICD-10-CM | POA: Diagnosis not present

## 2016-12-25 DIAGNOSIS — N183 Chronic kidney disease, stage 3 (moderate): Secondary | ICD-10-CM | POA: Diagnosis not present

## 2016-12-25 DIAGNOSIS — M80051D Age-related osteoporosis with current pathological fracture, right femur, subsequent encounter for fracture with routine healing: Secondary | ICD-10-CM | POA: Diagnosis not present

## 2016-12-25 DIAGNOSIS — I129 Hypertensive chronic kidney disease with stage 1 through stage 4 chronic kidney disease, or unspecified chronic kidney disease: Secondary | ICD-10-CM | POA: Diagnosis not present

## 2016-12-30 DIAGNOSIS — N183 Chronic kidney disease, stage 3 (moderate): Secondary | ICD-10-CM | POA: Diagnosis not present

## 2016-12-30 DIAGNOSIS — D631 Anemia in chronic kidney disease: Secondary | ICD-10-CM | POA: Diagnosis not present

## 2016-12-30 DIAGNOSIS — M80051D Age-related osteoporosis with current pathological fracture, right femur, subsequent encounter for fracture with routine healing: Secondary | ICD-10-CM | POA: Diagnosis not present

## 2016-12-30 DIAGNOSIS — I129 Hypertensive chronic kidney disease with stage 1 through stage 4 chronic kidney disease, or unspecified chronic kidney disease: Secondary | ICD-10-CM | POA: Diagnosis not present

## 2016-12-30 DIAGNOSIS — F0390 Unspecified dementia without behavioral disturbance: Secondary | ICD-10-CM | POA: Diagnosis not present

## 2016-12-30 DIAGNOSIS — M5136 Other intervertebral disc degeneration, lumbar region: Secondary | ICD-10-CM | POA: Diagnosis not present

## 2016-12-31 DIAGNOSIS — M5136 Other intervertebral disc degeneration, lumbar region: Secondary | ICD-10-CM | POA: Diagnosis not present

## 2016-12-31 DIAGNOSIS — N183 Chronic kidney disease, stage 3 (moderate): Secondary | ICD-10-CM | POA: Diagnosis not present

## 2016-12-31 DIAGNOSIS — D631 Anemia in chronic kidney disease: Secondary | ICD-10-CM | POA: Diagnosis not present

## 2016-12-31 DIAGNOSIS — I129 Hypertensive chronic kidney disease with stage 1 through stage 4 chronic kidney disease, or unspecified chronic kidney disease: Secondary | ICD-10-CM | POA: Diagnosis not present

## 2016-12-31 DIAGNOSIS — F0390 Unspecified dementia without behavioral disturbance: Secondary | ICD-10-CM | POA: Diagnosis not present

## 2016-12-31 DIAGNOSIS — M80051D Age-related osteoporosis with current pathological fracture, right femur, subsequent encounter for fracture with routine healing: Secondary | ICD-10-CM | POA: Diagnosis not present

## 2017-01-01 DIAGNOSIS — D631 Anemia in chronic kidney disease: Secondary | ICD-10-CM | POA: Diagnosis not present

## 2017-01-01 DIAGNOSIS — M80051D Age-related osteoporosis with current pathological fracture, right femur, subsequent encounter for fracture with routine healing: Secondary | ICD-10-CM | POA: Diagnosis not present

## 2017-01-01 DIAGNOSIS — N183 Chronic kidney disease, stage 3 (moderate): Secondary | ICD-10-CM | POA: Diagnosis not present

## 2017-01-01 DIAGNOSIS — M5136 Other intervertebral disc degeneration, lumbar region: Secondary | ICD-10-CM | POA: Diagnosis not present

## 2017-01-01 DIAGNOSIS — F0390 Unspecified dementia without behavioral disturbance: Secondary | ICD-10-CM | POA: Diagnosis not present

## 2017-01-01 DIAGNOSIS — I129 Hypertensive chronic kidney disease with stage 1 through stage 4 chronic kidney disease, or unspecified chronic kidney disease: Secondary | ICD-10-CM | POA: Diagnosis not present

## 2017-01-06 DIAGNOSIS — M80051D Age-related osteoporosis with current pathological fracture, right femur, subsequent encounter for fracture with routine healing: Secondary | ICD-10-CM | POA: Diagnosis not present

## 2017-01-06 DIAGNOSIS — F0390 Unspecified dementia without behavioral disturbance: Secondary | ICD-10-CM | POA: Diagnosis not present

## 2017-01-06 DIAGNOSIS — I129 Hypertensive chronic kidney disease with stage 1 through stage 4 chronic kidney disease, or unspecified chronic kidney disease: Secondary | ICD-10-CM | POA: Diagnosis not present

## 2017-01-06 DIAGNOSIS — D631 Anemia in chronic kidney disease: Secondary | ICD-10-CM | POA: Diagnosis not present

## 2017-01-06 DIAGNOSIS — N183 Chronic kidney disease, stage 3 (moderate): Secondary | ICD-10-CM | POA: Diagnosis not present

## 2017-01-06 DIAGNOSIS — M5136 Other intervertebral disc degeneration, lumbar region: Secondary | ICD-10-CM | POA: Diagnosis not present

## 2017-01-07 DIAGNOSIS — M62838 Other muscle spasm: Secondary | ICD-10-CM | POA: Diagnosis not present

## 2017-01-07 DIAGNOSIS — R262 Difficulty in walking, not elsewhere classified: Secondary | ICD-10-CM | POA: Diagnosis not present

## 2017-01-07 DIAGNOSIS — G2581 Restless legs syndrome: Secondary | ICD-10-CM | POA: Diagnosis not present

## 2017-01-07 DIAGNOSIS — R413 Other amnesia: Secondary | ICD-10-CM | POA: Diagnosis not present

## 2017-01-07 DIAGNOSIS — R251 Tremor, unspecified: Secondary | ICD-10-CM | POA: Diagnosis not present

## 2017-01-07 DIAGNOSIS — G5600 Carpal tunnel syndrome, unspecified upper limb: Secondary | ICD-10-CM | POA: Diagnosis not present

## 2017-01-08 DIAGNOSIS — M80051D Age-related osteoporosis with current pathological fracture, right femur, subsequent encounter for fracture with routine healing: Secondary | ICD-10-CM | POA: Diagnosis not present

## 2017-01-08 DIAGNOSIS — N183 Chronic kidney disease, stage 3 (moderate): Secondary | ICD-10-CM | POA: Diagnosis not present

## 2017-01-08 DIAGNOSIS — I129 Hypertensive chronic kidney disease with stage 1 through stage 4 chronic kidney disease, or unspecified chronic kidney disease: Secondary | ICD-10-CM | POA: Diagnosis not present

## 2017-01-08 DIAGNOSIS — F0390 Unspecified dementia without behavioral disturbance: Secondary | ICD-10-CM | POA: Diagnosis not present

## 2017-01-08 DIAGNOSIS — D631 Anemia in chronic kidney disease: Secondary | ICD-10-CM | POA: Diagnosis not present

## 2017-01-08 DIAGNOSIS — M5136 Other intervertebral disc degeneration, lumbar region: Secondary | ICD-10-CM | POA: Diagnosis not present

## 2017-01-09 DIAGNOSIS — N183 Chronic kidney disease, stage 3 (moderate): Secondary | ICD-10-CM | POA: Diagnosis not present

## 2017-01-09 DIAGNOSIS — D631 Anemia in chronic kidney disease: Secondary | ICD-10-CM | POA: Diagnosis not present

## 2017-01-09 DIAGNOSIS — I129 Hypertensive chronic kidney disease with stage 1 through stage 4 chronic kidney disease, or unspecified chronic kidney disease: Secondary | ICD-10-CM | POA: Diagnosis not present

## 2017-01-09 DIAGNOSIS — M80051D Age-related osteoporosis with current pathological fracture, right femur, subsequent encounter for fracture with routine healing: Secondary | ICD-10-CM | POA: Diagnosis not present

## 2017-01-09 DIAGNOSIS — F0390 Unspecified dementia without behavioral disturbance: Secondary | ICD-10-CM | POA: Diagnosis not present

## 2017-01-09 DIAGNOSIS — M5136 Other intervertebral disc degeneration, lumbar region: Secondary | ICD-10-CM | POA: Diagnosis not present

## 2017-01-13 DIAGNOSIS — G5603 Carpal tunnel syndrome, bilateral upper limbs: Secondary | ICD-10-CM | POA: Diagnosis not present

## 2017-01-13 DIAGNOSIS — M80051D Age-related osteoporosis with current pathological fracture, right femur, subsequent encounter for fracture with routine healing: Secondary | ICD-10-CM | POA: Diagnosis not present

## 2017-01-13 DIAGNOSIS — I129 Hypertensive chronic kidney disease with stage 1 through stage 4 chronic kidney disease, or unspecified chronic kidney disease: Secondary | ICD-10-CM | POA: Diagnosis not present

## 2017-01-13 DIAGNOSIS — N183 Chronic kidney disease, stage 3 (moderate): Secondary | ICD-10-CM | POA: Diagnosis not present

## 2017-01-13 DIAGNOSIS — F0390 Unspecified dementia without behavioral disturbance: Secondary | ICD-10-CM | POA: Diagnosis not present

## 2017-01-13 DIAGNOSIS — M5136 Other intervertebral disc degeneration, lumbar region: Secondary | ICD-10-CM | POA: Diagnosis not present

## 2017-01-13 DIAGNOSIS — D631 Anemia in chronic kidney disease: Secondary | ICD-10-CM | POA: Diagnosis not present

## 2017-01-13 DIAGNOSIS — M8000XA Age-related osteoporosis with current pathological fracture, unspecified site, initial encounter for fracture: Secondary | ICD-10-CM | POA: Diagnosis not present

## 2017-01-14 DIAGNOSIS — F0390 Unspecified dementia without behavioral disturbance: Secondary | ICD-10-CM | POA: Diagnosis not present

## 2017-01-14 DIAGNOSIS — M5136 Other intervertebral disc degeneration, lumbar region: Secondary | ICD-10-CM | POA: Diagnosis not present

## 2017-01-14 DIAGNOSIS — I129 Hypertensive chronic kidney disease with stage 1 through stage 4 chronic kidney disease, or unspecified chronic kidney disease: Secondary | ICD-10-CM | POA: Diagnosis not present

## 2017-01-14 DIAGNOSIS — N183 Chronic kidney disease, stage 3 (moderate): Secondary | ICD-10-CM | POA: Diagnosis not present

## 2017-01-14 DIAGNOSIS — D631 Anemia in chronic kidney disease: Secondary | ICD-10-CM | POA: Diagnosis not present

## 2017-01-14 DIAGNOSIS — M80051D Age-related osteoporosis with current pathological fracture, right femur, subsequent encounter for fracture with routine healing: Secondary | ICD-10-CM | POA: Diagnosis not present

## 2017-01-15 DIAGNOSIS — M5136 Other intervertebral disc degeneration, lumbar region: Secondary | ICD-10-CM | POA: Diagnosis not present

## 2017-01-15 DIAGNOSIS — M80051D Age-related osteoporosis with current pathological fracture, right femur, subsequent encounter for fracture with routine healing: Secondary | ICD-10-CM | POA: Diagnosis not present

## 2017-01-15 DIAGNOSIS — N183 Chronic kidney disease, stage 3 (moderate): Secondary | ICD-10-CM | POA: Diagnosis not present

## 2017-01-15 DIAGNOSIS — D631 Anemia in chronic kidney disease: Secondary | ICD-10-CM | POA: Diagnosis not present

## 2017-01-15 DIAGNOSIS — I129 Hypertensive chronic kidney disease with stage 1 through stage 4 chronic kidney disease, or unspecified chronic kidney disease: Secondary | ICD-10-CM | POA: Diagnosis not present

## 2017-01-15 DIAGNOSIS — F0390 Unspecified dementia without behavioral disturbance: Secondary | ICD-10-CM | POA: Diagnosis not present

## 2017-01-16 DIAGNOSIS — I129 Hypertensive chronic kidney disease with stage 1 through stage 4 chronic kidney disease, or unspecified chronic kidney disease: Secondary | ICD-10-CM | POA: Diagnosis not present

## 2017-01-16 DIAGNOSIS — F0390 Unspecified dementia without behavioral disturbance: Secondary | ICD-10-CM | POA: Diagnosis not present

## 2017-01-16 DIAGNOSIS — D631 Anemia in chronic kidney disease: Secondary | ICD-10-CM | POA: Diagnosis not present

## 2017-01-16 DIAGNOSIS — M5136 Other intervertebral disc degeneration, lumbar region: Secondary | ICD-10-CM | POA: Diagnosis not present

## 2017-01-16 DIAGNOSIS — M80051D Age-related osteoporosis with current pathological fracture, right femur, subsequent encounter for fracture with routine healing: Secondary | ICD-10-CM | POA: Diagnosis not present

## 2017-01-16 DIAGNOSIS — N183 Chronic kidney disease, stage 3 (moderate): Secondary | ICD-10-CM | POA: Diagnosis not present

## 2017-01-20 DIAGNOSIS — D631 Anemia in chronic kidney disease: Secondary | ICD-10-CM | POA: Diagnosis not present

## 2017-01-20 DIAGNOSIS — I129 Hypertensive chronic kidney disease with stage 1 through stage 4 chronic kidney disease, or unspecified chronic kidney disease: Secondary | ICD-10-CM | POA: Diagnosis not present

## 2017-01-20 DIAGNOSIS — M80051D Age-related osteoporosis with current pathological fracture, right femur, subsequent encounter for fracture with routine healing: Secondary | ICD-10-CM | POA: Diagnosis not present

## 2017-01-20 DIAGNOSIS — N183 Chronic kidney disease, stage 3 (moderate): Secondary | ICD-10-CM | POA: Diagnosis not present

## 2017-01-20 DIAGNOSIS — F0390 Unspecified dementia without behavioral disturbance: Secondary | ICD-10-CM | POA: Diagnosis not present

## 2017-01-20 DIAGNOSIS — M5136 Other intervertebral disc degeneration, lumbar region: Secondary | ICD-10-CM | POA: Diagnosis not present

## 2017-01-21 DIAGNOSIS — M80051D Age-related osteoporosis with current pathological fracture, right femur, subsequent encounter for fracture with routine healing: Secondary | ICD-10-CM | POA: Diagnosis not present

## 2017-01-21 DIAGNOSIS — N183 Chronic kidney disease, stage 3 (moderate): Secondary | ICD-10-CM | POA: Diagnosis not present

## 2017-01-21 DIAGNOSIS — I129 Hypertensive chronic kidney disease with stage 1 through stage 4 chronic kidney disease, or unspecified chronic kidney disease: Secondary | ICD-10-CM | POA: Diagnosis not present

## 2017-01-21 DIAGNOSIS — D631 Anemia in chronic kidney disease: Secondary | ICD-10-CM | POA: Diagnosis not present

## 2017-01-21 DIAGNOSIS — M5136 Other intervertebral disc degeneration, lumbar region: Secondary | ICD-10-CM | POA: Diagnosis not present

## 2017-01-21 DIAGNOSIS — F0390 Unspecified dementia without behavioral disturbance: Secondary | ICD-10-CM | POA: Diagnosis not present

## 2017-01-24 DIAGNOSIS — M80051D Age-related osteoporosis with current pathological fracture, right femur, subsequent encounter for fracture with routine healing: Secondary | ICD-10-CM | POA: Diagnosis not present

## 2017-01-24 DIAGNOSIS — F0390 Unspecified dementia without behavioral disturbance: Secondary | ICD-10-CM | POA: Diagnosis not present

## 2017-01-24 DIAGNOSIS — N183 Chronic kidney disease, stage 3 (moderate): Secondary | ICD-10-CM | POA: Diagnosis not present

## 2017-01-24 DIAGNOSIS — I129 Hypertensive chronic kidney disease with stage 1 through stage 4 chronic kidney disease, or unspecified chronic kidney disease: Secondary | ICD-10-CM | POA: Diagnosis not present

## 2017-01-24 DIAGNOSIS — M5136 Other intervertebral disc degeneration, lumbar region: Secondary | ICD-10-CM | POA: Diagnosis not present

## 2017-01-24 DIAGNOSIS — D631 Anemia in chronic kidney disease: Secondary | ICD-10-CM | POA: Diagnosis not present

## 2017-01-27 DIAGNOSIS — D631 Anemia in chronic kidney disease: Secondary | ICD-10-CM | POA: Diagnosis not present

## 2017-01-27 DIAGNOSIS — I129 Hypertensive chronic kidney disease with stage 1 through stage 4 chronic kidney disease, or unspecified chronic kidney disease: Secondary | ICD-10-CM | POA: Diagnosis not present

## 2017-01-27 DIAGNOSIS — N183 Chronic kidney disease, stage 3 (moderate): Secondary | ICD-10-CM | POA: Diagnosis not present

## 2017-01-27 DIAGNOSIS — M5136 Other intervertebral disc degeneration, lumbar region: Secondary | ICD-10-CM | POA: Diagnosis not present

## 2017-01-27 DIAGNOSIS — F0390 Unspecified dementia without behavioral disturbance: Secondary | ICD-10-CM | POA: Diagnosis not present

## 2017-01-27 DIAGNOSIS — M80051D Age-related osteoporosis with current pathological fracture, right femur, subsequent encounter for fracture with routine healing: Secondary | ICD-10-CM | POA: Diagnosis not present

## 2017-01-28 DIAGNOSIS — N183 Chronic kidney disease, stage 3 (moderate): Secondary | ICD-10-CM | POA: Diagnosis not present

## 2017-01-28 DIAGNOSIS — F0390 Unspecified dementia without behavioral disturbance: Secondary | ICD-10-CM | POA: Diagnosis not present

## 2017-01-28 DIAGNOSIS — D631 Anemia in chronic kidney disease: Secondary | ICD-10-CM | POA: Diagnosis not present

## 2017-01-28 DIAGNOSIS — M5136 Other intervertebral disc degeneration, lumbar region: Secondary | ICD-10-CM | POA: Diagnosis not present

## 2017-01-28 DIAGNOSIS — M80051D Age-related osteoporosis with current pathological fracture, right femur, subsequent encounter for fracture with routine healing: Secondary | ICD-10-CM | POA: Diagnosis not present

## 2017-01-28 DIAGNOSIS — I129 Hypertensive chronic kidney disease with stage 1 through stage 4 chronic kidney disease, or unspecified chronic kidney disease: Secondary | ICD-10-CM | POA: Diagnosis not present

## 2017-01-29 DIAGNOSIS — D631 Anemia in chronic kidney disease: Secondary | ICD-10-CM | POA: Diagnosis not present

## 2017-01-29 DIAGNOSIS — I129 Hypertensive chronic kidney disease with stage 1 through stage 4 chronic kidney disease, or unspecified chronic kidney disease: Secondary | ICD-10-CM | POA: Diagnosis not present

## 2017-01-29 DIAGNOSIS — M80051D Age-related osteoporosis with current pathological fracture, right femur, subsequent encounter for fracture with routine healing: Secondary | ICD-10-CM | POA: Diagnosis not present

## 2017-01-29 DIAGNOSIS — M5136 Other intervertebral disc degeneration, lumbar region: Secondary | ICD-10-CM | POA: Diagnosis not present

## 2017-01-29 DIAGNOSIS — F0390 Unspecified dementia without behavioral disturbance: Secondary | ICD-10-CM | POA: Diagnosis not present

## 2017-01-29 DIAGNOSIS — N183 Chronic kidney disease, stage 3 (moderate): Secondary | ICD-10-CM | POA: Diagnosis not present

## 2017-02-04 DIAGNOSIS — M5136 Other intervertebral disc degeneration, lumbar region: Secondary | ICD-10-CM | POA: Diagnosis not present

## 2017-02-04 DIAGNOSIS — F0390 Unspecified dementia without behavioral disturbance: Secondary | ICD-10-CM | POA: Diagnosis not present

## 2017-02-04 DIAGNOSIS — N183 Chronic kidney disease, stage 3 (moderate): Secondary | ICD-10-CM | POA: Diagnosis not present

## 2017-02-04 DIAGNOSIS — D631 Anemia in chronic kidney disease: Secondary | ICD-10-CM | POA: Diagnosis not present

## 2017-02-04 DIAGNOSIS — M80051D Age-related osteoporosis with current pathological fracture, right femur, subsequent encounter for fracture with routine healing: Secondary | ICD-10-CM | POA: Diagnosis not present

## 2017-02-04 DIAGNOSIS — I129 Hypertensive chronic kidney disease with stage 1 through stage 4 chronic kidney disease, or unspecified chronic kidney disease: Secondary | ICD-10-CM | POA: Diagnosis not present

## 2017-02-05 DIAGNOSIS — D631 Anemia in chronic kidney disease: Secondary | ICD-10-CM | POA: Diagnosis not present

## 2017-02-05 DIAGNOSIS — M5136 Other intervertebral disc degeneration, lumbar region: Secondary | ICD-10-CM | POA: Diagnosis not present

## 2017-02-05 DIAGNOSIS — I129 Hypertensive chronic kidney disease with stage 1 through stage 4 chronic kidney disease, or unspecified chronic kidney disease: Secondary | ICD-10-CM | POA: Diagnosis not present

## 2017-02-05 DIAGNOSIS — N183 Chronic kidney disease, stage 3 (moderate): Secondary | ICD-10-CM | POA: Diagnosis not present

## 2017-02-05 DIAGNOSIS — M80051D Age-related osteoporosis with current pathological fracture, right femur, subsequent encounter for fracture with routine healing: Secondary | ICD-10-CM | POA: Diagnosis not present

## 2017-02-05 DIAGNOSIS — F0390 Unspecified dementia without behavioral disturbance: Secondary | ICD-10-CM | POA: Diagnosis not present

## 2017-02-07 DIAGNOSIS — G4733 Obstructive sleep apnea (adult) (pediatric): Secondary | ICD-10-CM | POA: Diagnosis not present

## 2017-02-12 DIAGNOSIS — M5136 Other intervertebral disc degeneration, lumbar region: Secondary | ICD-10-CM | POA: Diagnosis not present

## 2017-02-12 DIAGNOSIS — M80051D Age-related osteoporosis with current pathological fracture, right femur, subsequent encounter for fracture with routine healing: Secondary | ICD-10-CM | POA: Diagnosis not present

## 2017-02-12 DIAGNOSIS — D631 Anemia in chronic kidney disease: Secondary | ICD-10-CM | POA: Diagnosis not present

## 2017-02-12 DIAGNOSIS — G5603 Carpal tunnel syndrome, bilateral upper limbs: Secondary | ICD-10-CM | POA: Diagnosis not present

## 2017-02-12 DIAGNOSIS — M8000XA Age-related osteoporosis with current pathological fracture, unspecified site, initial encounter for fracture: Secondary | ICD-10-CM | POA: Diagnosis not present

## 2017-02-12 DIAGNOSIS — F0390 Unspecified dementia without behavioral disturbance: Secondary | ICD-10-CM | POA: Diagnosis not present

## 2017-02-12 DIAGNOSIS — G4733 Obstructive sleep apnea (adult) (pediatric): Secondary | ICD-10-CM | POA: Diagnosis not present

## 2017-02-12 DIAGNOSIS — N183 Chronic kidney disease, stage 3 (moderate): Secondary | ICD-10-CM | POA: Diagnosis not present

## 2017-02-12 DIAGNOSIS — I129 Hypertensive chronic kidney disease with stage 1 through stage 4 chronic kidney disease, or unspecified chronic kidney disease: Secondary | ICD-10-CM | POA: Diagnosis not present

## 2017-02-13 DIAGNOSIS — M5136 Other intervertebral disc degeneration, lumbar region: Secondary | ICD-10-CM | POA: Diagnosis not present

## 2017-02-13 DIAGNOSIS — D631 Anemia in chronic kidney disease: Secondary | ICD-10-CM | POA: Diagnosis not present

## 2017-02-13 DIAGNOSIS — M80051D Age-related osteoporosis with current pathological fracture, right femur, subsequent encounter for fracture with routine healing: Secondary | ICD-10-CM | POA: Diagnosis not present

## 2017-02-13 DIAGNOSIS — F0390 Unspecified dementia without behavioral disturbance: Secondary | ICD-10-CM | POA: Diagnosis not present

## 2017-02-13 DIAGNOSIS — I129 Hypertensive chronic kidney disease with stage 1 through stage 4 chronic kidney disease, or unspecified chronic kidney disease: Secondary | ICD-10-CM | POA: Diagnosis not present

## 2017-02-13 DIAGNOSIS — N183 Chronic kidney disease, stage 3 (moderate): Secondary | ICD-10-CM | POA: Diagnosis not present

## 2017-02-19 DIAGNOSIS — R351 Nocturia: Secondary | ICD-10-CM | POA: Diagnosis not present

## 2017-03-11 DIAGNOSIS — D649 Anemia, unspecified: Secondary | ICD-10-CM | POA: Diagnosis not present

## 2017-03-11 DIAGNOSIS — N183 Chronic kidney disease, stage 3 (moderate): Secondary | ICD-10-CM | POA: Diagnosis not present

## 2017-03-11 DIAGNOSIS — I10 Essential (primary) hypertension: Secondary | ICD-10-CM | POA: Diagnosis not present

## 2017-03-11 DIAGNOSIS — M542 Cervicalgia: Secondary | ICD-10-CM | POA: Diagnosis not present

## 2017-03-11 DIAGNOSIS — M5136 Other intervertebral disc degeneration, lumbar region: Secondary | ICD-10-CM | POA: Diagnosis not present

## 2017-03-11 DIAGNOSIS — E7849 Other hyperlipidemia: Secondary | ICD-10-CM | POA: Diagnosis not present

## 2017-03-11 DIAGNOSIS — F0390 Unspecified dementia without behavioral disturbance: Secondary | ICD-10-CM | POA: Diagnosis not present

## 2017-03-11 DIAGNOSIS — M50323 Other cervical disc degeneration at C6-C7 level: Secondary | ICD-10-CM | POA: Diagnosis not present

## 2017-03-11 DIAGNOSIS — G4733 Obstructive sleep apnea (adult) (pediatric): Secondary | ICD-10-CM | POA: Diagnosis not present

## 2017-03-11 DIAGNOSIS — M8000XS Age-related osteoporosis with current pathological fracture, unspecified site, sequela: Secondary | ICD-10-CM | POA: Diagnosis not present

## 2017-03-11 DIAGNOSIS — E034 Atrophy of thyroid (acquired): Secondary | ICD-10-CM | POA: Diagnosis not present

## 2017-03-11 DIAGNOSIS — M50322 Other cervical disc degeneration at C5-C6 level: Secondary | ICD-10-CM | POA: Diagnosis not present

## 2017-03-12 DIAGNOSIS — H0102A Squamous blepharitis right eye, upper and lower eyelids: Secondary | ICD-10-CM | POA: Diagnosis not present

## 2017-03-15 DIAGNOSIS — M5136 Other intervertebral disc degeneration, lumbar region: Secondary | ICD-10-CM | POA: Diagnosis not present

## 2017-03-15 DIAGNOSIS — M8000XA Age-related osteoporosis with current pathological fracture, unspecified site, initial encounter for fracture: Secondary | ICD-10-CM | POA: Diagnosis not present

## 2017-03-15 DIAGNOSIS — G5603 Carpal tunnel syndrome, bilateral upper limbs: Secondary | ICD-10-CM | POA: Diagnosis not present

## 2017-03-18 DIAGNOSIS — S72141D Displaced intertrochanteric fracture of right femur, subsequent encounter for closed fracture with routine healing: Secondary | ICD-10-CM | POA: Diagnosis not present

## 2017-03-28 DIAGNOSIS — H0102A Squamous blepharitis right eye, upper and lower eyelids: Secondary | ICD-10-CM | POA: Diagnosis not present

## 2017-04-08 DIAGNOSIS — E034 Atrophy of thyroid (acquired): Secondary | ICD-10-CM | POA: Diagnosis not present

## 2017-04-08 DIAGNOSIS — I10 Essential (primary) hypertension: Secondary | ICD-10-CM | POA: Diagnosis not present

## 2017-04-08 DIAGNOSIS — F0390 Unspecified dementia without behavioral disturbance: Secondary | ICD-10-CM | POA: Diagnosis not present

## 2017-04-08 DIAGNOSIS — N183 Chronic kidney disease, stage 3 (moderate): Secondary | ICD-10-CM | POA: Diagnosis not present

## 2017-04-08 DIAGNOSIS — G4733 Obstructive sleep apnea (adult) (pediatric): Secondary | ICD-10-CM | POA: Diagnosis not present

## 2017-04-08 DIAGNOSIS — M8000XS Age-related osteoporosis with current pathological fracture, unspecified site, sequela: Secondary | ICD-10-CM | POA: Diagnosis not present

## 2017-04-08 DIAGNOSIS — E7849 Other hyperlipidemia: Secondary | ICD-10-CM | POA: Diagnosis not present

## 2017-04-08 DIAGNOSIS — D649 Anemia, unspecified: Secondary | ICD-10-CM | POA: Diagnosis not present

## 2017-04-08 DIAGNOSIS — M5136 Other intervertebral disc degeneration, lumbar region: Secondary | ICD-10-CM | POA: Diagnosis not present

## 2017-04-14 DIAGNOSIS — G4733 Obstructive sleep apnea (adult) (pediatric): Secondary | ICD-10-CM | POA: Diagnosis not present

## 2017-04-15 DIAGNOSIS — M5136 Other intervertebral disc degeneration, lumbar region: Secondary | ICD-10-CM | POA: Diagnosis not present

## 2017-04-15 DIAGNOSIS — M8000XA Age-related osteoporosis with current pathological fracture, unspecified site, initial encounter for fracture: Secondary | ICD-10-CM | POA: Diagnosis not present

## 2017-04-15 DIAGNOSIS — G5603 Carpal tunnel syndrome, bilateral upper limbs: Secondary | ICD-10-CM | POA: Diagnosis not present

## 2017-05-12 DIAGNOSIS — G4733 Obstructive sleep apnea (adult) (pediatric): Secondary | ICD-10-CM | POA: Diagnosis not present

## 2017-05-13 DIAGNOSIS — M8000XA Age-related osteoporosis with current pathological fracture, unspecified site, initial encounter for fracture: Secondary | ICD-10-CM | POA: Diagnosis not present

## 2017-05-13 DIAGNOSIS — M5136 Other intervertebral disc degeneration, lumbar region: Secondary | ICD-10-CM | POA: Diagnosis not present

## 2017-05-13 DIAGNOSIS — G5603 Carpal tunnel syndrome, bilateral upper limbs: Secondary | ICD-10-CM | POA: Diagnosis not present

## 2017-05-20 DIAGNOSIS — E034 Atrophy of thyroid (acquired): Secondary | ICD-10-CM | POA: Diagnosis not present

## 2017-05-20 DIAGNOSIS — N183 Chronic kidney disease, stage 3 (moderate): Secondary | ICD-10-CM | POA: Diagnosis not present

## 2017-05-20 DIAGNOSIS — M5136 Other intervertebral disc degeneration, lumbar region: Secondary | ICD-10-CM | POA: Diagnosis not present

## 2017-05-20 DIAGNOSIS — G4733 Obstructive sleep apnea (adult) (pediatric): Secondary | ICD-10-CM | POA: Diagnosis not present

## 2017-05-20 DIAGNOSIS — I10 Essential (primary) hypertension: Secondary | ICD-10-CM | POA: Diagnosis not present

## 2017-05-20 DIAGNOSIS — F0390 Unspecified dementia without behavioral disturbance: Secondary | ICD-10-CM | POA: Diagnosis not present

## 2017-05-20 DIAGNOSIS — D649 Anemia, unspecified: Secondary | ICD-10-CM | POA: Diagnosis not present

## 2017-05-20 DIAGNOSIS — E7849 Other hyperlipidemia: Secondary | ICD-10-CM | POA: Diagnosis not present

## 2017-05-20 DIAGNOSIS — M8000XS Age-related osteoporosis with current pathological fracture, unspecified site, sequela: Secondary | ICD-10-CM | POA: Diagnosis not present

## 2017-05-27 ENCOUNTER — Other Ambulatory Visit: Payer: Self-pay | Admitting: Family Medicine

## 2017-05-27 DIAGNOSIS — M5030 Other cervical disc degeneration, unspecified cervical region: Secondary | ICD-10-CM | POA: Diagnosis not present

## 2017-05-27 DIAGNOSIS — M5412 Radiculopathy, cervical region: Secondary | ICD-10-CM | POA: Diagnosis not present

## 2017-05-27 DIAGNOSIS — M542 Cervicalgia: Secondary | ICD-10-CM | POA: Diagnosis not present

## 2017-05-28 ENCOUNTER — Emergency Department: Payer: Medicare HMO

## 2017-05-28 ENCOUNTER — Emergency Department
Admission: EM | Admit: 2017-05-28 | Discharge: 2017-05-28 | Disposition: A | Discharge status: Home / Self Care | Payer: Medicare HMO | Attending: Emergency Medicine | Admitting: Emergency Medicine

## 2017-05-28 ENCOUNTER — Encounter: Payer: Self-pay | Admitting: Emergency Medicine

## 2017-05-28 ENCOUNTER — Other Ambulatory Visit: Payer: Self-pay

## 2017-05-28 DIAGNOSIS — E039 Hypothyroidism, unspecified: Secondary | ICD-10-CM | POA: Insufficient documentation

## 2017-05-28 DIAGNOSIS — Y939 Activity, unspecified: Secondary | ICD-10-CM | POA: Insufficient documentation

## 2017-05-28 DIAGNOSIS — W19XXXA Unspecified fall, initial encounter: Secondary | ICD-10-CM | POA: Insufficient documentation

## 2017-05-28 DIAGNOSIS — M25552 Pain in left hip: Secondary | ICD-10-CM | POA: Insufficient documentation

## 2017-05-28 DIAGNOSIS — S79912A Unspecified injury of left hip, initial encounter: Secondary | ICD-10-CM | POA: Diagnosis not present

## 2017-05-28 DIAGNOSIS — Y999 Unspecified external cause status: Secondary | ICD-10-CM | POA: Insufficient documentation

## 2017-05-28 DIAGNOSIS — I129 Hypertensive chronic kidney disease with stage 1 through stage 4 chronic kidney disease, or unspecified chronic kidney disease: Secondary | ICD-10-CM | POA: Insufficient documentation

## 2017-05-28 DIAGNOSIS — M25559 Pain in unspecified hip: Secondary | ICD-10-CM | POA: Diagnosis not present

## 2017-05-28 DIAGNOSIS — R4182 Altered mental status, unspecified: Secondary | ICD-10-CM | POA: Diagnosis not present

## 2017-05-28 DIAGNOSIS — Y9210 Unspecified residential institution as the place of occurrence of the external cause: Secondary | ICD-10-CM | POA: Diagnosis not present

## 2017-05-28 DIAGNOSIS — N183 Chronic kidney disease, stage 3 (moderate): Secondary | ICD-10-CM | POA: Insufficient documentation

## 2017-05-28 DIAGNOSIS — M545 Low back pain: Secondary | ICD-10-CM | POA: Diagnosis not present

## 2017-05-28 DIAGNOSIS — S3992XA Unspecified injury of lower back, initial encounter: Secondary | ICD-10-CM | POA: Diagnosis not present

## 2017-05-28 DIAGNOSIS — F0390 Unspecified dementia without behavioral disturbance: Secondary | ICD-10-CM | POA: Diagnosis not present

## 2017-05-28 DIAGNOSIS — W1830XA Fall on same level, unspecified, initial encounter: Secondary | ICD-10-CM | POA: Diagnosis not present

## 2017-05-28 LAB — CBC WITH DIFFERENTIAL/PLATELET
BASOS ABS: 0.1 10*3/uL (ref 0–0.1)
BASOS PCT: 1 %
EOS PCT: 2 %
Eosinophils Absolute: 0.2 10*3/uL (ref 0–0.7)
HCT: 34.4 % — ABNORMAL LOW (ref 35.0–47.0)
Hemoglobin: 11.5 g/dL — ABNORMAL LOW (ref 12.0–16.0)
LYMPHS PCT: 38 %
Lymphs Abs: 2.8 10*3/uL (ref 1.0–3.6)
MCH: 31.9 pg (ref 26.0–34.0)
MCHC: 33.4 g/dL (ref 32.0–36.0)
MCV: 95.6 fL (ref 80.0–100.0)
MONO ABS: 0.7 10*3/uL (ref 0.2–0.9)
Monocytes Relative: 9 %
Neutro Abs: 3.7 10*3/uL (ref 1.4–6.5)
Neutrophils Relative %: 50 %
PLATELETS: 220 10*3/uL (ref 150–440)
RBC: 3.6 MIL/uL — AB (ref 3.80–5.20)
RDW: 13.2 % (ref 11.5–14.5)
WBC: 7.5 10*3/uL (ref 3.6–11.0)

## 2017-05-28 LAB — COMPREHENSIVE METABOLIC PANEL
ALT: 16 U/L (ref 14–54)
AST: 28 U/L (ref 15–41)
Albumin: 3.9 g/dL (ref 3.5–5.0)
Alkaline Phosphatase: 92 U/L (ref 38–126)
Anion gap: 9 (ref 5–15)
BUN: 22 mg/dL — AB (ref 6–20)
CHLORIDE: 104 mmol/L (ref 101–111)
CO2: 27 mmol/L (ref 22–32)
CREATININE: 1.41 mg/dL — AB (ref 0.44–1.00)
Calcium: 9.6 mg/dL (ref 8.9–10.3)
GFR calc Af Amer: 38 mL/min — ABNORMAL LOW (ref >60)
GFR, EST NON AFRICAN AMERICAN: 33 mL/min — AB (ref >60)
Glucose, Bld: 95 mg/dL (ref 65–99)
POTASSIUM: 4.8 mmol/L (ref 3.5–5.1)
Sodium: 140 mmol/L (ref 135–145)
Total Bilirubin: 0.7 mg/dL (ref 0.3–1.2)
Total Protein: 6.4 g/dL — ABNORMAL LOW (ref 6.5–8.1)

## 2017-05-28 LAB — URINALYSIS, COMPLETE (UACMP) WITH MICROSCOPIC
Bacteria, UA: NONE SEEN
Bilirubin Urine: NEGATIVE
GLUCOSE, UA: NEGATIVE mg/dL
Hgb urine dipstick: NEGATIVE
KETONES UR: NEGATIVE mg/dL
LEUKOCYTES UA: NEGATIVE
Nitrite: NEGATIVE
PROTEIN: NEGATIVE mg/dL
SQUAMOUS EPITHELIAL / LPF: NONE SEEN
Specific Gravity, Urine: 1.01 (ref 1.005–1.030)
pH: 8 (ref 5.0–8.0)

## 2017-05-28 LAB — TROPONIN I

## 2017-05-28 LAB — GLUCOSE, CAPILLARY: GLUCOSE-CAPILLARY: 86 mg/dL (ref 65–99)

## 2017-05-28 NOTE — ED Triage Notes (Signed)
Unwitnessed fall. Found in bathroom.  Hx dementia. Seems to have pain to left hip with movement of left leg

## 2017-05-28 NOTE — ED Notes (Signed)
Patient transported to X-ray 

## 2017-05-28 NOTE — ED Notes (Signed)
Patient walked to bathroom with no problems at this time.

## 2017-05-28 NOTE — ED Notes (Signed)
Pt discharged home after verbalizing understanding of discharge instructions; nad noted. 

## 2017-05-28 NOTE — ED Notes (Signed)
Fall bell placed on pt 

## 2017-05-28 NOTE — ED Provider Notes (Signed)
[facility] Emergency Department Provider Note     Level V caveat: History/ROS limited by altered mental status and/or dementia  Time seen: ----------------------------------------- 8:34 AM on 05/28/2017 -----------------------------------------   I have reviewed the triage vital signs and the nursing notes.  HISTORY   Chief Complaint Fall    HPI Carrie Calhoun is a 84 y.o. female with a history of anemia, anxiety, chronic kidney disease, cardiac arrest, hypertension and hyperlipidemia who presents to the ED for an unwitnessed fall.  Patient arrives from an assisted living facility where she got up to go to the restroom and fell.  She was complaining of left-sided hip and low back pain.  According to the history she has been shaking as well which is not her norm.  She does have a history of dementia.  Past Medical History:  Diagnosis Date  . Anemia, unspecified   . Anxiety   . Atrioventricular block 06/09/2008   1st degree with bradycardia  . Cardiac arrest (HCC)   . Chicken pox   . CKD (chronic kidney disease) stage 3, GFR 30-59 ml/min   . Diverticulosis   . History of cardiac arrest    during prior cataract surgery, thought to be from anesthesia, per pt. Followed by Dr. Darrold Junker  . Hyperlipidemia, unspecified   . Hypertension   . Hypothyroidism, unspecified   . Osteopenia   . Renal disorder     Patient Active Problem List   Diagnosis Date Noted  . DNR (do not resuscitate) 09/12/2016  . Hip fracture (HCC) 09/10/2016  . Osteopenia 09/02/2016  . Hyperlipidemia, unspecified 09/02/2016  . DDD (degenerative disc disease), lumbar 09/02/2016  . CKD (chronic kidney disease), stage III (HCC) 09/02/2016  . Anemia, unspecified 09/02/2016  . Ataxia 07/22/2016  . Dementia arising in the senium and presenium 07/15/2016  . Multiple rib fractures 07/04/2016  . Moderate major depression, single episode (HCC) 06/14/2016  . Difficulty walking 01/08/2016   . Hypothyroidism due to acquired atrophy of thyroid 07/21/2015  . Paresthesia of both hands 06/28/2014  . Bilateral carpal tunnel syndrome 06/28/2014  . Tremor 12/28/2013  . Restless legs 12/28/2013  . Loss of memory 12/28/2013  . Essential hypertension 12/28/2013    Past Surgical History:  Procedure Laterality Date  . APPENDECTOMY  1970  . CATARACT EXTRACTION, BILATERAL    . COLONOSCOPY  04/29/2006  . FOOT SURGERY Bilateral   . INTRAMEDULLARY (IM) NAIL INTERTROCHANTERIC Right 09/10/2016   Procedure: INTRAMEDULLARY (IM) NAIL INTERTROCHANTRIC;  Surgeon: [name], MD;  Location: [facility] ORS;  Service: Orthopedics;  Laterality: Right;  . TUBAL LIGATION Bilateral 1970    Allergies Alendronate; Lidocaine; Phenylephrine; Phenyltoloxamine-acetaminophen; and Azithromycin  Social History Social History   Tobacco Use  . Smoking status: Never Smoker  . Smokeless tobacco: Never Used  Substance Use Topics  . Alcohol use: No  . Drug use: No   Review of Systems Unknown, reported left hip and low back pain, altered mental status  All systems negative/normal/unremarkable except as stated in the HPI  ____________________________________________   PHYSICAL EXAM:  VITAL SIGNS: ED Triage Vitals  Enc Vitals Group     BP      Pulse      Resp      Temp      Temp src      SpO2      Weight      Height      Head Circumference      Peak Flow  Pain Score      Pain Loc      Pain Edu?      Excl. in GC?    Constitutional: Alert but agitated, mild distress and tearful Eyes: Conjunctivae are normal. Normal extraocular movements. ENT   Head: Normocephalic and atraumatic.   Nose: No congestion/rhinnorhea.   Mouth/Throat: Mucous membranes are moist.   Neck: No stridor. Cardiovascular: Normal rate, regular rhythm. No murmurs, rubs, or gallops. Respiratory: Normal respiratory effort without tachypnea nor retractions. Breath sounds are clear and equal bilaterally.  No wheezes/rales/rhonchi. Gastrointestinal: Soft and nontender. Normal bowel sounds Musculoskeletal: Limited range of motion of the extremities, mild edema, pain with palpation over the left hip Neurologic:  Normal speech and language. No gross focal neurologic deficits are appreciated.  Skin:  Skin is warm, dry and intact. No rash noted. Psychiatric: Depressed mood and affect, tearful ____________________________________________  EKG: Interpreted by me.  Atrial fibrillation with a rate of 103 bpm, possible inferior infarct age-indeterminate, leftward axis.  ____________________________________________  ED COURSE:  As part of my medical decision making, I reviewed the following data within the electronic MEDICAL RECORD NUMBER History obtained from family if available, nursing notes, old chart and ekg, as well as notes from prior ED visits. Patient presented for possible altered mental status with an unwitnessed fall, we will assess with labs and imaging as indicated at this time.   Procedures ____________________________________________   LABS (pertinent positives/negatives)  Labs Reviewed  CBC WITH DIFFERENTIAL/PLATELET - Abnormal; Notable for the following components:      Result Value   RBC 3.60 (*)    Hemoglobin 11.5 (*)    HCT 34.4 (*)    All other components within normal limits  COMPREHENSIVE METABOLIC PANEL - Abnormal; Notable for the following components:   BUN 22 (*)    Creatinine, Ser 1.41 (*)    Total Protein 6.4 (*)    GFR calc non Af Amer 33 (*)    GFR calc Af Amer 38 (*)    All other components within normal limits  URINALYSIS, COMPLETE (UACMP) WITH MICROSCOPIC - Abnormal; Notable for the following components:   Color, Urine STRAW (*)    APPearance CLEAR (*)    All other components within normal limits  TROPONIN I  GLUCOSE, CAPILLARY  CBG MONITORING, ED    RADIOLOGY Images were viewed by me  Left hip x-rays, lumbar spine x-rays IMPRESSION: Chronic moderate  degenerative disc disease and facet joint hypertrophy of the lumbar spine. No acute compression fracture. Chronic reverse S shaped thoracolumbar scoliosis. IMPRESSION: 1. No acute osseous abnormality. If occult hip fracture is suspected or if the patient is unable to bear weight, MRI is the preferred modality for further evaluation.  ____________________________________________  DIFFERENTIAL DIAGNOSIS   Dementia, depression, contusion, fracture, occult infection  FINAL ASSESSMENT AND PLAN  Fall, left hip pain, low back pain   Plan: The patient had presented for an unwitnessed fall. Patient's labs are unremarkable. Patient's imaging did not reveal any acute process.  She was able to ambulate and is cleared to return to the nursing home.   Ulice DashJohnathan E Danice Dippolito, MD   Note: This note was generated in part or whole with voice recognition software. Voice recognition is usually quite accurate but there are transcription errors that can and very often do occur. I apologize for any typographical errors that were not detected and corrected.     Emily FilbertWilliams, Raja Caputi E, MD 05/28/17 [id number]

## 2017-05-28 NOTE — ED Notes (Signed)
Carrie Calhoun at bedside to ambulate pt at this time.

## 2017-06-05 ENCOUNTER — Ambulatory Visit
Admission: RE | Admit: 2017-06-05 | Discharge: 2017-06-05 | Disposition: A | Discharge status: Home / Self Care | Payer: Medicare HMO | Source: Ambulatory Visit | Attending: Family Medicine | Admitting: Family Medicine

## 2017-06-05 DIAGNOSIS — M542 Cervicalgia: Secondary | ICD-10-CM | POA: Diagnosis not present

## 2017-06-05 DIAGNOSIS — M4802 Spinal stenosis, cervical region: Secondary | ICD-10-CM | POA: Insufficient documentation

## 2017-06-05 DIAGNOSIS — M50123 Cervical disc disorder at C6-C7 level with radiculopathy: Secondary | ICD-10-CM | POA: Insufficient documentation

## 2017-06-05 DIAGNOSIS — M2578 Osteophyte, vertebrae: Secondary | ICD-10-CM | POA: Diagnosis not present

## 2017-06-05 DIAGNOSIS — M5412 Radiculopathy, cervical region: Secondary | ICD-10-CM

## 2017-06-05 DIAGNOSIS — M4722 Other spondylosis with radiculopathy, cervical region: Secondary | ICD-10-CM | POA: Insufficient documentation

## 2017-06-05 DIAGNOSIS — M5010 Cervical disc disorder with radiculopathy, unspecified cervical region: Secondary | ICD-10-CM | POA: Diagnosis not present

## 2017-06-13 DIAGNOSIS — M5136 Other intervertebral disc degeneration, lumbar region: Secondary | ICD-10-CM | POA: Diagnosis not present

## 2017-06-13 DIAGNOSIS — M8000XA Age-related osteoporosis with current pathological fracture, unspecified site, initial encounter for fracture: Secondary | ICD-10-CM | POA: Diagnosis not present

## 2017-06-13 DIAGNOSIS — G5603 Carpal tunnel syndrome, bilateral upper limbs: Secondary | ICD-10-CM | POA: Diagnosis not present

## 2017-07-07 DIAGNOSIS — F0390 Unspecified dementia without behavioral disturbance: Secondary | ICD-10-CM | POA: Diagnosis not present

## 2017-07-07 DIAGNOSIS — F419 Anxiety disorder, unspecified: Secondary | ICD-10-CM | POA: Diagnosis not present

## 2017-07-07 DIAGNOSIS — I129 Hypertensive chronic kidney disease with stage 1 through stage 4 chronic kidney disease, or unspecified chronic kidney disease: Secondary | ICD-10-CM | POA: Diagnosis not present

## 2017-07-07 DIAGNOSIS — I440 Atrioventricular block, first degree: Secondary | ICD-10-CM | POA: Diagnosis not present

## 2017-07-07 DIAGNOSIS — N183 Chronic kidney disease, stage 3 (moderate): Secondary | ICD-10-CM | POA: Diagnosis not present

## 2017-07-07 DIAGNOSIS — M5136 Other intervertebral disc degeneration, lumbar region: Secondary | ICD-10-CM | POA: Diagnosis not present

## 2017-07-08 DIAGNOSIS — G2581 Restless legs syndrome: Secondary | ICD-10-CM | POA: Diagnosis not present

## 2017-07-08 DIAGNOSIS — R262 Difficulty in walking, not elsewhere classified: Secondary | ICD-10-CM | POA: Diagnosis not present

## 2017-07-08 DIAGNOSIS — R413 Other amnesia: Secondary | ICD-10-CM | POA: Diagnosis not present

## 2017-07-08 DIAGNOSIS — R251 Tremor, unspecified: Secondary | ICD-10-CM | POA: Diagnosis not present

## 2017-07-08 DIAGNOSIS — G5600 Carpal tunnel syndrome, unspecified upper limb: Secondary | ICD-10-CM | POA: Diagnosis not present

## 2017-07-09 DIAGNOSIS — F419 Anxiety disorder, unspecified: Secondary | ICD-10-CM | POA: Diagnosis not present

## 2017-07-09 DIAGNOSIS — I440 Atrioventricular block, first degree: Secondary | ICD-10-CM | POA: Diagnosis not present

## 2017-07-09 DIAGNOSIS — N183 Chronic kidney disease, stage 3 (moderate): Secondary | ICD-10-CM | POA: Diagnosis not present

## 2017-07-09 DIAGNOSIS — F0390 Unspecified dementia without behavioral disturbance: Secondary | ICD-10-CM | POA: Diagnosis not present

## 2017-07-09 DIAGNOSIS — I129 Hypertensive chronic kidney disease with stage 1 through stage 4 chronic kidney disease, or unspecified chronic kidney disease: Secondary | ICD-10-CM | POA: Diagnosis not present

## 2017-07-09 DIAGNOSIS — M5136 Other intervertebral disc degeneration, lumbar region: Secondary | ICD-10-CM | POA: Diagnosis not present

## 2017-07-10 DIAGNOSIS — N183 Chronic kidney disease, stage 3 (moderate): Secondary | ICD-10-CM | POA: Diagnosis not present

## 2017-07-10 DIAGNOSIS — F0390 Unspecified dementia without behavioral disturbance: Secondary | ICD-10-CM | POA: Diagnosis not present

## 2017-07-10 DIAGNOSIS — M5136 Other intervertebral disc degeneration, lumbar region: Secondary | ICD-10-CM | POA: Diagnosis not present

## 2017-07-10 DIAGNOSIS — I129 Hypertensive chronic kidney disease with stage 1 through stage 4 chronic kidney disease, or unspecified chronic kidney disease: Secondary | ICD-10-CM | POA: Diagnosis not present

## 2017-07-10 DIAGNOSIS — F419 Anxiety disorder, unspecified: Secondary | ICD-10-CM | POA: Diagnosis not present

## 2017-07-10 DIAGNOSIS — I440 Atrioventricular block, first degree: Secondary | ICD-10-CM | POA: Diagnosis not present

## 2017-07-13 ENCOUNTER — Emergency Department: Payer: Medicare HMO

## 2017-07-13 ENCOUNTER — Emergency Department
Admission: EM | Admit: 2017-07-13 | Discharge: 2017-07-13 | Disposition: A | Discharge status: Home / Self Care | Payer: Medicare HMO | Attending: Emergency Medicine | Admitting: Emergency Medicine

## 2017-07-13 DIAGNOSIS — Z79899 Other long term (current) drug therapy: Secondary | ICD-10-CM | POA: Insufficient documentation

## 2017-07-13 DIAGNOSIS — M8000XA Age-related osteoporosis with current pathological fracture, unspecified site, initial encounter for fracture: Secondary | ICD-10-CM | POA: Diagnosis not present

## 2017-07-13 DIAGNOSIS — Z23 Encounter for immunization: Secondary | ICD-10-CM | POA: Diagnosis not present

## 2017-07-13 DIAGNOSIS — I129 Hypertensive chronic kidney disease with stage 1 through stage 4 chronic kidney disease, or unspecified chronic kidney disease: Secondary | ICD-10-CM | POA: Diagnosis not present

## 2017-07-13 DIAGNOSIS — G5603 Carpal tunnel syndrome, bilateral upper limbs: Secondary | ICD-10-CM | POA: Diagnosis not present

## 2017-07-13 DIAGNOSIS — S0181XA Laceration without foreign body of other part of head, initial encounter: Secondary | ICD-10-CM

## 2017-07-13 DIAGNOSIS — Z7982 Long term (current) use of aspirin: Secondary | ICD-10-CM | POA: Insufficient documentation

## 2017-07-13 DIAGNOSIS — Y999 Unspecified external cause status: Secondary | ICD-10-CM | POA: Insufficient documentation

## 2017-07-13 DIAGNOSIS — Y929 Unspecified place or not applicable: Secondary | ICD-10-CM | POA: Insufficient documentation

## 2017-07-13 DIAGNOSIS — E039 Hypothyroidism, unspecified: Secondary | ICD-10-CM | POA: Insufficient documentation

## 2017-07-13 DIAGNOSIS — W19XXXA Unspecified fall, initial encounter: Secondary | ICD-10-CM

## 2017-07-13 DIAGNOSIS — S0990XA Unspecified injury of head, initial encounter: Secondary | ICD-10-CM | POA: Diagnosis not present

## 2017-07-13 DIAGNOSIS — F0390 Unspecified dementia without behavioral disturbance: Secondary | ICD-10-CM | POA: Diagnosis not present

## 2017-07-13 DIAGNOSIS — Y9389 Activity, other specified: Secondary | ICD-10-CM | POA: Diagnosis not present

## 2017-07-13 DIAGNOSIS — M5136 Other intervertebral disc degeneration, lumbar region: Secondary | ICD-10-CM | POA: Diagnosis not present

## 2017-07-13 DIAGNOSIS — S098XXA Other specified injuries of head, initial encounter: Secondary | ICD-10-CM | POA: Diagnosis present

## 2017-07-13 DIAGNOSIS — N183 Chronic kidney disease, stage 3 (moderate): Secondary | ICD-10-CM | POA: Insufficient documentation

## 2017-07-13 DIAGNOSIS — W1830XA Fall on same level, unspecified, initial encounter: Secondary | ICD-10-CM | POA: Diagnosis not present

## 2017-07-13 DIAGNOSIS — S199XXA Unspecified injury of neck, initial encounter: Secondary | ICD-10-CM | POA: Diagnosis not present

## 2017-07-13 MED ORDER — TETANUS-DIPHTH-ACELL PERTUSSIS 5-2.5-18.5 LF-MCG/0.5 IM SUSP
0.5000 mL | Freq: Once | INTRAMUSCULAR | Status: AC
  Administered 2017-07-13: 0.5 mL via INTRAMUSCULAR
  Filled 2017-07-13: qty 0.5

## 2017-07-13 NOTE — ED Provider Notes (Signed)
[facility] Emergency Department Provider Note  ___________________________________________   First MD Initiated Contact with Patient 07/13/17 2120     (approximate)  I have reviewed the triage vital signs and the nursing notes.   HISTORY  Chief Complaint Fall   HPI Carrie Calhoun is a 84 y.o. female with history of dementia as well as anxiety who is presenting to the emergency department after a fall.  She is company by her children he says that she has been having "a bad day."  They said that the patient is intermittently with better and worse memory depending on the day.  Also with history of falls.  Patient said that she awoke and thought it was morning and started to fix her bed and then fell.  Unclear of how the patient fell.  The fall was unwitnessed.  However, she did sustain a laceration to her forehead as well as a skin tear to the right side of her cheek.  The patient's children said that they do not know the last tetanus shot date.  Patient not on any blood thinners.  Patient denies any pain in her hips.  Patient does complain of neck pain.  However, is unable to localize.  Past Medical History:  Diagnosis Date  . Anemia, unspecified   . Anxiety   . Atrioventricular block 06/09/2008   1st degree with bradycardia  . Cardiac arrest (HCC)   . Chicken pox   . CKD (chronic kidney disease) stage 3, GFR 30-59 ml/min (HCC)   . Diverticulosis   . History of cardiac arrest    during prior cataract surgery, thought to be from anesthesia, per pt. Followed by Dr. Darrold Junker  . Hyperlipidemia, unspecified   . Hypertension   . Hypothyroidism, unspecified   . Osteopenia   . Renal disorder     Patient Active Problem List   Diagnosis Date Noted  . DNR (do not resuscitate) 09/12/2016  . Hip fracture (HCC) 09/10/2016  . Osteopenia 09/02/2016  . Hyperlipidemia, unspecified 09/02/2016  . DDD (degenerative disc disease), lumbar 09/02/2016  . CKD (chronic  kidney disease), stage III (HCC) 09/02/2016  . Anemia, unspecified 09/02/2016  . Ataxia 07/22/2016  . Dementia arising in the senium and presenium 07/15/2016  . Multiple rib fractures 07/04/2016  . Moderate major depression, single episode (HCC) 06/14/2016  . Difficulty walking 01/08/2016  . Hypothyroidism due to acquired atrophy of thyroid 07/21/2015  . Paresthesia of both hands 06/28/2014  . Bilateral carpal tunnel syndrome 06/28/2014  . Tremor 12/28/2013  . Restless legs 12/28/2013  . Loss of memory 12/28/2013  . Essential hypertension 12/28/2013    Past Surgical History:  Procedure Laterality Date  . APPENDECTOMY  1970  . CATARACT EXTRACTION, BILATERAL    . COLONOSCOPY  04/29/2006  . FOOT SURGERY Bilateral   . INTRAMEDULLARY (IM) NAIL INTERTROCHANTERIC Right 09/10/2016   Procedure: INTRAMEDULLARY (IM) NAIL INTERTROCHANTRIC;  Surgeon: [name], MD;  Location: [facility] ORS;  Service: Orthopedics;  Laterality: Right;  . TUBAL LIGATION Bilateral 1970    Prior to Admission medications   Medication Sig Start Date End Date Taking? Authorizing Provider  acetaminophen (TYLENOL) 325 MG tablet Take 2 tablets (650 mg total) by mouth every 6 (six) hours as needed for mild pain (or Fever >/= 101). 07/08/16   Milagros Loll, MD  acetaminophen (TYLENOL) 325 MG tablet Take 650 mg by mouth 4 (four) times daily.    [provider]  aspirin EC 81 MG tablet Take 1 tablet  by mouth daily.    [provider]  busPIRone (BUSPAR) 15 MG tablet Take 1 tablet by mouth daily.  05/13/16   [provider]  Calcium Carbonate-Vitamin D 600-400 MG-UNIT tablet Take 1 tablet by mouth daily.     [provider]  citalopram (CELEXA) 10 MG tablet Take 1 tablet (10 mg total) by mouth every other day. 07/08/16   Milagros Loll, MD  docusate sodium (COLACE) 100 MG capsule Take 1 capsule (100 mg total) by mouth 2 (two) times daily. Patient not taking: Reported on 11/18/2016 09/13/16    Milagros Loll, MD  donepezil (ARICEPT) 5 MG tablet Take 1 tablet by mouth daily.  05/13/16   [provider]  levothyroxine (SYNTHROID, LEVOTHROID) 75 MCG tablet Take 1 tablet by mouth daily before breakfast.  03/11/16   [provider]  Lifitegrast 5 % SOLN Place 1 drop into both eyes 2 (two) times daily.     [provider]  methocarbamol (ROBAXIN) 500 MG tablet Take 1 tablet (500 mg total) by mouth every 6 (six) hours as needed for muscle spasms. Patient not taking: Reported on 11/18/2016 09/13/16   Milagros Loll, MD  mirabegron ER (MYRBETRIQ) 25 MG TB24 tablet Take 1 tablet (25 mg total) by mouth daily. Patient not taking: Reported on 11/18/2016 09/02/16   Michiel Cowboy A, PA-C  Multiple Vitamin (MULTIVITAMIN) tablet Take 1 tablet by mouth daily.    [provider]  Omega-3 Fatty Acids (FISH OIL PO) Take 1 tablet by mouth daily.    [provider]  traMADol (ULTRAM) 50 MG tablet Take 1 tablet (50 mg total) by mouth every 4 (four) hours as needed. Patient not taking: Reported on 11/18/2016 10/14/16   Lorenso Quarry, NP  traMADol (ULTRAM) 50 MG tablet Take 1 tablet (50 mg total) by mouth 4 (four) times daily. Scheduled for pain. Ok to give prn doses in addition to scheduled for severe pain Patient not taking: Reported on 11/18/2016 10/14/16   Lorenso Quarry, NP    Allergies Alendronate; Lidocaine; Phenylephrine; Phenyltoloxamine-acetaminophen; and Azithromycin  Family History  Problem Relation Age of Onset  . Hypertension Mother   . Stroke Mother   . Osteoporosis Mother   . Thyroid disease Mother   . Breast cancer Maternal Aunt   . Breast cancer Maternal Aunt     Social History Social History   Tobacco Use  . Smoking status: Never Smoker  . Smokeless tobacco: Never Used  Substance Use Topics  . Alcohol use: No  . Drug use: No    Review of Systems  Constitutional: No fever/chills Eyes: No visual changes. ENT: No sore  throat. Cardiovascular: Denies chest pain. Respiratory: Denies shortness of breath. Gastrointestinal: No abdominal pain.  No nausea, no vomiting.  No diarrhea.  No constipation. Genitourinary: Negative for dysuria. Musculoskeletal: Negative for back pain. Skin: Negative for rash. Neurological: Negative for headaches, focal weakness or numbness.  However, patient's review of systems confounded by dementia.  ____________________________________________   PHYSICAL EXAM:  VITAL SIGNS: ED Triage Vitals [07/13/17 2119]  Enc Vitals Group     BP      Pulse      Resp      Temp      Temp src      SpO2      Weight 110 lb (49.9 kg)     Height      Head Circumference      Peak Flow      Pain Score  Pain Loc      Pain Edu?      Excl. in GC?     Constitutional: Alert and oriented to self.  Appears anxious Eyes: Conjunctivae are normal.  Head: 1 inch laceration was in a vertical orientation to the center of the forehead.  This is down to the adipose level.  However, I am unable to see galea.  No active bleeding at this time.  No depression.   Nose: No congestion/rhinnorhea. Mouth/Throat: Mucous membranes are moist.  Neck: No stridor.  Tenderness to palpation to the midline cervical spine.  Patient ranging head neck freely. Cardiovascular: Normal rate, regular rhythm. Grossly normal heart sounds.  Good peripheral circulation. Respiratory: Normal respiratory effort.  No retractions. Lungs CTAB. Gastrointestinal: Soft and nontender. No distention. No CVA tenderness. Musculoskeletal: No lower extremity tenderness nor edema.  No joint effusions.  5 out of 5 strength of bilateral lower extreme knees.  No tenderness to the pelvis nor the bilateral hips.  No limb shortening.  No tenderness over the chest wall. Neurologic:  Normal speech and language. No gross focal neurologic deficits are appreciated. Skin:  Skin is warm, dry and intact. No rash noted. Psychiatric: Mood and affect are  normal. Speech and behavior are normal.  ____________________________________________   LABS (all labs ordered are listed, but only abnormal results are displayed)  Labs Reviewed - No data to display ____________________________________________  EKG   ____________________________________________  RADIOLOGY  No evidence of acute intracranial abnormality.  No acute injury to the cervical spine. ____________________________________________   PROCEDURES  Procedure(s) performed:   Marland KitchenMarland KitchenLaceration Repair Date/Time: 07/13/2017 11:14 PM Performed by: Myrna Blazer, MD Authorized by: Myrna Blazer, MD   Consent:    Consent obtained:  Emergent situation and verbal   Consent given by:  Healthcare agent   Alternatives discussed:  No treatment Anesthesia (see MAR for exact dosages):    Anesthesia method:  None Laceration details:    Location:  Face   Face location:  Forehead   Length (cm):  4   Depth (mm):  3 Repair type:    Repair type:  Simple Exploration:    Hemostasis achieved with:  Direct pressure   Wound exploration: entire depth of wound probed and visualized     Contaminated: no   Treatment:    Area cleansed with:  Saline   Amount of cleaning:  Standard   Irrigation solution:  Sterile saline   Irrigation volume:     Irrigation method:  Pressure wash   Visualized foreign bodies/material removed: no   Skin repair:    Repair method:  Steri-Strips and tissue adhesive   Number of Steri-Strips:  3 Approximation:    Approximation:  Close Post-procedure details:    Patient tolerance of procedure:  Tolerated well, no immediate complications    Critical Care performed:   ____________________________________________   INITIAL IMPRESSION / ASSESSMENT AND PLAN / ED COURSE  Pertinent labs & imaging results that were available during my care of the patient were reviewed by me and considered in my medical decision making (see chart for  details).  DDX: Forehead laceration, skull fracture, intrarenal hemorrhage, cervical spine fracture, whiplash injury, syncope, mechanical fall, skin tear As part of my medical decision making, I reviewed the following data within the electronic MEDICAL RECORD NUMBER Notes from prior ED visits  ----------------------------------------- 9:47 PM on 07/13/2017 -----------------------------------------  I discussed a more conference of work-up with the family and they said that they would like imaging but  do not think that urine and labs are necessary at this time.  I plan to glue the laceration as it is linear and the edges are well approximated.  ----------------------------------------- 11:15 PM on 07/13/2017 -----------------------------------------  Patient at this time resting without any distress.  Will be discharged to home.  No acute finding on the CAT scans.  Family will transport. ____________________________________________   FINAL CLINICAL IMPRESSION(S) / ED DIAGNOSES  Fall.  Forehead laceration.  Skin tear.    NEW MEDICATIONS STARTED DURING THIS VISIT:  New Prescriptions   No medications on file     Note:  This document was prepared using Dragon voice recognition software and may include unintentional dictation errors.     Myrna Blazer, MD 07/13/17 [phone]

## 2017-07-13 NOTE — ED Triage Notes (Signed)
Patient coming [facility] from [facility] for unwitnessed fall. Patient was attempting to make bed and slipped. Patient has laceration to medial forehead and small skin tear to right cheek.

## 2017-07-13 NOTE — ED Notes (Signed)
Reviewed discharge instructions, follow-up care, and laceration care with patient, family, and nursing home RN. Patient/family/RN verbalized understanding of all information reviewed. Patient stable, with no distress noted at this time.

## 2017-07-14 DIAGNOSIS — M5136 Other intervertebral disc degeneration, lumbar region: Secondary | ICD-10-CM | POA: Diagnosis not present

## 2017-07-14 DIAGNOSIS — N183 Chronic kidney disease, stage 3 (moderate): Secondary | ICD-10-CM | POA: Diagnosis not present

## 2017-07-14 DIAGNOSIS — I129 Hypertensive chronic kidney disease with stage 1 through stage 4 chronic kidney disease, or unspecified chronic kidney disease: Secondary | ICD-10-CM | POA: Diagnosis not present

## 2017-07-14 DIAGNOSIS — M47812 Spondylosis without myelopathy or radiculopathy, cervical region: Secondary | ICD-10-CM | POA: Diagnosis not present

## 2017-07-14 DIAGNOSIS — F0390 Unspecified dementia without behavioral disturbance: Secondary | ICD-10-CM | POA: Diagnosis not present

## 2017-07-14 DIAGNOSIS — F419 Anxiety disorder, unspecified: Secondary | ICD-10-CM | POA: Diagnosis not present

## 2017-07-14 DIAGNOSIS — I440 Atrioventricular block, first degree: Secondary | ICD-10-CM | POA: Diagnosis not present

## 2017-07-15 DIAGNOSIS — F0390 Unspecified dementia without behavioral disturbance: Secondary | ICD-10-CM | POA: Diagnosis not present

## 2017-07-15 DIAGNOSIS — F419 Anxiety disorder, unspecified: Secondary | ICD-10-CM | POA: Diagnosis not present

## 2017-07-15 DIAGNOSIS — I129 Hypertensive chronic kidney disease with stage 1 through stage 4 chronic kidney disease, or unspecified chronic kidney disease: Secondary | ICD-10-CM | POA: Diagnosis not present

## 2017-07-15 DIAGNOSIS — I440 Atrioventricular block, first degree: Secondary | ICD-10-CM | POA: Diagnosis not present

## 2017-07-15 DIAGNOSIS — N183 Chronic kidney disease, stage 3 (moderate): Secondary | ICD-10-CM | POA: Diagnosis not present

## 2017-07-15 DIAGNOSIS — M5136 Other intervertebral disc degeneration, lumbar region: Secondary | ICD-10-CM | POA: Diagnosis not present

## 2017-07-17 DIAGNOSIS — I129 Hypertensive chronic kidney disease with stage 1 through stage 4 chronic kidney disease, or unspecified chronic kidney disease: Secondary | ICD-10-CM | POA: Diagnosis not present

## 2017-07-17 DIAGNOSIS — F419 Anxiety disorder, unspecified: Secondary | ICD-10-CM | POA: Diagnosis not present

## 2017-07-17 DIAGNOSIS — N183 Chronic kidney disease, stage 3 (moderate): Secondary | ICD-10-CM | POA: Diagnosis not present

## 2017-07-17 DIAGNOSIS — F0390 Unspecified dementia without behavioral disturbance: Secondary | ICD-10-CM | POA: Diagnosis not present

## 2017-07-17 DIAGNOSIS — I440 Atrioventricular block, first degree: Secondary | ICD-10-CM | POA: Diagnosis not present

## 2017-07-17 DIAGNOSIS — M5136 Other intervertebral disc degeneration, lumbar region: Secondary | ICD-10-CM | POA: Diagnosis not present

## 2017-07-21 DIAGNOSIS — I129 Hypertensive chronic kidney disease with stage 1 through stage 4 chronic kidney disease, or unspecified chronic kidney disease: Secondary | ICD-10-CM | POA: Diagnosis not present

## 2017-07-21 DIAGNOSIS — M5136 Other intervertebral disc degeneration, lumbar region: Secondary | ICD-10-CM | POA: Diagnosis not present

## 2017-07-21 DIAGNOSIS — F419 Anxiety disorder, unspecified: Secondary | ICD-10-CM | POA: Diagnosis not present

## 2017-07-21 DIAGNOSIS — I440 Atrioventricular block, first degree: Secondary | ICD-10-CM | POA: Diagnosis not present

## 2017-07-21 DIAGNOSIS — F0390 Unspecified dementia without behavioral disturbance: Secondary | ICD-10-CM | POA: Diagnosis not present

## 2017-07-21 DIAGNOSIS — F321 Major depressive disorder, single episode, moderate: Secondary | ICD-10-CM | POA: Diagnosis not present

## 2017-07-21 DIAGNOSIS — N183 Chronic kidney disease, stage 3 (moderate): Secondary | ICD-10-CM | POA: Diagnosis not present

## 2017-07-23 DIAGNOSIS — F0390 Unspecified dementia without behavioral disturbance: Secondary | ICD-10-CM | POA: Diagnosis not present

## 2017-07-23 DIAGNOSIS — M5136 Other intervertebral disc degeneration, lumbar region: Secondary | ICD-10-CM | POA: Diagnosis not present

## 2017-07-23 DIAGNOSIS — F419 Anxiety disorder, unspecified: Secondary | ICD-10-CM | POA: Diagnosis not present

## 2017-07-23 DIAGNOSIS — I129 Hypertensive chronic kidney disease with stage 1 through stage 4 chronic kidney disease, or unspecified chronic kidney disease: Secondary | ICD-10-CM | POA: Diagnosis not present

## 2017-07-23 DIAGNOSIS — N183 Chronic kidney disease, stage 3 (moderate): Secondary | ICD-10-CM | POA: Diagnosis not present

## 2017-07-23 DIAGNOSIS — I440 Atrioventricular block, first degree: Secondary | ICD-10-CM | POA: Diagnosis not present

## 2017-07-24 DIAGNOSIS — M5136 Other intervertebral disc degeneration, lumbar region: Secondary | ICD-10-CM | POA: Diagnosis not present

## 2017-07-24 DIAGNOSIS — I440 Atrioventricular block, first degree: Secondary | ICD-10-CM | POA: Diagnosis not present

## 2017-07-24 DIAGNOSIS — F419 Anxiety disorder, unspecified: Secondary | ICD-10-CM | POA: Diagnosis not present

## 2017-07-24 DIAGNOSIS — I129 Hypertensive chronic kidney disease with stage 1 through stage 4 chronic kidney disease, or unspecified chronic kidney disease: Secondary | ICD-10-CM | POA: Diagnosis not present

## 2017-07-24 DIAGNOSIS — N183 Chronic kidney disease, stage 3 (moderate): Secondary | ICD-10-CM | POA: Diagnosis not present

## 2017-07-24 DIAGNOSIS — F0390 Unspecified dementia without behavioral disturbance: Secondary | ICD-10-CM | POA: Diagnosis not present

## 2017-07-29 DIAGNOSIS — N183 Chronic kidney disease, stage 3 (moderate): Secondary | ICD-10-CM | POA: Diagnosis not present

## 2017-07-29 DIAGNOSIS — M5136 Other intervertebral disc degeneration, lumbar region: Secondary | ICD-10-CM | POA: Diagnosis not present

## 2017-07-29 DIAGNOSIS — F419 Anxiety disorder, unspecified: Secondary | ICD-10-CM | POA: Diagnosis not present

## 2017-07-29 DIAGNOSIS — I440 Atrioventricular block, first degree: Secondary | ICD-10-CM | POA: Diagnosis not present

## 2017-07-29 DIAGNOSIS — F0390 Unspecified dementia without behavioral disturbance: Secondary | ICD-10-CM | POA: Diagnosis not present

## 2017-07-29 DIAGNOSIS — I129 Hypertensive chronic kidney disease with stage 1 through stage 4 chronic kidney disease, or unspecified chronic kidney disease: Secondary | ICD-10-CM | POA: Diagnosis not present

## 2017-07-30 DIAGNOSIS — I129 Hypertensive chronic kidney disease with stage 1 through stage 4 chronic kidney disease, or unspecified chronic kidney disease: Secondary | ICD-10-CM | POA: Diagnosis not present

## 2017-07-30 DIAGNOSIS — M5136 Other intervertebral disc degeneration, lumbar region: Secondary | ICD-10-CM | POA: Diagnosis not present

## 2017-07-30 DIAGNOSIS — F419 Anxiety disorder, unspecified: Secondary | ICD-10-CM | POA: Diagnosis not present

## 2017-07-30 DIAGNOSIS — N183 Chronic kidney disease, stage 3 (moderate): Secondary | ICD-10-CM | POA: Diagnosis not present

## 2017-07-30 DIAGNOSIS — F0390 Unspecified dementia without behavioral disturbance: Secondary | ICD-10-CM | POA: Diagnosis not present

## 2017-07-30 DIAGNOSIS — I440 Atrioventricular block, first degree: Secondary | ICD-10-CM | POA: Diagnosis not present

## 2017-08-01 DIAGNOSIS — M5136 Other intervertebral disc degeneration, lumbar region: Secondary | ICD-10-CM | POA: Diagnosis not present

## 2017-08-01 DIAGNOSIS — I440 Atrioventricular block, first degree: Secondary | ICD-10-CM | POA: Diagnosis not present

## 2017-08-01 DIAGNOSIS — F419 Anxiety disorder, unspecified: Secondary | ICD-10-CM | POA: Diagnosis not present

## 2017-08-01 DIAGNOSIS — F0390 Unspecified dementia without behavioral disturbance: Secondary | ICD-10-CM | POA: Diagnosis not present

## 2017-08-01 DIAGNOSIS — I129 Hypertensive chronic kidney disease with stage 1 through stage 4 chronic kidney disease, or unspecified chronic kidney disease: Secondary | ICD-10-CM | POA: Diagnosis not present

## 2017-08-01 DIAGNOSIS — N183 Chronic kidney disease, stage 3 (moderate): Secondary | ICD-10-CM | POA: Diagnosis not present

## 2017-08-04 DIAGNOSIS — N183 Chronic kidney disease, stage 3 (moderate): Secondary | ICD-10-CM | POA: Diagnosis not present

## 2017-08-04 DIAGNOSIS — F419 Anxiety disorder, unspecified: Secondary | ICD-10-CM | POA: Diagnosis not present

## 2017-08-04 DIAGNOSIS — I440 Atrioventricular block, first degree: Secondary | ICD-10-CM | POA: Diagnosis not present

## 2017-08-04 DIAGNOSIS — M5136 Other intervertebral disc degeneration, lumbar region: Secondary | ICD-10-CM | POA: Diagnosis not present

## 2017-08-04 DIAGNOSIS — I129 Hypertensive chronic kidney disease with stage 1 through stage 4 chronic kidney disease, or unspecified chronic kidney disease: Secondary | ICD-10-CM | POA: Diagnosis not present

## 2017-08-04 DIAGNOSIS — F0390 Unspecified dementia without behavioral disturbance: Secondary | ICD-10-CM | POA: Diagnosis not present

## 2017-08-05 DIAGNOSIS — M5412 Radiculopathy, cervical region: Secondary | ICD-10-CM | POA: Diagnosis not present

## 2017-08-05 DIAGNOSIS — M62838 Other muscle spasm: Secondary | ICD-10-CM | POA: Diagnosis not present

## 2017-08-05 DIAGNOSIS — M5030 Other cervical disc degeneration, unspecified cervical region: Secondary | ICD-10-CM | POA: Diagnosis not present

## 2017-08-05 DIAGNOSIS — M47812 Spondylosis without myelopathy or radiculopathy, cervical region: Secondary | ICD-10-CM | POA: Diagnosis not present

## 2017-08-07 DIAGNOSIS — N183 Chronic kidney disease, stage 3 (moderate): Secondary | ICD-10-CM | POA: Diagnosis not present

## 2017-08-07 DIAGNOSIS — F419 Anxiety disorder, unspecified: Secondary | ICD-10-CM | POA: Diagnosis not present

## 2017-08-07 DIAGNOSIS — I440 Atrioventricular block, first degree: Secondary | ICD-10-CM | POA: Diagnosis not present

## 2017-08-07 DIAGNOSIS — I129 Hypertensive chronic kidney disease with stage 1 through stage 4 chronic kidney disease, or unspecified chronic kidney disease: Secondary | ICD-10-CM | POA: Diagnosis not present

## 2017-08-07 DIAGNOSIS — F0390 Unspecified dementia without behavioral disturbance: Secondary | ICD-10-CM | POA: Diagnosis not present

## 2017-08-07 DIAGNOSIS — M5136 Other intervertebral disc degeneration, lumbar region: Secondary | ICD-10-CM | POA: Diagnosis not present

## 2017-08-13 DIAGNOSIS — F0390 Unspecified dementia without behavioral disturbance: Secondary | ICD-10-CM | POA: Diagnosis not present

## 2017-08-13 DIAGNOSIS — M5136 Other intervertebral disc degeneration, lumbar region: Secondary | ICD-10-CM | POA: Diagnosis not present

## 2017-08-13 DIAGNOSIS — M8000XA Age-related osteoporosis with current pathological fracture, unspecified site, initial encounter for fracture: Secondary | ICD-10-CM | POA: Diagnosis not present

## 2017-08-13 DIAGNOSIS — I440 Atrioventricular block, first degree: Secondary | ICD-10-CM | POA: Diagnosis not present

## 2017-08-13 DIAGNOSIS — F419 Anxiety disorder, unspecified: Secondary | ICD-10-CM | POA: Diagnosis not present

## 2017-08-13 DIAGNOSIS — N183 Chronic kidney disease, stage 3 (moderate): Secondary | ICD-10-CM | POA: Diagnosis not present

## 2017-08-13 DIAGNOSIS — G5603 Carpal tunnel syndrome, bilateral upper limbs: Secondary | ICD-10-CM | POA: Diagnosis not present

## 2017-08-13 DIAGNOSIS — I129 Hypertensive chronic kidney disease with stage 1 through stage 4 chronic kidney disease, or unspecified chronic kidney disease: Secondary | ICD-10-CM | POA: Diagnosis not present

## 2017-08-18 DIAGNOSIS — E7849 Other hyperlipidemia: Secondary | ICD-10-CM | POA: Diagnosis not present

## 2017-08-18 DIAGNOSIS — M5136 Other intervertebral disc degeneration, lumbar region: Secondary | ICD-10-CM | POA: Diagnosis not present

## 2017-08-18 DIAGNOSIS — F419 Anxiety disorder, unspecified: Secondary | ICD-10-CM | POA: Diagnosis not present

## 2017-08-18 DIAGNOSIS — F0390 Unspecified dementia without behavioral disturbance: Secondary | ICD-10-CM | POA: Diagnosis not present

## 2017-08-18 DIAGNOSIS — N183 Chronic kidney disease, stage 3 (moderate): Secondary | ICD-10-CM | POA: Diagnosis not present

## 2017-08-18 DIAGNOSIS — I440 Atrioventricular block, first degree: Secondary | ICD-10-CM | POA: Diagnosis not present

## 2017-08-18 DIAGNOSIS — I129 Hypertensive chronic kidney disease with stage 1 through stage 4 chronic kidney disease, or unspecified chronic kidney disease: Secondary | ICD-10-CM | POA: Diagnosis not present

## 2017-08-21 DIAGNOSIS — I129 Hypertensive chronic kidney disease with stage 1 through stage 4 chronic kidney disease, or unspecified chronic kidney disease: Secondary | ICD-10-CM | POA: Diagnosis not present

## 2017-08-21 DIAGNOSIS — F0390 Unspecified dementia without behavioral disturbance: Secondary | ICD-10-CM | POA: Diagnosis not present

## 2017-08-21 DIAGNOSIS — M5136 Other intervertebral disc degeneration, lumbar region: Secondary | ICD-10-CM | POA: Diagnosis not present

## 2017-08-21 DIAGNOSIS — N183 Chronic kidney disease, stage 3 (moderate): Secondary | ICD-10-CM | POA: Diagnosis not present

## 2017-08-21 DIAGNOSIS — F419 Anxiety disorder, unspecified: Secondary | ICD-10-CM | POA: Diagnosis not present

## 2017-08-21 DIAGNOSIS — I440 Atrioventricular block, first degree: Secondary | ICD-10-CM | POA: Diagnosis not present

## 2017-08-25 DIAGNOSIS — Z0000 Encounter for general adult medical examination without abnormal findings: Secondary | ICD-10-CM | POA: Diagnosis not present

## 2017-08-25 DIAGNOSIS — D649 Anemia, unspecified: Secondary | ICD-10-CM | POA: Diagnosis not present

## 2017-08-25 DIAGNOSIS — E034 Atrophy of thyroid (acquired): Secondary | ICD-10-CM | POA: Diagnosis not present

## 2017-08-25 DIAGNOSIS — N183 Chronic kidney disease, stage 3 (moderate): Secondary | ICD-10-CM | POA: Diagnosis not present

## 2017-08-25 DIAGNOSIS — F419 Anxiety disorder, unspecified: Secondary | ICD-10-CM | POA: Diagnosis not present

## 2017-08-25 DIAGNOSIS — G4733 Obstructive sleep apnea (adult) (pediatric): Secondary | ICD-10-CM | POA: Diagnosis not present

## 2017-08-25 DIAGNOSIS — M5136 Other intervertebral disc degeneration, lumbar region: Secondary | ICD-10-CM | POA: Diagnosis not present

## 2017-08-25 DIAGNOSIS — E7849 Other hyperlipidemia: Secondary | ICD-10-CM | POA: Diagnosis not present

## 2017-08-25 DIAGNOSIS — I440 Atrioventricular block, first degree: Secondary | ICD-10-CM | POA: Diagnosis not present

## 2017-08-25 DIAGNOSIS — M8000XS Age-related osteoporosis with current pathological fracture, unspecified site, sequela: Secondary | ICD-10-CM | POA: Diagnosis not present

## 2017-08-25 DIAGNOSIS — R413 Other amnesia: Secondary | ICD-10-CM | POA: Diagnosis not present

## 2017-08-25 DIAGNOSIS — I10 Essential (primary) hypertension: Secondary | ICD-10-CM | POA: Diagnosis not present

## 2017-08-25 DIAGNOSIS — F0390 Unspecified dementia without behavioral disturbance: Secondary | ICD-10-CM | POA: Diagnosis not present

## 2017-08-25 DIAGNOSIS — I129 Hypertensive chronic kidney disease with stage 1 through stage 4 chronic kidney disease, or unspecified chronic kidney disease: Secondary | ICD-10-CM | POA: Diagnosis not present

## 2017-08-26 DIAGNOSIS — I440 Atrioventricular block, first degree: Secondary | ICD-10-CM | POA: Diagnosis not present

## 2017-08-26 DIAGNOSIS — N183 Chronic kidney disease, stage 3 (moderate): Secondary | ICD-10-CM | POA: Diagnosis not present

## 2017-08-26 DIAGNOSIS — M5136 Other intervertebral disc degeneration, lumbar region: Secondary | ICD-10-CM | POA: Diagnosis not present

## 2017-08-26 DIAGNOSIS — I129 Hypertensive chronic kidney disease with stage 1 through stage 4 chronic kidney disease, or unspecified chronic kidney disease: Secondary | ICD-10-CM | POA: Diagnosis not present

## 2017-08-26 DIAGNOSIS — F419 Anxiety disorder, unspecified: Secondary | ICD-10-CM | POA: Diagnosis not present

## 2017-08-26 DIAGNOSIS — F0390 Unspecified dementia without behavioral disturbance: Secondary | ICD-10-CM | POA: Diagnosis not present

## 2017-09-08 DIAGNOSIS — M5412 Radiculopathy, cervical region: Secondary | ICD-10-CM | POA: Diagnosis not present

## 2017-09-08 DIAGNOSIS — M47812 Spondylosis without myelopathy or radiculopathy, cervical region: Secondary | ICD-10-CM | POA: Diagnosis not present

## 2017-09-08 DIAGNOSIS — M62838 Other muscle spasm: Secondary | ICD-10-CM | POA: Diagnosis not present

## 2017-09-08 DIAGNOSIS — M5030 Other cervical disc degeneration, unspecified cervical region: Secondary | ICD-10-CM | POA: Diagnosis not present

## 2017-09-12 DIAGNOSIS — M8000XA Age-related osteoporosis with current pathological fracture, unspecified site, initial encounter for fracture: Secondary | ICD-10-CM | POA: Diagnosis not present

## 2017-09-12 DIAGNOSIS — M5136 Other intervertebral disc degeneration, lumbar region: Secondary | ICD-10-CM | POA: Diagnosis not present

## 2017-09-12 DIAGNOSIS — G5603 Carpal tunnel syndrome, bilateral upper limbs: Secondary | ICD-10-CM | POA: Diagnosis not present

## 2017-09-14 ENCOUNTER — Emergency Department
Admission: EM | Admit: 2017-09-14 | Discharge: 2017-09-14 | Disposition: A | Discharge status: Home / Self Care | Payer: Medicare HMO | Attending: Emergency Medicine | Admitting: Emergency Medicine

## 2017-09-14 ENCOUNTER — Other Ambulatory Visit: Payer: Self-pay

## 2017-09-14 ENCOUNTER — Emergency Department: Payer: Medicare HMO

## 2017-09-14 DIAGNOSIS — S022XXA Fracture of nasal bones, initial encounter for closed fracture: Secondary | ICD-10-CM

## 2017-09-14 DIAGNOSIS — Y939 Activity, unspecified: Secondary | ICD-10-CM | POA: Diagnosis not present

## 2017-09-14 DIAGNOSIS — Y999 Unspecified external cause status: Secondary | ICD-10-CM | POA: Diagnosis not present

## 2017-09-14 DIAGNOSIS — I129 Hypertensive chronic kidney disease with stage 1 through stage 4 chronic kidney disease, or unspecified chronic kidney disease: Secondary | ICD-10-CM | POA: Diagnosis not present

## 2017-09-14 DIAGNOSIS — I10 Essential (primary) hypertension: Secondary | ICD-10-CM | POA: Diagnosis not present

## 2017-09-14 DIAGNOSIS — W19XXXA Unspecified fall, initial encounter: Secondary | ICD-10-CM

## 2017-09-14 DIAGNOSIS — N183 Chronic kidney disease, stage 3 (moderate): Secondary | ICD-10-CM | POA: Insufficient documentation

## 2017-09-14 DIAGNOSIS — S0101XA Laceration without foreign body of scalp, initial encounter: Secondary | ICD-10-CM | POA: Diagnosis not present

## 2017-09-14 DIAGNOSIS — Z7982 Long term (current) use of aspirin: Secondary | ICD-10-CM | POA: Insufficient documentation

## 2017-09-14 DIAGNOSIS — Y92009 Unspecified place in unspecified non-institutional (private) residence as the place of occurrence of the external cause: Secondary | ICD-10-CM | POA: Insufficient documentation

## 2017-09-14 DIAGNOSIS — E039 Hypothyroidism, unspecified: Secondary | ICD-10-CM | POA: Insufficient documentation

## 2017-09-14 DIAGNOSIS — W0110XA Fall on same level from slipping, tripping and stumbling with subsequent striking against unspecified object, initial encounter: Secondary | ICD-10-CM | POA: Insufficient documentation

## 2017-09-14 DIAGNOSIS — S0181XA Laceration without foreign body of other part of head, initial encounter: Secondary | ICD-10-CM | POA: Insufficient documentation

## 2017-09-14 DIAGNOSIS — Z79899 Other long term (current) drug therapy: Secondary | ICD-10-CM | POA: Insufficient documentation

## 2017-09-14 DIAGNOSIS — S098XXA Other specified injuries of head, initial encounter: Secondary | ICD-10-CM | POA: Diagnosis present

## 2017-09-14 DIAGNOSIS — S0993XA Unspecified injury of face, initial encounter: Secondary | ICD-10-CM | POA: Diagnosis not present

## 2017-09-14 MED ORDER — LIDOCAINE-EPINEPHRINE 1 %-1:100000 IJ SOLN
INTRAMUSCULAR | Status: AC
  Filled 2017-09-14: qty 1

## 2017-09-14 MED ORDER — ACETAMINOPHEN 325 MG PO TABS
650.0000 mg | ORAL_TABLET | Freq: Once | ORAL | Status: AC
  Administered 2017-09-14: 650 mg via ORAL
  Filled 2017-09-14: qty 2

## 2017-09-14 MED ORDER — LIDOCAINE-EPINEPHRINE (PF) 1 %-1:200000 IJ SOLN
30.0000 mL | Freq: Once | INTRAMUSCULAR | Status: DC
  Filled 2017-09-14: qty 30

## 2017-09-14 MED ORDER — LIDOCAINE HCL (PF) 1 % IJ SOLN
5.0000 mL | Freq: Once | INTRAMUSCULAR | Status: DC
  Filled 2017-09-14: qty 5

## 2017-09-14 NOTE — ED Triage Notes (Signed)
Pt fell today. Daughter here with pt. Pt fell, bruising and swelling noted to nose. Laceration noted to forehead. Denies blood thinner use. Denies LOC. Doesn't remember tripping. Denies dizziness. States occasionally falls despite using walker. Pt c/o more pain at lac then nose. Bruising noted to R pinky as well, able to move it on own.   Chart as allergic to lidocaine but family denies. States "I don't know if she's allergic to that."

## 2017-09-14 NOTE — ED Notes (Signed)
First Nurse Note: Pt fell, hitting head. Denies LOC, denies blood thinners.

## 2017-09-14 NOTE — ED Notes (Signed)
Verbal orders from Dr. Orvil FeilPaduchowsi for CT scans.   Urine sample sent to lab in case it's ordered later. Pt had to urinate while in triage.

## 2017-09-14 NOTE — ED Provider Notes (Signed)
[facility] Emergency Department Provider Note ____________________________________________  Time seen: 1639  I have reviewed the triage vital signs and the nursing notes.  HISTORY  Chief Complaint  Fall  HPI Carrie Calhoun is a 84 y.o. female presents to the ED accompanied by her adult daughter, for evaluation after mechanical fall.  Patient fell, resulting in some bruising and swelling to the nose.  She also sustained a laceration to her forehead.  There was no reported loss of consciousness, nausea, vomiting, or dizziness.  She does not take any blood thinners or anticoagulants.  Patient does not remember tripping, but she has had occasional falls despite the use of a walker.  Past Medical History:  Diagnosis Date  . Anemia, unspecified   . Anxiety   . Atrioventricular block 06/09/2008   1st degree with bradycardia  . Cardiac arrest (HCC)   . Chicken pox   . CKD (chronic kidney disease) stage 3, GFR 30-59 ml/min (HCC)   . Diverticulosis   . History of cardiac arrest    during prior cataract surgery, thought to be from anesthesia, per pt. Followed by Dr. Darrold JunkerParaschos  . Hyperlipidemia, unspecified   . Hypertension   . Hypothyroidism, unspecified   . Osteopenia   . Renal disorder     Patient Active Problem List   Diagnosis Date Noted  . DNR (do not resuscitate) 09/12/2016  . Hip fracture (HCC) 09/10/2016  . Osteopenia 09/02/2016  . Hyperlipidemia, unspecified 09/02/2016  . DDD (degenerative disc disease), lumbar 09/02/2016  . CKD (chronic kidney disease), stage III (HCC) 09/02/2016  . Anemia, unspecified 09/02/2016  . Ataxia 07/22/2016  . Dementia arising in the senium and presenium 07/15/2016  . Multiple rib fractures 07/04/2016  . Moderate major depression, single episode (HCC) 06/14/2016  . Difficulty walking 01/08/2016  . Hypothyroidism due to acquired atrophy of thyroid 07/21/2015  . Paresthesia of both hands 06/28/2014  . Bilateral  carpal tunnel syndrome 06/28/2014  . Tremor 12/28/2013  . Restless legs 12/28/2013  . Loss of memory 12/28/2013  . Essential hypertension 12/28/2013    Past Surgical History:  Procedure Laterality Date  . APPENDECTOMY  1970  . CATARACT EXTRACTION, BILATERAL    . COLONOSCOPY  04/29/2006  . FOOT SURGERY Bilateral   . INTRAMEDULLARY (IM) NAIL INTERTROCHANTERIC Right 09/10/2016   Procedure: INTRAMEDULLARY (IM) NAIL INTERTROCHANTRIC;  Surgeon: Juanell FairlyKrasinski, Kevin, MD;  Location: [facility] ORS;  Service: Orthopedics;  Laterality: Right;  . TUBAL LIGATION Bilateral 1970    Prior to Admission medications   Medication Sig Start Date End Date Taking? Authorizing Provider  acetaminophen (TYLENOL) 325 MG tablet Take 2 tablets (650 mg total) by mouth every 6 (six) hours as needed for mild pain (or Fever >/= 101). 07/08/16   Milagros LollSudini, Srikar, MD  acetaminophen (TYLENOL) 325 MG tablet Take 650 mg by mouth 4 (four) times daily.    [provider]  aspirin EC 81 MG tablet Take 1 tablet by mouth daily.    [provider]  busPIRone (BUSPAR) 15 MG tablet Take 1 tablet by mouth daily.  05/13/16   [provider]  Calcium Carbonate-Vitamin D 600-400 MG-UNIT tablet Take 1 tablet by mouth daily.     [provider]  citalopram (CELEXA) 10 MG tablet Take 1 tablet (10 mg total) by mouth every other day. 07/08/16   Milagros LollSudini, Srikar, MD  docusate sodium (COLACE) 100 MG capsule Take 1 capsule (100 mg total) by mouth 2 (two) times daily. Patient not taking: Reported on  11/18/2016 09/13/16   Milagros Loll, MD  donepezil (ARICEPT) 5 MG tablet Take 1 tablet by mouth daily.  05/13/16   [provider]  levothyroxine (SYNTHROID, LEVOTHROID) 75 MCG tablet Take 1 tablet by mouth daily before breakfast.  03/11/16   [provider]  Lifitegrast 5 % SOLN Place 1 drop into both eyes 2 (two) times daily.     [provider]  methocarbamol (ROBAXIN) 500 MG tablet Take 1 tablet (500 mg  total) by mouth every 6 (six) hours as needed for muscle spasms. Patient not taking: Reported on 11/18/2016 09/13/16   Milagros Loll, MD  mirabegron ER (MYRBETRIQ) 25 MG TB24 tablet Take 1 tablet (25 mg total) by mouth daily. Patient not taking: Reported on 11/18/2016 09/02/16   Michiel Cowboy A, PA-C  Multiple Vitamin (MULTIVITAMIN) tablet Take 1 tablet by mouth daily.    [provider]  Omega-3 Fatty Acids (FISH OIL PO) Take 1 tablet by mouth daily.    [provider]  traMADol (ULTRAM) 50 MG tablet Take 1 tablet (50 mg total) by mouth every 4 (four) hours as needed. Patient not taking: Reported on 11/18/2016 10/14/16   Lorenso Quarry, NP  traMADol (ULTRAM) 50 MG tablet Take 1 tablet (50 mg total) by mouth 4 (four) times daily. Scheduled for pain. Ok to give prn doses in addition to scheduled for severe pain Patient not taking: Reported on 11/18/2016 10/14/16   Lorenso Quarry, NP    Allergies Alendronate; Lidocaine; Phenylephrine; Phenyltoloxamine; and Azithromycin  Family History  Problem Relation Age of Onset  . Hypertension Mother   . Stroke Mother   . Osteoporosis Mother   . Thyroid disease Mother   . Breast cancer Maternal Aunt   . Breast cancer Maternal Aunt     Social History Social History   Tobacco Use  . Smoking status: Never Smoker  . Smokeless tobacco: Never Used  Substance Use Topics  . Alcohol use: No  . Drug use: No    Review of Systems  Constitutional: Negative for fever. Eyes: Negative for visual changes. ENT: Negative for sore throat.  Nasal contusion as above. Cardiovascular: Negative for chest pain. Respiratory: Negative for shortness of breath. Gastrointestinal: Negative for abdominal pain, vomiting and diarrhea. Genitourinary: Negative for dysuria. Musculoskeletal: Negative for back pain. Skin: Negative for rash.  Forehead laceration as noted. Neurological: Negative for headaches, focal weakness or  numbness. ____________________________________________  PHYSICAL EXAM:  VITAL SIGNS: ED Triage Vitals  Enc Vitals Group     BP 09/14/17 1421 (!) 188/79     Pulse Rate 09/14/17 1421 68     Resp 09/14/17 1421 14     Temp 09/14/17 1421 98.2 F (36.8 C)     Temp Source 09/14/17 1421 Oral     SpO2 09/14/17 1421 99 %     Weight 09/14/17 1431 122 lb (55.3 kg)     Height 09/14/17 1431 5' (1.524 m)     Head Circumference --      Peak Flow --      Pain Score 09/14/17 1431 8     Pain Loc --      Pain Edu? --      Excl. in GC? --     Constitutional: Alert and oriented. Well appearing and in no distress. Head: Normocephalic and atraumatic, except for some resolving ecchymosis around the right eye from her prior fall.  Patient also has a large laceration to the central forehead.   Eyes: Conjunctivae are normal. PERRL.  Normal extraocular movements Ears: Canals clear. TMs intact bilaterally. Nose: No congestion/rhinorrhea/epistaxis. There is some subtle swelling and bruising across the nasal bridge. Neck: Supple.  Cardiovascular: Normal rate, regular rhythm. Normal distal pulses. Respiratory: Normal respiratory effort. No wheezes/rales/rhonchi. Gastrointestinal: Soft and nontender. No distention. Musculoskeletal: Nontender with normal range of motion in all extremities.  Neurologic: Cranial nerves II through XII grossly intact.  Normal gait without ataxia. Normal speech and language. No gross focal neurologic deficits are appreciated. Skin:  Skin is warm, dry and intact. No rash noted. ____________________________________________   LABS (pertinent positives/negatives) Labs Reviewed - No data to display ____________________________________________  EKG  NSR 63 bpm PR interval 146 ms QRS Duration 70 ms No STEMI ____________________________________________   RADIOLOGY  CT Head w/o CM CT Maxillofacial w/o CM  IMPRESSION: 1. No acute intracranial abnormalities. Chronic small  vessel ischemic disease and brain atrophy noted. 2. Acute fracture of the nasal bone. Overlying soft tissue swelling noted. 3. Left frontal scalp laceration. ____________________________________________  PROCEDURES  Tylenol 650 mg PO  .Marland KitchenLaceration Repair Date/Time: 09/14/2017 6:35 PM Performed by: Lissa Hoard, PA-C Authorized by: Lissa Hoard, PA-C   Consent:    Consent obtained:  Verbal   Consent given by:  Patient   Risks discussed:  Poor cosmetic result and poor wound healing Anesthesia (see MAR for exact dosages):    Anesthesia method:  Local infiltration   Local anesthetic:  Lidocaine 1% WITH epi Laceration details:    Location:  Face   Face location:  Forehead   Length (cm):  6 Repair type:    Repair type:  Intermediate Pre-procedure details:    Preparation:  Patient was prepped and draped in usual sterile fashion Exploration:    Hemostasis achieved with:  Epinephrine   Contaminated: no   Treatment:    Area cleansed with:  Betadine   Amount of cleaning:  Standard   Irrigation solution:  Sterile saline   Irrigation method:  Syringe   Visualized foreign bodies/material removed: no   Skin repair:    Repair method:  Sutures and tissue adhesive   Suture size:  5-0   Suture material:  Chromic gut   Suture technique:  Running and subcuticular   Number of sutures:  1 Approximation:    Approximation:  Close Post-procedure details:    Dressing:  Open (no dressing)   Patient tolerance of procedure:  Tolerated well, no immediate complications  ____________________________________________  INITIAL IMPRESSION / ASSESSMENT AND PLAN / ED COURSE  Geriatric patient with ED evaluation of a mechanical fall resulting in a facial laceration and a nasal fracture. She has had the laceration repaired with subcuticular sutures and wound adhesive. She and her daughter are reassured by the negative head CT although the maxillofacial reveals a nasal fracture.  She will dose Tylenol as needed. She is referred to Dr. Elenore Rota for further evaluation as needed.  ____________________________________________  FINAL CLINICAL IMPRESSION(S) / ED DIAGNOSES  Final diagnoses:  Fall in home, initial encounter  Facial laceration, initial encounter  Closed fracture of nasal bone, initial encounter      Lissa Hoard, PA-C 09/14/17 1844    Nita Sickle, MD 09/14/17 2002

## 2017-09-14 NOTE — Discharge Instructions (Addendum)
Wound clean, dry, and covered.  Avoid any ointments, creams, lotions, or oils to the wound glue.  Take Tylenol as needed for pain relief.  Follow-up with Dr. Graciela HusbandsKlein as needed for wound check.  The wound was sutured with absorbable sutures under the skin, and a thin bit of glue was placed on the skin.  The glue will remain in place for approximately a week and a half. See Dr. Elenore RotaJuengel as needed for fracture follow-up.

## 2017-09-24 DIAGNOSIS — S0101XA Laceration without foreign body of scalp, initial encounter: Secondary | ICD-10-CM | POA: Diagnosis not present

## 2017-09-24 DIAGNOSIS — W01190A Fall on same level from slipping, tripping and stumbling with subsequent striking against furniture, initial encounter: Secondary | ICD-10-CM | POA: Diagnosis not present

## 2017-09-24 DIAGNOSIS — S022XXA Fracture of nasal bones, initial encounter for closed fracture: Secondary | ICD-10-CM | POA: Diagnosis not present

## 2017-09-24 DIAGNOSIS — Y92009 Unspecified place in unspecified non-institutional (private) residence as the place of occurrence of the external cause: Secondary | ICD-10-CM | POA: Diagnosis not present

## 2017-09-25 DIAGNOSIS — Z961 Presence of intraocular lens: Secondary | ICD-10-CM | POA: Diagnosis not present

## 2017-10-01 DIAGNOSIS — G4733 Obstructive sleep apnea (adult) (pediatric): Secondary | ICD-10-CM | POA: Diagnosis not present

## 2017-10-02 DIAGNOSIS — Z780 Asymptomatic menopausal state: Secondary | ICD-10-CM | POA: Diagnosis not present

## 2017-10-02 DIAGNOSIS — E034 Atrophy of thyroid (acquired): Secondary | ICD-10-CM | POA: Diagnosis not present

## 2017-10-07 DIAGNOSIS — M47812 Spondylosis without myelopathy or radiculopathy, cervical region: Secondary | ICD-10-CM | POA: Diagnosis not present

## 2017-10-09 DIAGNOSIS — S0181XD Laceration without foreign body of other part of head, subsequent encounter: Secondary | ICD-10-CM | POA: Diagnosis not present

## 2017-10-09 DIAGNOSIS — S022XXD Fracture of nasal bones, subsequent encounter for fracture with routine healing: Secondary | ICD-10-CM | POA: Diagnosis not present

## 2017-10-09 DIAGNOSIS — I129 Hypertensive chronic kidney disease with stage 1 through stage 4 chronic kidney disease, or unspecified chronic kidney disease: Secondary | ICD-10-CM | POA: Diagnosis not present

## 2017-10-09 DIAGNOSIS — N183 Chronic kidney disease, stage 3 (moderate): Secondary | ICD-10-CM | POA: Diagnosis not present

## 2017-10-09 DIAGNOSIS — M5136 Other intervertebral disc degeneration, lumbar region: Secondary | ICD-10-CM | POA: Diagnosis not present

## 2017-10-09 DIAGNOSIS — D631 Anemia in chronic kidney disease: Secondary | ICD-10-CM | POA: Diagnosis not present

## 2017-10-13 DIAGNOSIS — M5136 Other intervertebral disc degeneration, lumbar region: Secondary | ICD-10-CM | POA: Diagnosis not present

## 2017-10-13 DIAGNOSIS — M8000XA Age-related osteoporosis with current pathological fracture, unspecified site, initial encounter for fracture: Secondary | ICD-10-CM | POA: Diagnosis not present

## 2017-10-13 DIAGNOSIS — G5603 Carpal tunnel syndrome, bilateral upper limbs: Secondary | ICD-10-CM | POA: Diagnosis not present

## 2017-10-14 DIAGNOSIS — N183 Chronic kidney disease, stage 3 (moderate): Secondary | ICD-10-CM | POA: Diagnosis not present

## 2017-10-14 DIAGNOSIS — I129 Hypertensive chronic kidney disease with stage 1 through stage 4 chronic kidney disease, or unspecified chronic kidney disease: Secondary | ICD-10-CM | POA: Diagnosis not present

## 2017-10-14 DIAGNOSIS — S0181XD Laceration without foreign body of other part of head, subsequent encounter: Secondary | ICD-10-CM | POA: Diagnosis not present

## 2017-10-14 DIAGNOSIS — M5136 Other intervertebral disc degeneration, lumbar region: Secondary | ICD-10-CM | POA: Diagnosis not present

## 2017-10-14 DIAGNOSIS — S022XXD Fracture of nasal bones, subsequent encounter for fracture with routine healing: Secondary | ICD-10-CM | POA: Diagnosis not present

## 2017-10-14 DIAGNOSIS — D631 Anemia in chronic kidney disease: Secondary | ICD-10-CM | POA: Diagnosis not present

## 2017-10-16 DIAGNOSIS — S022XXD Fracture of nasal bones, subsequent encounter for fracture with routine healing: Secondary | ICD-10-CM | POA: Diagnosis not present

## 2017-10-16 DIAGNOSIS — M5136 Other intervertebral disc degeneration, lumbar region: Secondary | ICD-10-CM | POA: Diagnosis not present

## 2017-10-16 DIAGNOSIS — S0181XD Laceration without foreign body of other part of head, subsequent encounter: Secondary | ICD-10-CM | POA: Diagnosis not present

## 2017-10-16 DIAGNOSIS — N183 Chronic kidney disease, stage 3 (moderate): Secondary | ICD-10-CM | POA: Diagnosis not present

## 2017-10-16 DIAGNOSIS — D631 Anemia in chronic kidney disease: Secondary | ICD-10-CM | POA: Diagnosis not present

## 2017-10-16 DIAGNOSIS — I129 Hypertensive chronic kidney disease with stage 1 through stage 4 chronic kidney disease, or unspecified chronic kidney disease: Secondary | ICD-10-CM | POA: Diagnosis not present

## 2017-10-20 DIAGNOSIS — N183 Chronic kidney disease, stage 3 (moderate): Secondary | ICD-10-CM | POA: Diagnosis not present

## 2017-10-20 DIAGNOSIS — I129 Hypertensive chronic kidney disease with stage 1 through stage 4 chronic kidney disease, or unspecified chronic kidney disease: Secondary | ICD-10-CM | POA: Diagnosis not present

## 2017-10-20 DIAGNOSIS — S0181XD Laceration without foreign body of other part of head, subsequent encounter: Secondary | ICD-10-CM | POA: Diagnosis not present

## 2017-10-20 DIAGNOSIS — D631 Anemia in chronic kidney disease: Secondary | ICD-10-CM | POA: Diagnosis not present

## 2017-10-20 DIAGNOSIS — S022XXD Fracture of nasal bones, subsequent encounter for fracture with routine healing: Secondary | ICD-10-CM | POA: Diagnosis not present

## 2017-10-20 DIAGNOSIS — M5136 Other intervertebral disc degeneration, lumbar region: Secondary | ICD-10-CM | POA: Diagnosis not present

## 2017-10-21 DIAGNOSIS — G2581 Restless legs syndrome: Secondary | ICD-10-CM | POA: Diagnosis not present

## 2017-10-21 DIAGNOSIS — D631 Anemia in chronic kidney disease: Secondary | ICD-10-CM | POA: Diagnosis not present

## 2017-10-21 DIAGNOSIS — M8580 Other specified disorders of bone density and structure, unspecified site: Secondary | ICD-10-CM | POA: Diagnosis not present

## 2017-10-21 DIAGNOSIS — M5136 Other intervertebral disc degeneration, lumbar region: Secondary | ICD-10-CM | POA: Diagnosis not present

## 2017-10-21 DIAGNOSIS — S0181XD Laceration without foreign body of other part of head, subsequent encounter: Secondary | ICD-10-CM | POA: Diagnosis not present

## 2017-10-21 DIAGNOSIS — I129 Hypertensive chronic kidney disease with stage 1 through stage 4 chronic kidney disease, or unspecified chronic kidney disease: Secondary | ICD-10-CM | POA: Diagnosis not present

## 2017-10-21 DIAGNOSIS — S022XXD Fracture of nasal bones, subsequent encounter for fracture with routine healing: Secondary | ICD-10-CM | POA: Diagnosis not present

## 2017-10-21 DIAGNOSIS — G5603 Carpal tunnel syndrome, bilateral upper limbs: Secondary | ICD-10-CM | POA: Diagnosis not present

## 2017-10-21 DIAGNOSIS — N183 Chronic kidney disease, stage 3 (moderate): Secondary | ICD-10-CM | POA: Diagnosis not present

## 2017-10-22 DIAGNOSIS — I129 Hypertensive chronic kidney disease with stage 1 through stage 4 chronic kidney disease, or unspecified chronic kidney disease: Secondary | ICD-10-CM | POA: Diagnosis not present

## 2017-10-22 DIAGNOSIS — M5136 Other intervertebral disc degeneration, lumbar region: Secondary | ICD-10-CM | POA: Diagnosis not present

## 2017-10-22 DIAGNOSIS — S022XXD Fracture of nasal bones, subsequent encounter for fracture with routine healing: Secondary | ICD-10-CM | POA: Diagnosis not present

## 2017-10-22 DIAGNOSIS — N183 Chronic kidney disease, stage 3 (moderate): Secondary | ICD-10-CM | POA: Diagnosis not present

## 2017-10-22 DIAGNOSIS — D631 Anemia in chronic kidney disease: Secondary | ICD-10-CM | POA: Diagnosis not present

## 2017-10-22 DIAGNOSIS — S0181XD Laceration without foreign body of other part of head, subsequent encounter: Secondary | ICD-10-CM | POA: Diagnosis not present

## 2017-10-23 DIAGNOSIS — S0181XD Laceration without foreign body of other part of head, subsequent encounter: Secondary | ICD-10-CM | POA: Diagnosis not present

## 2017-10-23 DIAGNOSIS — I129 Hypertensive chronic kidney disease with stage 1 through stage 4 chronic kidney disease, or unspecified chronic kidney disease: Secondary | ICD-10-CM | POA: Diagnosis not present

## 2017-10-23 DIAGNOSIS — D631 Anemia in chronic kidney disease: Secondary | ICD-10-CM | POA: Diagnosis not present

## 2017-10-23 DIAGNOSIS — N183 Chronic kidney disease, stage 3 (moderate): Secondary | ICD-10-CM | POA: Diagnosis not present

## 2017-10-23 DIAGNOSIS — M5136 Other intervertebral disc degeneration, lumbar region: Secondary | ICD-10-CM | POA: Diagnosis not present

## 2017-10-23 DIAGNOSIS — S022XXD Fracture of nasal bones, subsequent encounter for fracture with routine healing: Secondary | ICD-10-CM | POA: Diagnosis not present

## 2017-10-29 DIAGNOSIS — N183 Chronic kidney disease, stage 3 (moderate): Secondary | ICD-10-CM | POA: Diagnosis not present

## 2017-10-29 DIAGNOSIS — D631 Anemia in chronic kidney disease: Secondary | ICD-10-CM | POA: Diagnosis not present

## 2017-10-29 DIAGNOSIS — S022XXD Fracture of nasal bones, subsequent encounter for fracture with routine healing: Secondary | ICD-10-CM | POA: Diagnosis not present

## 2017-10-29 DIAGNOSIS — S0181XD Laceration without foreign body of other part of head, subsequent encounter: Secondary | ICD-10-CM | POA: Diagnosis not present

## 2017-10-29 DIAGNOSIS — M5136 Other intervertebral disc degeneration, lumbar region: Secondary | ICD-10-CM | POA: Diagnosis not present

## 2017-10-29 DIAGNOSIS — I129 Hypertensive chronic kidney disease with stage 1 through stage 4 chronic kidney disease, or unspecified chronic kidney disease: Secondary | ICD-10-CM | POA: Diagnosis not present

## 2017-10-31 DIAGNOSIS — S0181XD Laceration without foreign body of other part of head, subsequent encounter: Secondary | ICD-10-CM | POA: Diagnosis not present

## 2017-10-31 DIAGNOSIS — S022XXD Fracture of nasal bones, subsequent encounter for fracture with routine healing: Secondary | ICD-10-CM | POA: Diagnosis not present

## 2017-10-31 DIAGNOSIS — M5136 Other intervertebral disc degeneration, lumbar region: Secondary | ICD-10-CM | POA: Diagnosis not present

## 2017-10-31 DIAGNOSIS — N183 Chronic kidney disease, stage 3 (moderate): Secondary | ICD-10-CM | POA: Diagnosis not present

## 2017-10-31 DIAGNOSIS — I129 Hypertensive chronic kidney disease with stage 1 through stage 4 chronic kidney disease, or unspecified chronic kidney disease: Secondary | ICD-10-CM | POA: Diagnosis not present

## 2017-10-31 DIAGNOSIS — D631 Anemia in chronic kidney disease: Secondary | ICD-10-CM | POA: Diagnosis not present

## 2017-11-04 DIAGNOSIS — S022XXD Fracture of nasal bones, subsequent encounter for fracture with routine healing: Secondary | ICD-10-CM | POA: Diagnosis not present

## 2017-11-04 DIAGNOSIS — M5136 Other intervertebral disc degeneration, lumbar region: Secondary | ICD-10-CM | POA: Diagnosis not present

## 2017-11-04 DIAGNOSIS — D631 Anemia in chronic kidney disease: Secondary | ICD-10-CM | POA: Diagnosis not present

## 2017-11-04 DIAGNOSIS — N183 Chronic kidney disease, stage 3 (moderate): Secondary | ICD-10-CM | POA: Diagnosis not present

## 2017-11-04 DIAGNOSIS — I129 Hypertensive chronic kidney disease with stage 1 through stage 4 chronic kidney disease, or unspecified chronic kidney disease: Secondary | ICD-10-CM | POA: Diagnosis not present

## 2017-11-04 DIAGNOSIS — S0181XD Laceration without foreign body of other part of head, subsequent encounter: Secondary | ICD-10-CM | POA: Diagnosis not present

## 2017-11-06 DIAGNOSIS — S0181XD Laceration without foreign body of other part of head, subsequent encounter: Secondary | ICD-10-CM | POA: Diagnosis not present

## 2017-11-06 DIAGNOSIS — N183 Chronic kidney disease, stage 3 (moderate): Secondary | ICD-10-CM | POA: Diagnosis not present

## 2017-11-06 DIAGNOSIS — M5136 Other intervertebral disc degeneration, lumbar region: Secondary | ICD-10-CM | POA: Diagnosis not present

## 2017-11-06 DIAGNOSIS — I129 Hypertensive chronic kidney disease with stage 1 through stage 4 chronic kidney disease, or unspecified chronic kidney disease: Secondary | ICD-10-CM | POA: Diagnosis not present

## 2017-11-06 DIAGNOSIS — S022XXD Fracture of nasal bones, subsequent encounter for fracture with routine healing: Secondary | ICD-10-CM | POA: Diagnosis not present

## 2017-11-06 DIAGNOSIS — D631 Anemia in chronic kidney disease: Secondary | ICD-10-CM | POA: Diagnosis not present

## 2017-11-12 DIAGNOSIS — D631 Anemia in chronic kidney disease: Secondary | ICD-10-CM | POA: Diagnosis not present

## 2017-11-12 DIAGNOSIS — S022XXD Fracture of nasal bones, subsequent encounter for fracture with routine healing: Secondary | ICD-10-CM | POA: Diagnosis not present

## 2017-11-12 DIAGNOSIS — M5136 Other intervertebral disc degeneration, lumbar region: Secondary | ICD-10-CM | POA: Diagnosis not present

## 2017-11-12 DIAGNOSIS — I129 Hypertensive chronic kidney disease with stage 1 through stage 4 chronic kidney disease, or unspecified chronic kidney disease: Secondary | ICD-10-CM | POA: Diagnosis not present

## 2017-11-12 DIAGNOSIS — N183 Chronic kidney disease, stage 3 (moderate): Secondary | ICD-10-CM | POA: Diagnosis not present

## 2017-11-12 DIAGNOSIS — S0181XD Laceration without foreign body of other part of head, subsequent encounter: Secondary | ICD-10-CM | POA: Diagnosis not present

## 2017-11-13 DIAGNOSIS — M8000XA Age-related osteoporosis with current pathological fracture, unspecified site, initial encounter for fracture: Secondary | ICD-10-CM | POA: Diagnosis not present

## 2017-11-13 DIAGNOSIS — G5603 Carpal tunnel syndrome, bilateral upper limbs: Secondary | ICD-10-CM | POA: Diagnosis not present

## 2017-11-13 DIAGNOSIS — M5136 Other intervertebral disc degeneration, lumbar region: Secondary | ICD-10-CM | POA: Diagnosis not present

## 2017-11-14 DIAGNOSIS — D631 Anemia in chronic kidney disease: Secondary | ICD-10-CM | POA: Diagnosis not present

## 2017-11-14 DIAGNOSIS — I129 Hypertensive chronic kidney disease with stage 1 through stage 4 chronic kidney disease, or unspecified chronic kidney disease: Secondary | ICD-10-CM | POA: Diagnosis not present

## 2017-11-14 DIAGNOSIS — S022XXD Fracture of nasal bones, subsequent encounter for fracture with routine healing: Secondary | ICD-10-CM | POA: Diagnosis not present

## 2017-11-14 DIAGNOSIS — N183 Chronic kidney disease, stage 3 (moderate): Secondary | ICD-10-CM | POA: Diagnosis not present

## 2017-11-14 DIAGNOSIS — S0181XD Laceration without foreign body of other part of head, subsequent encounter: Secondary | ICD-10-CM | POA: Diagnosis not present

## 2017-11-14 DIAGNOSIS — M5136 Other intervertebral disc degeneration, lumbar region: Secondary | ICD-10-CM | POA: Diagnosis not present

## 2017-11-18 DIAGNOSIS — M47812 Spondylosis without myelopathy or radiculopathy, cervical region: Secondary | ICD-10-CM | POA: Diagnosis not present

## 2017-11-18 DIAGNOSIS — M5030 Other cervical disc degeneration, unspecified cervical region: Secondary | ICD-10-CM | POA: Diagnosis not present

## 2017-11-18 DIAGNOSIS — M62838 Other muscle spasm: Secondary | ICD-10-CM | POA: Diagnosis not present

## 2017-11-18 DIAGNOSIS — M542 Cervicalgia: Secondary | ICD-10-CM | POA: Diagnosis not present

## 2017-11-18 DIAGNOSIS — M5412 Radiculopathy, cervical region: Secondary | ICD-10-CM | POA: Diagnosis not present

## 2017-12-13 DIAGNOSIS — G5603 Carpal tunnel syndrome, bilateral upper limbs: Secondary | ICD-10-CM | POA: Diagnosis not present

## 2017-12-13 DIAGNOSIS — M8000XA Age-related osteoporosis with current pathological fracture, unspecified site, initial encounter for fracture: Secondary | ICD-10-CM | POA: Diagnosis not present

## 2017-12-13 DIAGNOSIS — M5136 Other intervertebral disc degeneration, lumbar region: Secondary | ICD-10-CM | POA: Diagnosis not present

## 2017-12-25 DIAGNOSIS — Z9181 History of falling: Secondary | ICD-10-CM | POA: Diagnosis not present

## 2017-12-25 DIAGNOSIS — F329 Major depressive disorder, single episode, unspecified: Secondary | ICD-10-CM | POA: Diagnosis not present

## 2017-12-25 DIAGNOSIS — R2689 Other abnormalities of gait and mobility: Secondary | ICD-10-CM | POA: Diagnosis not present

## 2017-12-25 DIAGNOSIS — E039 Hypothyroidism, unspecified: Secondary | ICD-10-CM | POA: Diagnosis not present

## 2017-12-25 DIAGNOSIS — R41841 Cognitive communication deficit: Secondary | ICD-10-CM | POA: Diagnosis not present

## 2017-12-29 DIAGNOSIS — R2689 Other abnormalities of gait and mobility: Secondary | ICD-10-CM | POA: Diagnosis not present

## 2017-12-29 DIAGNOSIS — E039 Hypothyroidism, unspecified: Secondary | ICD-10-CM | POA: Diagnosis not present

## 2017-12-29 DIAGNOSIS — R41841 Cognitive communication deficit: Secondary | ICD-10-CM | POA: Diagnosis not present

## 2017-12-29 DIAGNOSIS — Z9181 History of falling: Secondary | ICD-10-CM | POA: Diagnosis not present

## 2017-12-29 DIAGNOSIS — F329 Major depressive disorder, single episode, unspecified: Secondary | ICD-10-CM | POA: Diagnosis not present

## 2018-01-01 DIAGNOSIS — E039 Hypothyroidism, unspecified: Secondary | ICD-10-CM | POA: Diagnosis not present

## 2018-01-01 DIAGNOSIS — R2689 Other abnormalities of gait and mobility: Secondary | ICD-10-CM | POA: Diagnosis not present

## 2018-01-01 DIAGNOSIS — F329 Major depressive disorder, single episode, unspecified: Secondary | ICD-10-CM | POA: Diagnosis not present

## 2018-01-01 DIAGNOSIS — R41841 Cognitive communication deficit: Secondary | ICD-10-CM | POA: Diagnosis not present

## 2018-01-01 DIAGNOSIS — Z9181 History of falling: Secondary | ICD-10-CM | POA: Diagnosis not present

## 2018-01-05 DIAGNOSIS — F329 Major depressive disorder, single episode, unspecified: Secondary | ICD-10-CM | POA: Diagnosis not present

## 2018-01-05 DIAGNOSIS — E039 Hypothyroidism, unspecified: Secondary | ICD-10-CM | POA: Diagnosis not present

## 2018-01-05 DIAGNOSIS — Z9181 History of falling: Secondary | ICD-10-CM | POA: Diagnosis not present

## 2018-01-05 DIAGNOSIS — R41841 Cognitive communication deficit: Secondary | ICD-10-CM | POA: Diagnosis not present

## 2018-01-05 DIAGNOSIS — R2689 Other abnormalities of gait and mobility: Secondary | ICD-10-CM | POA: Diagnosis not present

## 2018-01-06 DIAGNOSIS — R41841 Cognitive communication deficit: Secondary | ICD-10-CM | POA: Diagnosis not present

## 2018-01-06 DIAGNOSIS — Z9181 History of falling: Secondary | ICD-10-CM | POA: Diagnosis not present

## 2018-01-06 DIAGNOSIS — F329 Major depressive disorder, single episode, unspecified: Secondary | ICD-10-CM | POA: Diagnosis not present

## 2018-01-06 DIAGNOSIS — R2689 Other abnormalities of gait and mobility: Secondary | ICD-10-CM | POA: Diagnosis not present

## 2018-01-06 DIAGNOSIS — E039 Hypothyroidism, unspecified: Secondary | ICD-10-CM | POA: Diagnosis not present

## 2018-01-08 DIAGNOSIS — E039 Hypothyroidism, unspecified: Secondary | ICD-10-CM | POA: Diagnosis not present

## 2018-01-08 DIAGNOSIS — Z9181 History of falling: Secondary | ICD-10-CM | POA: Diagnosis not present

## 2018-01-08 DIAGNOSIS — R41841 Cognitive communication deficit: Secondary | ICD-10-CM | POA: Diagnosis not present

## 2018-01-08 DIAGNOSIS — F329 Major depressive disorder, single episode, unspecified: Secondary | ICD-10-CM | POA: Diagnosis not present

## 2018-01-08 DIAGNOSIS — R2689 Other abnormalities of gait and mobility: Secondary | ICD-10-CM | POA: Diagnosis not present

## 2018-01-12 DIAGNOSIS — Z9181 History of falling: Secondary | ICD-10-CM | POA: Diagnosis not present

## 2018-01-12 DIAGNOSIS — F329 Major depressive disorder, single episode, unspecified: Secondary | ICD-10-CM | POA: Diagnosis not present

## 2018-01-12 DIAGNOSIS — R2689 Other abnormalities of gait and mobility: Secondary | ICD-10-CM | POA: Diagnosis not present

## 2018-01-12 DIAGNOSIS — R41841 Cognitive communication deficit: Secondary | ICD-10-CM | POA: Diagnosis not present

## 2018-01-12 DIAGNOSIS — E039 Hypothyroidism, unspecified: Secondary | ICD-10-CM | POA: Diagnosis not present

## 2018-01-13 DIAGNOSIS — R41841 Cognitive communication deficit: Secondary | ICD-10-CM | POA: Diagnosis not present

## 2018-01-13 DIAGNOSIS — F329 Major depressive disorder, single episode, unspecified: Secondary | ICD-10-CM | POA: Diagnosis not present

## 2018-01-13 DIAGNOSIS — E039 Hypothyroidism, unspecified: Secondary | ICD-10-CM | POA: Diagnosis not present

## 2018-01-13 DIAGNOSIS — R2689 Other abnormalities of gait and mobility: Secondary | ICD-10-CM | POA: Diagnosis not present

## 2018-01-13 DIAGNOSIS — Z9181 History of falling: Secondary | ICD-10-CM | POA: Diagnosis not present

## 2018-01-14 DIAGNOSIS — R252 Cramp and spasm: Secondary | ICD-10-CM | POA: Diagnosis not present

## 2018-01-14 DIAGNOSIS — R262 Difficulty in walking, not elsewhere classified: Secondary | ICD-10-CM | POA: Diagnosis not present

## 2018-01-14 DIAGNOSIS — F4321 Adjustment disorder with depressed mood: Secondary | ICD-10-CM | POA: Diagnosis not present

## 2018-01-14 DIAGNOSIS — F329 Major depressive disorder, single episode, unspecified: Secondary | ICD-10-CM | POA: Diagnosis not present

## 2018-01-14 DIAGNOSIS — R413 Other amnesia: Secondary | ICD-10-CM | POA: Diagnosis not present

## 2018-01-14 DIAGNOSIS — G5603 Carpal tunnel syndrome, bilateral upper limbs: Secondary | ICD-10-CM | POA: Diagnosis not present

## 2018-01-14 DIAGNOSIS — R2689 Other abnormalities of gait and mobility: Secondary | ICD-10-CM | POA: Diagnosis not present

## 2018-01-14 DIAGNOSIS — G479 Sleep disorder, unspecified: Secondary | ICD-10-CM | POA: Diagnosis not present

## 2018-01-14 DIAGNOSIS — Z9181 History of falling: Secondary | ICD-10-CM | POA: Diagnosis not present

## 2018-01-14 DIAGNOSIS — E039 Hypothyroidism, unspecified: Secondary | ICD-10-CM | POA: Diagnosis not present

## 2018-01-14 DIAGNOSIS — G250 Essential tremor: Secondary | ICD-10-CM | POA: Diagnosis not present

## 2018-01-14 DIAGNOSIS — R41841 Cognitive communication deficit: Secondary | ICD-10-CM | POA: Diagnosis not present

## 2018-01-14 DIAGNOSIS — M8000XA Age-related osteoporosis with current pathological fracture, unspecified site, initial encounter for fracture: Secondary | ICD-10-CM | POA: Diagnosis not present

## 2018-01-14 DIAGNOSIS — M5136 Other intervertebral disc degeneration, lumbar region: Secondary | ICD-10-CM | POA: Diagnosis not present

## 2018-01-19 DIAGNOSIS — M8000XS Age-related osteoporosis with current pathological fracture, unspecified site, sequela: Secondary | ICD-10-CM | POA: Diagnosis not present

## 2018-01-19 DIAGNOSIS — N183 Chronic kidney disease, stage 3 (moderate): Secondary | ICD-10-CM | POA: Diagnosis not present

## 2018-01-19 DIAGNOSIS — G4733 Obstructive sleep apnea (adult) (pediatric): Secondary | ICD-10-CM | POA: Diagnosis not present

## 2018-01-19 DIAGNOSIS — G250 Essential tremor: Secondary | ICD-10-CM | POA: Diagnosis not present

## 2018-01-19 DIAGNOSIS — E034 Atrophy of thyroid (acquired): Secondary | ICD-10-CM | POA: Diagnosis not present

## 2018-01-19 DIAGNOSIS — M5136 Other intervertebral disc degeneration, lumbar region: Secondary | ICD-10-CM | POA: Diagnosis not present

## 2018-01-19 DIAGNOSIS — D649 Anemia, unspecified: Secondary | ICD-10-CM | POA: Diagnosis not present

## 2018-01-19 DIAGNOSIS — F0390 Unspecified dementia without behavioral disturbance: Secondary | ICD-10-CM | POA: Diagnosis not present

## 2018-01-19 DIAGNOSIS — I10 Essential (primary) hypertension: Secondary | ICD-10-CM | POA: Diagnosis not present

## 2018-01-20 DIAGNOSIS — R2689 Other abnormalities of gait and mobility: Secondary | ICD-10-CM | POA: Diagnosis not present

## 2018-01-20 DIAGNOSIS — E039 Hypothyroidism, unspecified: Secondary | ICD-10-CM | POA: Diagnosis not present

## 2018-01-20 DIAGNOSIS — R41841 Cognitive communication deficit: Secondary | ICD-10-CM | POA: Diagnosis not present

## 2018-01-20 DIAGNOSIS — F329 Major depressive disorder, single episode, unspecified: Secondary | ICD-10-CM | POA: Diagnosis not present

## 2018-01-20 DIAGNOSIS — Z9181 History of falling: Secondary | ICD-10-CM | POA: Diagnosis not present

## 2018-01-21 DIAGNOSIS — R41841 Cognitive communication deficit: Secondary | ICD-10-CM | POA: Diagnosis not present

## 2018-01-21 DIAGNOSIS — F329 Major depressive disorder, single episode, unspecified: Secondary | ICD-10-CM | POA: Diagnosis not present

## 2018-01-21 DIAGNOSIS — E039 Hypothyroidism, unspecified: Secondary | ICD-10-CM | POA: Diagnosis not present

## 2018-01-21 DIAGNOSIS — Z9181 History of falling: Secondary | ICD-10-CM | POA: Diagnosis not present

## 2018-01-21 DIAGNOSIS — R2689 Other abnormalities of gait and mobility: Secondary | ICD-10-CM | POA: Diagnosis not present

## 2018-01-26 DIAGNOSIS — R41841 Cognitive communication deficit: Secondary | ICD-10-CM | POA: Diagnosis not present

## 2018-01-26 DIAGNOSIS — Z9181 History of falling: Secondary | ICD-10-CM | POA: Diagnosis not present

## 2018-01-26 DIAGNOSIS — R2689 Other abnormalities of gait and mobility: Secondary | ICD-10-CM | POA: Diagnosis not present

## 2018-01-26 DIAGNOSIS — F329 Major depressive disorder, single episode, unspecified: Secondary | ICD-10-CM | POA: Diagnosis not present

## 2018-01-26 DIAGNOSIS — E039 Hypothyroidism, unspecified: Secondary | ICD-10-CM | POA: Diagnosis not present

## 2018-01-27 DIAGNOSIS — F329 Major depressive disorder, single episode, unspecified: Secondary | ICD-10-CM | POA: Diagnosis not present

## 2018-01-27 DIAGNOSIS — E039 Hypothyroidism, unspecified: Secondary | ICD-10-CM | POA: Diagnosis not present

## 2018-01-27 DIAGNOSIS — R41841 Cognitive communication deficit: Secondary | ICD-10-CM | POA: Diagnosis not present

## 2018-01-27 DIAGNOSIS — R2689 Other abnormalities of gait and mobility: Secondary | ICD-10-CM | POA: Diagnosis not present

## 2018-01-27 DIAGNOSIS — Z9181 History of falling: Secondary | ICD-10-CM | POA: Diagnosis not present

## 2018-02-11 DIAGNOSIS — E7849 Other hyperlipidemia: Secondary | ICD-10-CM | POA: Diagnosis not present

## 2018-02-11 DIAGNOSIS — D649 Anemia, unspecified: Secondary | ICD-10-CM | POA: Diagnosis not present

## 2018-02-11 DIAGNOSIS — E034 Atrophy of thyroid (acquired): Secondary | ICD-10-CM | POA: Diagnosis not present

## 2018-02-11 DIAGNOSIS — I10 Essential (primary) hypertension: Secondary | ICD-10-CM | POA: Diagnosis not present

## 2018-02-17 DIAGNOSIS — M47812 Spondylosis without myelopathy or radiculopathy, cervical region: Secondary | ICD-10-CM | POA: Diagnosis not present

## 2018-02-17 DIAGNOSIS — M62838 Other muscle spasm: Secondary | ICD-10-CM | POA: Diagnosis not present

## 2018-02-17 DIAGNOSIS — M5412 Radiculopathy, cervical region: Secondary | ICD-10-CM | POA: Diagnosis not present

## 2018-02-17 DIAGNOSIS — E7849 Other hyperlipidemia: Secondary | ICD-10-CM | POA: Diagnosis not present

## 2018-02-17 DIAGNOSIS — M5136 Other intervertebral disc degeneration, lumbar region: Secondary | ICD-10-CM | POA: Diagnosis not present

## 2018-02-17 DIAGNOSIS — F0390 Unspecified dementia without behavioral disturbance: Secondary | ICD-10-CM | POA: Diagnosis not present

## 2018-02-17 DIAGNOSIS — F331 Major depressive disorder, recurrent, moderate: Secondary | ICD-10-CM | POA: Diagnosis not present

## 2018-02-17 DIAGNOSIS — N183 Chronic kidney disease, stage 3 (moderate): Secondary | ICD-10-CM | POA: Diagnosis not present

## 2018-02-17 DIAGNOSIS — M5030 Other cervical disc degeneration, unspecified cervical region: Secondary | ICD-10-CM | POA: Diagnosis not present

## 2018-02-17 DIAGNOSIS — M8000XS Age-related osteoporosis with current pathological fracture, unspecified site, sequela: Secondary | ICD-10-CM | POA: Diagnosis not present

## 2018-02-17 DIAGNOSIS — I10 Essential (primary) hypertension: Secondary | ICD-10-CM | POA: Diagnosis not present

## 2018-02-17 DIAGNOSIS — G4733 Obstructive sleep apnea (adult) (pediatric): Secondary | ICD-10-CM | POA: Diagnosis not present

## 2018-02-17 DIAGNOSIS — E034 Atrophy of thyroid (acquired): Secondary | ICD-10-CM | POA: Diagnosis not present

## 2018-03-11 ENCOUNTER — Emergency Department: Payer: Medicare HMO

## 2018-03-11 ENCOUNTER — Other Ambulatory Visit: Payer: Self-pay

## 2018-03-11 DIAGNOSIS — Y9301 Activity, walking, marching and hiking: Secondary | ICD-10-CM | POA: Insufficient documentation

## 2018-03-11 DIAGNOSIS — I129 Hypertensive chronic kidney disease with stage 1 through stage 4 chronic kidney disease, or unspecified chronic kidney disease: Secondary | ICD-10-CM | POA: Diagnosis not present

## 2018-03-11 DIAGNOSIS — Z7982 Long term (current) use of aspirin: Secondary | ICD-10-CM | POA: Diagnosis not present

## 2018-03-11 DIAGNOSIS — S199XXA Unspecified injury of neck, initial encounter: Secondary | ICD-10-CM | POA: Diagnosis not present

## 2018-03-11 DIAGNOSIS — Y929 Unspecified place or not applicable: Secondary | ICD-10-CM | POA: Insufficient documentation

## 2018-03-11 DIAGNOSIS — N183 Chronic kidney disease, stage 3 (moderate): Secondary | ICD-10-CM | POA: Insufficient documentation

## 2018-03-11 DIAGNOSIS — S0081XA Abrasion of other part of head, initial encounter: Secondary | ICD-10-CM | POA: Diagnosis not present

## 2018-03-11 DIAGNOSIS — Y998 Other external cause status: Secondary | ICD-10-CM | POA: Diagnosis not present

## 2018-03-11 DIAGNOSIS — W19XXXA Unspecified fall, initial encounter: Secondary | ICD-10-CM | POA: Diagnosis not present

## 2018-03-11 DIAGNOSIS — S0083XA Contusion of other part of head, initial encounter: Secondary | ICD-10-CM | POA: Diagnosis not present

## 2018-03-11 DIAGNOSIS — S0990XA Unspecified injury of head, initial encounter: Secondary | ICD-10-CM | POA: Diagnosis not present

## 2018-03-11 DIAGNOSIS — Z79899 Other long term (current) drug therapy: Secondary | ICD-10-CM | POA: Insufficient documentation

## 2018-03-11 DIAGNOSIS — E039 Hypothyroidism, unspecified: Secondary | ICD-10-CM | POA: Insufficient documentation

## 2018-03-11 NOTE — ED Triage Notes (Signed)
[facility] in [facility] the [facility] and was walking with walker. Pt lost her balance and fell onto floor, pt has hx of fall, states looses her balance frequently. Pt has abrasion and hematoma to right forehead, no other apparent injury. Pt has stood up since fall.

## 2018-03-12 ENCOUNTER — Emergency Department
Admission: EM | Admit: 2018-03-12 | Discharge: 2018-03-12 | Disposition: A | Discharge status: Home / Self Care | Payer: Medicare HMO | Attending: Emergency Medicine | Admitting: Emergency Medicine

## 2018-03-12 DIAGNOSIS — S0990XA Unspecified injury of head, initial encounter: Secondary | ICD-10-CM

## 2018-03-12 DIAGNOSIS — H903 Sensorineural hearing loss, bilateral: Secondary | ICD-10-CM | POA: Diagnosis not present

## 2018-03-12 DIAGNOSIS — T148XXA Other injury of unspecified body region, initial encounter: Secondary | ICD-10-CM

## 2018-03-12 MED ORDER — BACITRACIN ZINC 500 UNIT/GM EX OINT
TOPICAL_OINTMENT | CUTANEOUS | Status: AC
  Filled 2018-03-12: qty 0.9

## 2018-03-12 NOTE — ED Notes (Signed)
See paper charting. Pt D/C during system downtime.

## 2018-03-12 NOTE — ED Provider Notes (Signed)
[facility] Emergency Department Provider Note _______________   First MD Initiated Contact with Patient 03/12/18 0157     (approximate)  I have reviewed the triage vital signs and the nursing notes.   HISTORY  Chief Complaint Fall    HPI Carrie Calhoun is a 85 y.o. female presents to the emergency department following accidental fall.  Patient states that she walks with a walker at baseline and falls frequently left secondary to losing her balance.  Today the patient fell striking her right forehead.  Patient denies any loss of consciousness.  Patient denies any other complaints.   Past Medical History:  Diagnosis Date  . Anemia, unspecified   . Anxiety   . Atrioventricular block 06/09/2008   1st degree with bradycardia  . Cardiac arrest (HCC)   . Chicken pox   . CKD (chronic kidney disease) stage 3, GFR 30-59 ml/min (HCC)   . Diverticulosis   . History of cardiac arrest    during prior cataract surgery, thought to be from anesthesia, per pt. Followed by Dr. Darrold Junker  . Hyperlipidemia, unspecified   . Hypertension   . Hypothyroidism, unspecified   . Osteopenia   . Renal disorder     Patient Active Problem List   Diagnosis Date Noted  . DNR (do not resuscitate) 09/12/2016  . Hip fracture (HCC) 09/10/2016  . Osteopenia 09/02/2016  . Hyperlipidemia, unspecified 09/02/2016  . DDD (degenerative disc disease), lumbar 09/02/2016  . CKD (chronic kidney disease), stage III (HCC) 09/02/2016  . Anemia, unspecified 09/02/2016  . Ataxia 07/22/2016  . Dementia arising in the senium and presenium (HCC) 07/15/2016  . Multiple rib fractures 07/04/2016  . Moderate major depression, single episode (HCC) 06/14/2016  . Difficulty walking 01/08/2016  . Hypothyroidism due to acquired atrophy of thyroid 07/21/2015  . Paresthesia of both hands 06/28/2014  . Bilateral carpal tunnel syndrome 06/28/2014  . Tremor 12/28/2013  . Restless legs 12/28/2013  .  Loss of memory 12/28/2013  . Essential hypertension 12/28/2013    Past Surgical History:  Procedure Laterality Date  . APPENDECTOMY  1970  . CATARACT EXTRACTION, BILATERAL    . COLONOSCOPY  04/29/2006  . FOOT SURGERY Bilateral   . INTRAMEDULLARY (IM) NAIL INTERTROCHANTERIC Right 09/10/2016   Procedure: INTRAMEDULLARY (IM) NAIL INTERTROCHANTRIC;  Surgeon: [name], MD;  Location: [facility] ORS;  Service: Orthopedics;  Laterality: Right;  . TUBAL LIGATION Bilateral 1970    Prior to Admission medications   Medication Sig Start Date End Date Taking? Authorizing Provider  acetaminophen (TYLENOL) 325 MG tablet Take 2 tablets (650 mg total) by mouth every 6 (six) hours as needed for mild pain (or Fever >/= 101). 07/08/16   Milagros Loll, MD  acetaminophen (TYLENOL) 325 MG tablet Take 650 mg by mouth 4 (four) times daily.    [provider]  aspirin EC 81 MG tablet Take 1 tablet by mouth daily.    [provider]  busPIRone (BUSPAR) 15 MG tablet Take 1 tablet by mouth daily.  05/13/16   [provider]  Calcium Carbonate-Vitamin D 600-400 MG-UNIT tablet Take 1 tablet by mouth daily.     [provider]  citalopram (CELEXA) 10 MG tablet Take 1 tablet (10 mg total) by mouth every other day. 07/08/16   Milagros Loll, MD  docusate sodium (COLACE) 100 MG capsule Take 1 capsule (100 mg total) by mouth 2 (two) times daily. Patient not taking: Reported on 11/18/2016 09/13/16   Milagros Loll, MD  donepezil (  ARICEPT) 5 MG tablet Take 1 tablet by mouth daily.  05/13/16   [provider]  levothyroxine (SYNTHROID, LEVOTHROID) 75 MCG tablet Take 1 tablet by mouth daily before breakfast.  03/11/16   [provider]  Lifitegrast 5 % SOLN Place 1 drop into both eyes 2 (two) times daily.     [provider]  methocarbamol (ROBAXIN) 500 MG tablet Take 1 tablet (500 mg total) by mouth every 6 (six) hours as needed for muscle spasms. Patient not taking:  Reported on 11/18/2016 09/13/16   Milagros LollSudini, Srikar, MD  mirabegron ER (MYRBETRIQ) 25 MG TB24 tablet Take 1 tablet (25 mg total) by mouth daily. Patient not taking: Reported on 11/18/2016 09/02/16   Michiel CowboyMcGowan, Shannon A, PA-C  Multiple Vitamin (MULTIVITAMIN) tablet Take 1 tablet by mouth daily.    [provider]  Omega-3 Fatty Acids (FISH OIL PO) Take 1 tablet by mouth daily.    [provider]  traMADol (ULTRAM) 50 MG tablet Take 1 tablet (50 mg total) by mouth every 4 (four) hours as needed. Patient not taking: Reported on 11/18/2016 10/14/16   Lorenso QuarryLeach, Shannon, NP  traMADol (ULTRAM) 50 MG tablet Take 1 tablet (50 mg total) by mouth 4 (four) times daily. Scheduled for pain. Ok to give prn doses in addition to scheduled for severe pain Patient not taking: Reported on 11/18/2016 10/14/16   Lorenso QuarryLeach, Shannon, NP    Allergies Alendronate; Lidocaine; Phenylephrine; Phenyltoloxamine; and Azithromycin  Family History  Problem Relation Age of Onset  . Hypertension Mother   . Stroke Mother   . Osteoporosis Mother   . Thyroid disease Mother   . Breast cancer Maternal Aunt   . Breast cancer Maternal Aunt     Social History Social History   Tobacco Use  . Smoking status: Never Smoker  . Smokeless tobacco: Never Used  Substance Use Topics  . Alcohol use: No  . Drug use: No    Review of Systems Constitutional: No fever/chills Eyes: No visual changes. ENT: No sore throat. Cardiovascular: Denies chest pain. Respiratory: Denies shortness of breath. Gastrointestinal: No abdominal pain.  No nausea, no vomiting.  No diarrhea.  No constipation. Genitourinary: Negative for dysuria. Musculoskeletal: Negative for neck pain.  Negative for back pain. Integumentary: Positive for abrasion to the right forehead Neurological: Negative for headaches, focal weakness or numbness.  ____________________________________________   PHYSICAL EXAM:  VITAL SIGNS: ED Triage Vitals [03/11/18 2149]  Enc  Vitals Group     BP (!) 148/76     Pulse Rate (!) 52     Resp 20     Temp 98 F (36.7 C)     Temp Source Oral     SpO2 100 %     Weight 59 kg (130 lb)     Height      Head Circumference      Peak Flow      Pain Score      Pain Loc      Pain Edu?      Excl. in GC?     Constitutional: Alert and oriented. Well appearing and in no acute distress. Eyes: Conjunctivae are normal. PERRL. EOMI. Head: Abrasion to the right forehead Mouth/Throat: Mucous membranes are moist.  Oropharynx non-erythematous. Neck: No stridor.  No cervical spine tenderness to palpation. Cardiovascular: Normal rate, regular rhythm. Good peripheral circulation. Grossly normal heart sounds. Respiratory: Normal respiratory effort.  No retractions. Lungs CTAB. Gastrointestinal: Soft and nontender. No distention.  Musculoskeletal: No lower extremity tenderness  nor edema. No gross deformities of extremities. Neurologic:  Normal speech and language. No gross focal neurologic deficits are appreciated.  Skin:  Skin is warm, dry and intact. No rash noted.   ______________ RADIOLOGY I, Darci CurrentANDOLPH N BROWN, personally viewed and evaluated these images (plain radiographs) as part of my medical decision making, as well as reviewing the written report by the radiologist.  ED MD interpretation: CT head and cervical spine revealed no acute abnormality per radiologist.  Official radiology report(s): Ct Head Wo Contrast  Result Date: 03/11/2018 CLINICAL DATA:  Larey SeatFell onto floor hematoma to right forehead EXAM: CT HEAD WITHOUT CONTRAST CT CERVICAL SPINE WITHOUT CONTRAST TECHNIQUE: Multidetector CT imaging of the head and cervical spine was performed following the standard protocol without intravenous contrast. Multiplanar CT image reconstructions of the cervical spine were also generated. COMPARISON:  CT 09/14/2017, 07/13/2017 FINDINGS: CT HEAD FINDINGS Brain: No acute territorial infarction, hemorrhage, or intracranial mass. Mild  atrophy. Periventricular and subcortical white matter hypodensity consistent with small vessel ischemic change. Stable ventricle size. Vascular: No hyperdense vessels.  Carotid vascular calcification Skull: Normal. Negative for fracture or focal lesion. Sinuses/Orbits: No acute finding. Other: Small right forehead scalp soft tissue swelling. CT CERVICAL SPINE FINDINGS Alignment: Reversal of cervical lordosis. No subluxation. Facet alignment within normal limits. Skull base and vertebrae: No acute fracture. No primary bone lesion or focal pathologic process. Stable sclerosis in the C4 and T1. Soft tissues and spinal canal: No prevertebral fluid or swelling. No visible canal hematoma. Disc levels: Moderate degenerative changes at C2-C3 and C3-C4 and C6-C7 with marked degenerative change at C5-C6. Multiple level bilateral facet degenerative change. Prominent degenerative changes left side C1-C2 articulation. Upper chest: Mild pleural and parenchymal scarring at the apices. Other: None IMPRESSION: 1. No CT evidence for acute intracranial abnormality. Atrophy and small vessel ischemic changes 2. Reversal of cervical lordosis with degenerative changes. No acute osseous abnormality. Electronically Signed   By: Jasmine PangKim  Fujinaga M.D.   On: 03/11/2018 22:59   Ct Cervical Spine Wo Contrast  Result Date: 03/11/2018 CLINICAL DATA:  Larey SeatFell onto floor hematoma to right forehead EXAM: CT HEAD WITHOUT CONTRAST CT CERVICAL SPINE WITHOUT CONTRAST TECHNIQUE: Multidetector CT imaging of the head and cervical spine was performed following the standard protocol without intravenous contrast. Multiplanar CT image reconstructions of the cervical spine were also generated. COMPARISON:  CT 09/14/2017, 07/13/2017 FINDINGS: CT HEAD FINDINGS Brain: No acute territorial infarction, hemorrhage, or intracranial mass. Mild atrophy. Periventricular and subcortical white matter hypodensity consistent with small vessel ischemic change. Stable ventricle  size. Vascular: No hyperdense vessels.  Carotid vascular calcification Skull: Normal. Negative for fracture or focal lesion. Sinuses/Orbits: No acute finding. Other: Small right forehead scalp soft tissue swelling. CT CERVICAL SPINE FINDINGS Alignment: Reversal of cervical lordosis. No subluxation. Facet alignment within normal limits. Skull base and vertebrae: No acute fracture. No primary bone lesion or focal pathologic process. Stable sclerosis in the C4 and T1. Soft tissues and spinal canal: No prevertebral fluid or swelling. No visible canal hematoma. Disc levels: Moderate degenerative changes at C2-C3 and C3-C4 and C6-C7 with marked degenerative change at C5-C6. Multiple level bilateral facet degenerative change. Prominent degenerative changes left side C1-C2 articulation. Upper chest: Mild pleural and parenchymal scarring at the apices. Other: None IMPRESSION: 1. No CT evidence for acute intracranial abnormality. Atrophy and small vessel ischemic changes 2. Reversal of cervical lordosis with degenerative changes. No acute osseous abnormality. Electronically Signed   By: Jasmine PangKim  Fujinaga M.D.   On: 03/11/2018  22:59    ____________________________________________    Procedures   ____________________________________________   INITIAL IMPRESSION / ASSESSMENT AND PLAN / ED COURSE  As part of my medical decision making, I reviewed the following data within the electronic MEDICAL RECORD NUMBER   85 year old female presenting to the emergency department secondary to accidental fall with resultant abrasion to the right forehead and contusion.  CT scan revealed no acute findings intracranially or any acute cervical spine abnormality.  Patient's abrasion dressed with antibiotic ointment and gauze. ____________________________________________  FINAL CLINICAL IMPRESSION(S) / ED DIAGNOSES  Final diagnoses:  Injury of head, initial encounter  Abrasion     MEDICATIONS GIVEN DURING THIS  VISIT:  Medications  bacitracin 500 UNIT/GM ointment (has no administration in time range)     ED Discharge Orders    None       Note:  This document was prepared using Dragon voice recognition software and may include unintentional dictation errors.    Darci Current, MD 03/12/18 [id number]

## 2018-03-19 DIAGNOSIS — I69354 Hemiplegia and hemiparesis following cerebral infarction affecting left non-dominant side: Secondary | ICD-10-CM | POA: Diagnosis not present

## 2018-03-19 DIAGNOSIS — M5136 Other intervertebral disc degeneration, lumbar region: Secondary | ICD-10-CM | POA: Diagnosis not present

## 2018-03-19 DIAGNOSIS — S0990XD Unspecified injury of head, subsequent encounter: Secondary | ICD-10-CM | POA: Diagnosis not present

## 2018-03-19 DIAGNOSIS — F0280 Dementia in other diseases classified elsewhere without behavioral disturbance: Secondary | ICD-10-CM | POA: Diagnosis not present

## 2018-03-19 DIAGNOSIS — N183 Chronic kidney disease, stage 3 (moderate): Secondary | ICD-10-CM | POA: Diagnosis not present

## 2018-03-19 DIAGNOSIS — I129 Hypertensive chronic kidney disease with stage 1 through stage 4 chronic kidney disease, or unspecified chronic kidney disease: Secondary | ICD-10-CM | POA: Diagnosis not present

## 2018-03-19 DIAGNOSIS — G309 Alzheimer's disease, unspecified: Secondary | ICD-10-CM | POA: Diagnosis not present

## 2018-03-19 DIAGNOSIS — D631 Anemia in chronic kidney disease: Secondary | ICD-10-CM | POA: Diagnosis not present

## 2018-03-19 DIAGNOSIS — S0083XD Contusion of other part of head, subsequent encounter: Secondary | ICD-10-CM | POA: Diagnosis not present

## 2018-03-23 DIAGNOSIS — M47812 Spondylosis without myelopathy or radiculopathy, cervical region: Secondary | ICD-10-CM | POA: Diagnosis not present

## 2018-03-24 DIAGNOSIS — G309 Alzheimer's disease, unspecified: Secondary | ICD-10-CM | POA: Diagnosis not present

## 2018-03-24 DIAGNOSIS — I69354 Hemiplegia and hemiparesis following cerebral infarction affecting left non-dominant side: Secondary | ICD-10-CM | POA: Diagnosis not present

## 2018-03-24 DIAGNOSIS — I129 Hypertensive chronic kidney disease with stage 1 through stage 4 chronic kidney disease, or unspecified chronic kidney disease: Secondary | ICD-10-CM | POA: Diagnosis not present

## 2018-03-24 DIAGNOSIS — N183 Chronic kidney disease, stage 3 (moderate): Secondary | ICD-10-CM | POA: Diagnosis not present

## 2018-03-24 DIAGNOSIS — S0990XD Unspecified injury of head, subsequent encounter: Secondary | ICD-10-CM | POA: Diagnosis not present

## 2018-03-24 DIAGNOSIS — F0280 Dementia in other diseases classified elsewhere without behavioral disturbance: Secondary | ICD-10-CM | POA: Diagnosis not present

## 2018-03-24 DIAGNOSIS — M5136 Other intervertebral disc degeneration, lumbar region: Secondary | ICD-10-CM | POA: Diagnosis not present

## 2018-03-24 DIAGNOSIS — S0083XD Contusion of other part of head, subsequent encounter: Secondary | ICD-10-CM | POA: Diagnosis not present

## 2018-03-24 DIAGNOSIS — D631 Anemia in chronic kidney disease: Secondary | ICD-10-CM | POA: Diagnosis not present

## 2018-03-25 DIAGNOSIS — S0083XD Contusion of other part of head, subsequent encounter: Secondary | ICD-10-CM | POA: Diagnosis not present

## 2018-03-25 DIAGNOSIS — F0280 Dementia in other diseases classified elsewhere without behavioral disturbance: Secondary | ICD-10-CM | POA: Diagnosis not present

## 2018-03-25 DIAGNOSIS — D631 Anemia in chronic kidney disease: Secondary | ICD-10-CM | POA: Diagnosis not present

## 2018-03-25 DIAGNOSIS — I69354 Hemiplegia and hemiparesis following cerebral infarction affecting left non-dominant side: Secondary | ICD-10-CM | POA: Diagnosis not present

## 2018-03-25 DIAGNOSIS — N183 Chronic kidney disease, stage 3 (moderate): Secondary | ICD-10-CM | POA: Diagnosis not present

## 2018-03-25 DIAGNOSIS — S0990XD Unspecified injury of head, subsequent encounter: Secondary | ICD-10-CM | POA: Diagnosis not present

## 2018-03-25 DIAGNOSIS — I129 Hypertensive chronic kidney disease with stage 1 through stage 4 chronic kidney disease, or unspecified chronic kidney disease: Secondary | ICD-10-CM | POA: Diagnosis not present

## 2018-03-25 DIAGNOSIS — M5136 Other intervertebral disc degeneration, lumbar region: Secondary | ICD-10-CM | POA: Diagnosis not present

## 2018-03-25 DIAGNOSIS — G309 Alzheimer's disease, unspecified: Secondary | ICD-10-CM | POA: Diagnosis not present

## 2018-04-01 DIAGNOSIS — G309 Alzheimer's disease, unspecified: Secondary | ICD-10-CM | POA: Diagnosis not present

## 2018-04-01 DIAGNOSIS — M5136 Other intervertebral disc degeneration, lumbar region: Secondary | ICD-10-CM | POA: Diagnosis not present

## 2018-04-01 DIAGNOSIS — F0280 Dementia in other diseases classified elsewhere without behavioral disturbance: Secondary | ICD-10-CM | POA: Diagnosis not present

## 2018-04-01 DIAGNOSIS — N183 Chronic kidney disease, stage 3 (moderate): Secondary | ICD-10-CM | POA: Diagnosis not present

## 2018-04-01 DIAGNOSIS — I69354 Hemiplegia and hemiparesis following cerebral infarction affecting left non-dominant side: Secondary | ICD-10-CM | POA: Diagnosis not present

## 2018-04-01 DIAGNOSIS — S0990XD Unspecified injury of head, subsequent encounter: Secondary | ICD-10-CM | POA: Diagnosis not present

## 2018-04-01 DIAGNOSIS — D631 Anemia in chronic kidney disease: Secondary | ICD-10-CM | POA: Diagnosis not present

## 2018-04-01 DIAGNOSIS — I129 Hypertensive chronic kidney disease with stage 1 through stage 4 chronic kidney disease, or unspecified chronic kidney disease: Secondary | ICD-10-CM | POA: Diagnosis not present

## 2018-04-01 DIAGNOSIS — S0083XD Contusion of other part of head, subsequent encounter: Secondary | ICD-10-CM | POA: Diagnosis not present

## 2018-04-02 DIAGNOSIS — S0990XD Unspecified injury of head, subsequent encounter: Secondary | ICD-10-CM | POA: Diagnosis not present

## 2018-04-02 DIAGNOSIS — S0083XD Contusion of other part of head, subsequent encounter: Secondary | ICD-10-CM | POA: Diagnosis not present

## 2018-04-02 DIAGNOSIS — G309 Alzheimer's disease, unspecified: Secondary | ICD-10-CM | POA: Diagnosis not present

## 2018-04-02 DIAGNOSIS — D631 Anemia in chronic kidney disease: Secondary | ICD-10-CM | POA: Diagnosis not present

## 2018-04-02 DIAGNOSIS — F0280 Dementia in other diseases classified elsewhere without behavioral disturbance: Secondary | ICD-10-CM | POA: Diagnosis not present

## 2018-04-02 DIAGNOSIS — I69354 Hemiplegia and hemiparesis following cerebral infarction affecting left non-dominant side: Secondary | ICD-10-CM | POA: Diagnosis not present

## 2018-04-02 DIAGNOSIS — N183 Chronic kidney disease, stage 3 (moderate): Secondary | ICD-10-CM | POA: Diagnosis not present

## 2018-04-02 DIAGNOSIS — M5136 Other intervertebral disc degeneration, lumbar region: Secondary | ICD-10-CM | POA: Diagnosis not present

## 2018-04-02 DIAGNOSIS — I129 Hypertensive chronic kidney disease with stage 1 through stage 4 chronic kidney disease, or unspecified chronic kidney disease: Secondary | ICD-10-CM | POA: Diagnosis not present

## 2018-04-06 DIAGNOSIS — F0280 Dementia in other diseases classified elsewhere without behavioral disturbance: Secondary | ICD-10-CM | POA: Diagnosis not present

## 2018-04-06 DIAGNOSIS — S0083XD Contusion of other part of head, subsequent encounter: Secondary | ICD-10-CM | POA: Diagnosis not present

## 2018-04-06 DIAGNOSIS — G309 Alzheimer's disease, unspecified: Secondary | ICD-10-CM | POA: Diagnosis not present

## 2018-04-06 DIAGNOSIS — M5136 Other intervertebral disc degeneration, lumbar region: Secondary | ICD-10-CM | POA: Diagnosis not present

## 2018-04-06 DIAGNOSIS — D631 Anemia in chronic kidney disease: Secondary | ICD-10-CM | POA: Diagnosis not present

## 2018-04-06 DIAGNOSIS — S0990XD Unspecified injury of head, subsequent encounter: Secondary | ICD-10-CM | POA: Diagnosis not present

## 2018-04-06 DIAGNOSIS — I69354 Hemiplegia and hemiparesis following cerebral infarction affecting left non-dominant side: Secondary | ICD-10-CM | POA: Diagnosis not present

## 2018-04-06 DIAGNOSIS — M47812 Spondylosis without myelopathy or radiculopathy, cervical region: Secondary | ICD-10-CM | POA: Diagnosis not present

## 2018-04-06 DIAGNOSIS — I129 Hypertensive chronic kidney disease with stage 1 through stage 4 chronic kidney disease, or unspecified chronic kidney disease: Secondary | ICD-10-CM | POA: Diagnosis not present

## 2018-04-06 DIAGNOSIS — N183 Chronic kidney disease, stage 3 (moderate): Secondary | ICD-10-CM | POA: Diagnosis not present

## 2018-04-07 DIAGNOSIS — D631 Anemia in chronic kidney disease: Secondary | ICD-10-CM | POA: Diagnosis not present

## 2018-04-07 DIAGNOSIS — I129 Hypertensive chronic kidney disease with stage 1 through stage 4 chronic kidney disease, or unspecified chronic kidney disease: Secondary | ICD-10-CM | POA: Diagnosis not present

## 2018-04-07 DIAGNOSIS — I69354 Hemiplegia and hemiparesis following cerebral infarction affecting left non-dominant side: Secondary | ICD-10-CM | POA: Diagnosis not present

## 2018-04-07 DIAGNOSIS — S0083XD Contusion of other part of head, subsequent encounter: Secondary | ICD-10-CM | POA: Diagnosis not present

## 2018-04-07 DIAGNOSIS — G309 Alzheimer's disease, unspecified: Secondary | ICD-10-CM | POA: Diagnosis not present

## 2018-04-07 DIAGNOSIS — S0990XD Unspecified injury of head, subsequent encounter: Secondary | ICD-10-CM | POA: Diagnosis not present

## 2018-04-07 DIAGNOSIS — F0280 Dementia in other diseases classified elsewhere without behavioral disturbance: Secondary | ICD-10-CM | POA: Diagnosis not present

## 2018-04-07 DIAGNOSIS — M5136 Other intervertebral disc degeneration, lumbar region: Secondary | ICD-10-CM | POA: Diagnosis not present

## 2018-04-07 DIAGNOSIS — N183 Chronic kidney disease, stage 3 (moderate): Secondary | ICD-10-CM | POA: Diagnosis not present

## 2018-04-08 DIAGNOSIS — S0990XD Unspecified injury of head, subsequent encounter: Secondary | ICD-10-CM | POA: Diagnosis not present

## 2018-04-08 DIAGNOSIS — I69354 Hemiplegia and hemiparesis following cerebral infarction affecting left non-dominant side: Secondary | ICD-10-CM | POA: Diagnosis not present

## 2018-04-08 DIAGNOSIS — M5136 Other intervertebral disc degeneration, lumbar region: Secondary | ICD-10-CM | POA: Diagnosis not present

## 2018-04-08 DIAGNOSIS — F0280 Dementia in other diseases classified elsewhere without behavioral disturbance: Secondary | ICD-10-CM | POA: Diagnosis not present

## 2018-04-08 DIAGNOSIS — I129 Hypertensive chronic kidney disease with stage 1 through stage 4 chronic kidney disease, or unspecified chronic kidney disease: Secondary | ICD-10-CM | POA: Diagnosis not present

## 2018-04-08 DIAGNOSIS — G309 Alzheimer's disease, unspecified: Secondary | ICD-10-CM | POA: Diagnosis not present

## 2018-04-08 DIAGNOSIS — S0083XD Contusion of other part of head, subsequent encounter: Secondary | ICD-10-CM | POA: Diagnosis not present

## 2018-04-08 DIAGNOSIS — D631 Anemia in chronic kidney disease: Secondary | ICD-10-CM | POA: Diagnosis not present

## 2018-04-08 DIAGNOSIS — N183 Chronic kidney disease, stage 3 (moderate): Secondary | ICD-10-CM | POA: Diagnosis not present

## 2018-04-13 DIAGNOSIS — S0990XD Unspecified injury of head, subsequent encounter: Secondary | ICD-10-CM | POA: Diagnosis not present

## 2018-04-13 DIAGNOSIS — S0083XD Contusion of other part of head, subsequent encounter: Secondary | ICD-10-CM | POA: Diagnosis not present

## 2018-04-13 DIAGNOSIS — I69354 Hemiplegia and hemiparesis following cerebral infarction affecting left non-dominant side: Secondary | ICD-10-CM | POA: Diagnosis not present

## 2018-04-13 DIAGNOSIS — M5136 Other intervertebral disc degeneration, lumbar region: Secondary | ICD-10-CM | POA: Diagnosis not present

## 2018-04-13 DIAGNOSIS — G309 Alzheimer's disease, unspecified: Secondary | ICD-10-CM | POA: Diagnosis not present

## 2018-04-13 DIAGNOSIS — D631 Anemia in chronic kidney disease: Secondary | ICD-10-CM | POA: Diagnosis not present

## 2018-04-13 DIAGNOSIS — N183 Chronic kidney disease, stage 3 (moderate): Secondary | ICD-10-CM | POA: Diagnosis not present

## 2018-04-13 DIAGNOSIS — I129 Hypertensive chronic kidney disease with stage 1 through stage 4 chronic kidney disease, or unspecified chronic kidney disease: Secondary | ICD-10-CM | POA: Diagnosis not present

## 2018-04-13 DIAGNOSIS — F0280 Dementia in other diseases classified elsewhere without behavioral disturbance: Secondary | ICD-10-CM | POA: Diagnosis not present

## 2018-04-14 DIAGNOSIS — S0083XD Contusion of other part of head, subsequent encounter: Secondary | ICD-10-CM | POA: Diagnosis not present

## 2018-04-14 DIAGNOSIS — G309 Alzheimer's disease, unspecified: Secondary | ICD-10-CM | POA: Diagnosis not present

## 2018-04-14 DIAGNOSIS — S0990XD Unspecified injury of head, subsequent encounter: Secondary | ICD-10-CM | POA: Diagnosis not present

## 2018-04-14 DIAGNOSIS — I129 Hypertensive chronic kidney disease with stage 1 through stage 4 chronic kidney disease, or unspecified chronic kidney disease: Secondary | ICD-10-CM | POA: Diagnosis not present

## 2018-04-14 DIAGNOSIS — I69354 Hemiplegia and hemiparesis following cerebral infarction affecting left non-dominant side: Secondary | ICD-10-CM | POA: Diagnosis not present

## 2018-04-14 DIAGNOSIS — M5136 Other intervertebral disc degeneration, lumbar region: Secondary | ICD-10-CM | POA: Diagnosis not present

## 2018-04-14 DIAGNOSIS — F0280 Dementia in other diseases classified elsewhere without behavioral disturbance: Secondary | ICD-10-CM | POA: Diagnosis not present

## 2018-04-14 DIAGNOSIS — D631 Anemia in chronic kidney disease: Secondary | ICD-10-CM | POA: Diagnosis not present

## 2018-04-14 DIAGNOSIS — N183 Chronic kidney disease, stage 3 (moderate): Secondary | ICD-10-CM | POA: Diagnosis not present

## 2018-04-16 DIAGNOSIS — G309 Alzheimer's disease, unspecified: Secondary | ICD-10-CM | POA: Diagnosis not present

## 2018-04-16 DIAGNOSIS — N183 Chronic kidney disease, stage 3 (moderate): Secondary | ICD-10-CM | POA: Diagnosis not present

## 2018-04-16 DIAGNOSIS — S0990XD Unspecified injury of head, subsequent encounter: Secondary | ICD-10-CM | POA: Diagnosis not present

## 2018-04-16 DIAGNOSIS — F0280 Dementia in other diseases classified elsewhere without behavioral disturbance: Secondary | ICD-10-CM | POA: Diagnosis not present

## 2018-04-16 DIAGNOSIS — I69354 Hemiplegia and hemiparesis following cerebral infarction affecting left non-dominant side: Secondary | ICD-10-CM | POA: Diagnosis not present

## 2018-04-16 DIAGNOSIS — D631 Anemia in chronic kidney disease: Secondary | ICD-10-CM | POA: Diagnosis not present

## 2018-04-16 DIAGNOSIS — I129 Hypertensive chronic kidney disease with stage 1 through stage 4 chronic kidney disease, or unspecified chronic kidney disease: Secondary | ICD-10-CM | POA: Diagnosis not present

## 2018-04-16 DIAGNOSIS — S0083XD Contusion of other part of head, subsequent encounter: Secondary | ICD-10-CM | POA: Diagnosis not present

## 2018-04-16 DIAGNOSIS — M5136 Other intervertebral disc degeneration, lumbar region: Secondary | ICD-10-CM | POA: Diagnosis not present

## 2018-04-20 DIAGNOSIS — M5412 Radiculopathy, cervical region: Secondary | ICD-10-CM | POA: Diagnosis not present

## 2018-04-20 DIAGNOSIS — M62838 Other muscle spasm: Secondary | ICD-10-CM | POA: Diagnosis not present

## 2018-04-20 DIAGNOSIS — M5030 Other cervical disc degeneration, unspecified cervical region: Secondary | ICD-10-CM | POA: Diagnosis not present

## 2018-04-20 DIAGNOSIS — M47812 Spondylosis without myelopathy or radiculopathy, cervical region: Secondary | ICD-10-CM | POA: Diagnosis not present

## 2018-05-18 DIAGNOSIS — D631 Anemia in chronic kidney disease: Secondary | ICD-10-CM | POA: Diagnosis not present

## 2018-05-18 DIAGNOSIS — M1990 Unspecified osteoarthritis, unspecified site: Secondary | ICD-10-CM | POA: Diagnosis not present

## 2018-05-18 DIAGNOSIS — M47812 Spondylosis without myelopathy or radiculopathy, cervical region: Secondary | ICD-10-CM | POA: Diagnosis not present

## 2018-05-18 DIAGNOSIS — G309 Alzheimer's disease, unspecified: Secondary | ICD-10-CM | POA: Diagnosis not present

## 2018-05-18 DIAGNOSIS — M5136 Other intervertebral disc degeneration, lumbar region: Secondary | ICD-10-CM | POA: Diagnosis not present

## 2018-05-18 DIAGNOSIS — I129 Hypertensive chronic kidney disease with stage 1 through stage 4 chronic kidney disease, or unspecified chronic kidney disease: Secondary | ICD-10-CM | POA: Diagnosis not present

## 2018-05-18 DIAGNOSIS — F0280 Dementia in other diseases classified elsewhere without behavioral disturbance: Secondary | ICD-10-CM | POA: Diagnosis not present

## 2018-05-18 DIAGNOSIS — N183 Chronic kidney disease, stage 3 (moderate): Secondary | ICD-10-CM | POA: Diagnosis not present

## 2018-05-18 DIAGNOSIS — I69354 Hemiplegia and hemiparesis following cerebral infarction affecting left non-dominant side: Secondary | ICD-10-CM | POA: Diagnosis not present

## 2018-05-19 DIAGNOSIS — N183 Chronic kidney disease, stage 3 (moderate): Secondary | ICD-10-CM | POA: Diagnosis not present

## 2018-05-19 DIAGNOSIS — M1990 Unspecified osteoarthritis, unspecified site: Secondary | ICD-10-CM | POA: Diagnosis not present

## 2018-05-19 DIAGNOSIS — D631 Anemia in chronic kidney disease: Secondary | ICD-10-CM | POA: Diagnosis not present

## 2018-05-19 DIAGNOSIS — I129 Hypertensive chronic kidney disease with stage 1 through stage 4 chronic kidney disease, or unspecified chronic kidney disease: Secondary | ICD-10-CM | POA: Diagnosis not present

## 2018-05-19 DIAGNOSIS — G309 Alzheimer's disease, unspecified: Secondary | ICD-10-CM | POA: Diagnosis not present

## 2018-05-19 DIAGNOSIS — I69354 Hemiplegia and hemiparesis following cerebral infarction affecting left non-dominant side: Secondary | ICD-10-CM | POA: Diagnosis not present

## 2018-05-19 DIAGNOSIS — M47812 Spondylosis without myelopathy or radiculopathy, cervical region: Secondary | ICD-10-CM | POA: Diagnosis not present

## 2018-05-19 DIAGNOSIS — M5136 Other intervertebral disc degeneration, lumbar region: Secondary | ICD-10-CM | POA: Diagnosis not present

## 2018-05-19 DIAGNOSIS — F0280 Dementia in other diseases classified elsewhere without behavioral disturbance: Secondary | ICD-10-CM | POA: Diagnosis not present

## 2018-05-21 DIAGNOSIS — M47812 Spondylosis without myelopathy or radiculopathy, cervical region: Secondary | ICD-10-CM | POA: Diagnosis not present

## 2018-05-21 DIAGNOSIS — F0280 Dementia in other diseases classified elsewhere without behavioral disturbance: Secondary | ICD-10-CM | POA: Diagnosis not present

## 2018-05-21 DIAGNOSIS — M5136 Other intervertebral disc degeneration, lumbar region: Secondary | ICD-10-CM | POA: Diagnosis not present

## 2018-05-21 DIAGNOSIS — N183 Chronic kidney disease, stage 3 (moderate): Secondary | ICD-10-CM | POA: Diagnosis not present

## 2018-05-21 DIAGNOSIS — D631 Anemia in chronic kidney disease: Secondary | ICD-10-CM | POA: Diagnosis not present

## 2018-05-21 DIAGNOSIS — I69354 Hemiplegia and hemiparesis following cerebral infarction affecting left non-dominant side: Secondary | ICD-10-CM | POA: Diagnosis not present

## 2018-05-21 DIAGNOSIS — G309 Alzheimer's disease, unspecified: Secondary | ICD-10-CM | POA: Diagnosis not present

## 2018-05-21 DIAGNOSIS — I129 Hypertensive chronic kidney disease with stage 1 through stage 4 chronic kidney disease, or unspecified chronic kidney disease: Secondary | ICD-10-CM | POA: Diagnosis not present

## 2018-05-21 DIAGNOSIS — M1990 Unspecified osteoarthritis, unspecified site: Secondary | ICD-10-CM | POA: Diagnosis not present

## 2018-05-25 DIAGNOSIS — D631 Anemia in chronic kidney disease: Secondary | ICD-10-CM | POA: Diagnosis not present

## 2018-05-25 DIAGNOSIS — M5136 Other intervertebral disc degeneration, lumbar region: Secondary | ICD-10-CM | POA: Diagnosis not present

## 2018-05-25 DIAGNOSIS — N183 Chronic kidney disease, stage 3 (moderate): Secondary | ICD-10-CM | POA: Diagnosis not present

## 2018-05-25 DIAGNOSIS — G309 Alzheimer's disease, unspecified: Secondary | ICD-10-CM | POA: Diagnosis not present

## 2018-05-25 DIAGNOSIS — M1990 Unspecified osteoarthritis, unspecified site: Secondary | ICD-10-CM | POA: Diagnosis not present

## 2018-05-25 DIAGNOSIS — F0280 Dementia in other diseases classified elsewhere without behavioral disturbance: Secondary | ICD-10-CM | POA: Diagnosis not present

## 2018-05-25 DIAGNOSIS — I69354 Hemiplegia and hemiparesis following cerebral infarction affecting left non-dominant side: Secondary | ICD-10-CM | POA: Diagnosis not present

## 2018-05-25 DIAGNOSIS — I129 Hypertensive chronic kidney disease with stage 1 through stage 4 chronic kidney disease, or unspecified chronic kidney disease: Secondary | ICD-10-CM | POA: Diagnosis not present

## 2018-05-25 DIAGNOSIS — M47812 Spondylosis without myelopathy or radiculopathy, cervical region: Secondary | ICD-10-CM | POA: Diagnosis not present

## 2018-05-26 DIAGNOSIS — G309 Alzheimer's disease, unspecified: Secondary | ICD-10-CM | POA: Diagnosis not present

## 2018-05-26 DIAGNOSIS — M47812 Spondylosis without myelopathy or radiculopathy, cervical region: Secondary | ICD-10-CM | POA: Diagnosis not present

## 2018-05-26 DIAGNOSIS — N183 Chronic kidney disease, stage 3 (moderate): Secondary | ICD-10-CM | POA: Diagnosis not present

## 2018-05-26 DIAGNOSIS — M1990 Unspecified osteoarthritis, unspecified site: Secondary | ICD-10-CM | POA: Diagnosis not present

## 2018-05-26 DIAGNOSIS — I69354 Hemiplegia and hemiparesis following cerebral infarction affecting left non-dominant side: Secondary | ICD-10-CM | POA: Diagnosis not present

## 2018-05-26 DIAGNOSIS — M5136 Other intervertebral disc degeneration, lumbar region: Secondary | ICD-10-CM | POA: Diagnosis not present

## 2018-05-26 DIAGNOSIS — I129 Hypertensive chronic kidney disease with stage 1 through stage 4 chronic kidney disease, or unspecified chronic kidney disease: Secondary | ICD-10-CM | POA: Diagnosis not present

## 2018-05-26 DIAGNOSIS — D631 Anemia in chronic kidney disease: Secondary | ICD-10-CM | POA: Diagnosis not present

## 2018-05-26 DIAGNOSIS — F0280 Dementia in other diseases classified elsewhere without behavioral disturbance: Secondary | ICD-10-CM | POA: Diagnosis not present

## 2018-05-29 DIAGNOSIS — D631 Anemia in chronic kidney disease: Secondary | ICD-10-CM | POA: Diagnosis not present

## 2018-05-29 DIAGNOSIS — I129 Hypertensive chronic kidney disease with stage 1 through stage 4 chronic kidney disease, or unspecified chronic kidney disease: Secondary | ICD-10-CM | POA: Diagnosis not present

## 2018-05-29 DIAGNOSIS — M1990 Unspecified osteoarthritis, unspecified site: Secondary | ICD-10-CM | POA: Diagnosis not present

## 2018-05-29 DIAGNOSIS — G309 Alzheimer's disease, unspecified: Secondary | ICD-10-CM | POA: Diagnosis not present

## 2018-05-29 DIAGNOSIS — M47812 Spondylosis without myelopathy or radiculopathy, cervical region: Secondary | ICD-10-CM | POA: Diagnosis not present

## 2018-05-29 DIAGNOSIS — N183 Chronic kidney disease, stage 3 (moderate): Secondary | ICD-10-CM | POA: Diagnosis not present

## 2018-05-29 DIAGNOSIS — F0280 Dementia in other diseases classified elsewhere without behavioral disturbance: Secondary | ICD-10-CM | POA: Diagnosis not present

## 2018-05-29 DIAGNOSIS — M5136 Other intervertebral disc degeneration, lumbar region: Secondary | ICD-10-CM | POA: Diagnosis not present

## 2018-05-29 DIAGNOSIS — I69354 Hemiplegia and hemiparesis following cerebral infarction affecting left non-dominant side: Secondary | ICD-10-CM | POA: Diagnosis not present

## 2018-06-02 DIAGNOSIS — F0280 Dementia in other diseases classified elsewhere without behavioral disturbance: Secondary | ICD-10-CM | POA: Diagnosis not present

## 2018-06-02 DIAGNOSIS — D631 Anemia in chronic kidney disease: Secondary | ICD-10-CM | POA: Diagnosis not present

## 2018-06-02 DIAGNOSIS — G309 Alzheimer's disease, unspecified: Secondary | ICD-10-CM | POA: Diagnosis not present

## 2018-06-02 DIAGNOSIS — M47812 Spondylosis without myelopathy or radiculopathy, cervical region: Secondary | ICD-10-CM | POA: Diagnosis not present

## 2018-06-02 DIAGNOSIS — M1990 Unspecified osteoarthritis, unspecified site: Secondary | ICD-10-CM | POA: Diagnosis not present

## 2018-06-02 DIAGNOSIS — I129 Hypertensive chronic kidney disease with stage 1 through stage 4 chronic kidney disease, or unspecified chronic kidney disease: Secondary | ICD-10-CM | POA: Diagnosis not present

## 2018-06-02 DIAGNOSIS — N183 Chronic kidney disease, stage 3 (moderate): Secondary | ICD-10-CM | POA: Diagnosis not present

## 2018-06-02 DIAGNOSIS — I69354 Hemiplegia and hemiparesis following cerebral infarction affecting left non-dominant side: Secondary | ICD-10-CM | POA: Diagnosis not present

## 2018-06-02 DIAGNOSIS — M5136 Other intervertebral disc degeneration, lumbar region: Secondary | ICD-10-CM | POA: Diagnosis not present

## 2018-06-03 DIAGNOSIS — I69354 Hemiplegia and hemiparesis following cerebral infarction affecting left non-dominant side: Secondary | ICD-10-CM | POA: Diagnosis not present

## 2018-06-03 DIAGNOSIS — I129 Hypertensive chronic kidney disease with stage 1 through stage 4 chronic kidney disease, or unspecified chronic kidney disease: Secondary | ICD-10-CM | POA: Diagnosis not present

## 2018-06-03 DIAGNOSIS — M1990 Unspecified osteoarthritis, unspecified site: Secondary | ICD-10-CM | POA: Diagnosis not present

## 2018-06-03 DIAGNOSIS — F0280 Dementia in other diseases classified elsewhere without behavioral disturbance: Secondary | ICD-10-CM | POA: Diagnosis not present

## 2018-06-03 DIAGNOSIS — M47812 Spondylosis without myelopathy or radiculopathy, cervical region: Secondary | ICD-10-CM | POA: Diagnosis not present

## 2018-06-03 DIAGNOSIS — G309 Alzheimer's disease, unspecified: Secondary | ICD-10-CM | POA: Diagnosis not present

## 2018-06-03 DIAGNOSIS — D631 Anemia in chronic kidney disease: Secondary | ICD-10-CM | POA: Diagnosis not present

## 2018-06-03 DIAGNOSIS — M5136 Other intervertebral disc degeneration, lumbar region: Secondary | ICD-10-CM | POA: Diagnosis not present

## 2018-06-03 DIAGNOSIS — N183 Chronic kidney disease, stage 3 (moderate): Secondary | ICD-10-CM | POA: Diagnosis not present

## 2018-06-10 DIAGNOSIS — N183 Chronic kidney disease, stage 3 (moderate): Secondary | ICD-10-CM | POA: Diagnosis not present

## 2018-06-10 DIAGNOSIS — M5136 Other intervertebral disc degeneration, lumbar region: Secondary | ICD-10-CM | POA: Diagnosis not present

## 2018-06-10 DIAGNOSIS — F331 Major depressive disorder, recurrent, moderate: Secondary | ICD-10-CM | POA: Diagnosis not present

## 2018-06-10 DIAGNOSIS — E034 Atrophy of thyroid (acquired): Secondary | ICD-10-CM | POA: Diagnosis not present

## 2018-06-10 DIAGNOSIS — I10 Essential (primary) hypertension: Secondary | ICD-10-CM | POA: Diagnosis not present

## 2018-06-10 DIAGNOSIS — F0280 Dementia in other diseases classified elsewhere without behavioral disturbance: Secondary | ICD-10-CM | POA: Diagnosis not present

## 2018-06-10 DIAGNOSIS — D631 Anemia in chronic kidney disease: Secondary | ICD-10-CM | POA: Diagnosis not present

## 2018-06-10 DIAGNOSIS — M1990 Unspecified osteoarthritis, unspecified site: Secondary | ICD-10-CM | POA: Diagnosis not present

## 2018-06-10 DIAGNOSIS — M47812 Spondylosis without myelopathy or radiculopathy, cervical region: Secondary | ICD-10-CM | POA: Diagnosis not present

## 2018-06-10 DIAGNOSIS — G250 Essential tremor: Secondary | ICD-10-CM | POA: Diagnosis not present

## 2018-06-10 DIAGNOSIS — G309 Alzheimer's disease, unspecified: Secondary | ICD-10-CM | POA: Diagnosis not present

## 2018-06-10 DIAGNOSIS — F0390 Unspecified dementia without behavioral disturbance: Secondary | ICD-10-CM | POA: Diagnosis not present

## 2018-06-10 DIAGNOSIS — E7849 Other hyperlipidemia: Secondary | ICD-10-CM | POA: Diagnosis not present

## 2018-06-10 DIAGNOSIS — I69354 Hemiplegia and hemiparesis following cerebral infarction affecting left non-dominant side: Secondary | ICD-10-CM | POA: Diagnosis not present

## 2018-06-10 DIAGNOSIS — I129 Hypertensive chronic kidney disease with stage 1 through stage 4 chronic kidney disease, or unspecified chronic kidney disease: Secondary | ICD-10-CM | POA: Diagnosis not present

## 2018-06-16 DIAGNOSIS — M1990 Unspecified osteoarthritis, unspecified site: Secondary | ICD-10-CM | POA: Diagnosis not present

## 2018-06-16 DIAGNOSIS — N183 Chronic kidney disease, stage 3 (moderate): Secondary | ICD-10-CM | POA: Diagnosis not present

## 2018-06-16 DIAGNOSIS — G309 Alzheimer's disease, unspecified: Secondary | ICD-10-CM | POA: Diagnosis not present

## 2018-06-16 DIAGNOSIS — D631 Anemia in chronic kidney disease: Secondary | ICD-10-CM | POA: Diagnosis not present

## 2018-06-16 DIAGNOSIS — I129 Hypertensive chronic kidney disease with stage 1 through stage 4 chronic kidney disease, or unspecified chronic kidney disease: Secondary | ICD-10-CM | POA: Diagnosis not present

## 2018-06-16 DIAGNOSIS — M5136 Other intervertebral disc degeneration, lumbar region: Secondary | ICD-10-CM | POA: Diagnosis not present

## 2018-06-16 DIAGNOSIS — I69354 Hemiplegia and hemiparesis following cerebral infarction affecting left non-dominant side: Secondary | ICD-10-CM | POA: Diagnosis not present

## 2018-06-16 DIAGNOSIS — M47812 Spondylosis without myelopathy or radiculopathy, cervical region: Secondary | ICD-10-CM | POA: Diagnosis not present

## 2018-06-16 DIAGNOSIS — F0280 Dementia in other diseases classified elsewhere without behavioral disturbance: Secondary | ICD-10-CM | POA: Diagnosis not present

## 2018-06-17 DIAGNOSIS — G309 Alzheimer's disease, unspecified: Secondary | ICD-10-CM | POA: Diagnosis not present

## 2018-06-17 DIAGNOSIS — I129 Hypertensive chronic kidney disease with stage 1 through stage 4 chronic kidney disease, or unspecified chronic kidney disease: Secondary | ICD-10-CM | POA: Diagnosis not present

## 2018-06-17 DIAGNOSIS — M47812 Spondylosis without myelopathy or radiculopathy, cervical region: Secondary | ICD-10-CM | POA: Diagnosis not present

## 2018-06-17 DIAGNOSIS — N183 Chronic kidney disease, stage 3 (moderate): Secondary | ICD-10-CM | POA: Diagnosis not present

## 2018-06-17 DIAGNOSIS — M1990 Unspecified osteoarthritis, unspecified site: Secondary | ICD-10-CM | POA: Diagnosis not present

## 2018-06-17 DIAGNOSIS — D631 Anemia in chronic kidney disease: Secondary | ICD-10-CM | POA: Diagnosis not present

## 2018-06-17 DIAGNOSIS — M5136 Other intervertebral disc degeneration, lumbar region: Secondary | ICD-10-CM | POA: Diagnosis not present

## 2018-06-17 DIAGNOSIS — I69354 Hemiplegia and hemiparesis following cerebral infarction affecting left non-dominant side: Secondary | ICD-10-CM | POA: Diagnosis not present

## 2018-06-22 DIAGNOSIS — M5136 Other intervertebral disc degeneration, lumbar region: Secondary | ICD-10-CM | POA: Diagnosis not present

## 2018-06-22 DIAGNOSIS — D631 Anemia in chronic kidney disease: Secondary | ICD-10-CM | POA: Diagnosis not present

## 2018-06-22 DIAGNOSIS — F0280 Dementia in other diseases classified elsewhere without behavioral disturbance: Secondary | ICD-10-CM | POA: Diagnosis not present

## 2018-06-22 DIAGNOSIS — I129 Hypertensive chronic kidney disease with stage 1 through stage 4 chronic kidney disease, or unspecified chronic kidney disease: Secondary | ICD-10-CM | POA: Diagnosis not present

## 2018-06-22 DIAGNOSIS — N183 Chronic kidney disease, stage 3 (moderate): Secondary | ICD-10-CM | POA: Diagnosis not present

## 2018-06-22 DIAGNOSIS — I69354 Hemiplegia and hemiparesis following cerebral infarction affecting left non-dominant side: Secondary | ICD-10-CM | POA: Diagnosis not present

## 2018-06-22 DIAGNOSIS — M47812 Spondylosis without myelopathy or radiculopathy, cervical region: Secondary | ICD-10-CM | POA: Diagnosis not present

## 2018-06-22 DIAGNOSIS — G309 Alzheimer's disease, unspecified: Secondary | ICD-10-CM | POA: Diagnosis not present

## 2018-06-22 DIAGNOSIS — M1990 Unspecified osteoarthritis, unspecified site: Secondary | ICD-10-CM | POA: Diagnosis not present

## 2018-06-29 DIAGNOSIS — M47812 Spondylosis without myelopathy or radiculopathy, cervical region: Secondary | ICD-10-CM | POA: Diagnosis not present

## 2018-06-29 DIAGNOSIS — F0280 Dementia in other diseases classified elsewhere without behavioral disturbance: Secondary | ICD-10-CM | POA: Diagnosis not present

## 2018-06-29 DIAGNOSIS — M5136 Other intervertebral disc degeneration, lumbar region: Secondary | ICD-10-CM | POA: Diagnosis not present

## 2018-06-29 DIAGNOSIS — D631 Anemia in chronic kidney disease: Secondary | ICD-10-CM | POA: Diagnosis not present

## 2018-06-29 DIAGNOSIS — I69354 Hemiplegia and hemiparesis following cerebral infarction affecting left non-dominant side: Secondary | ICD-10-CM | POA: Diagnosis not present

## 2018-06-29 DIAGNOSIS — G309 Alzheimer's disease, unspecified: Secondary | ICD-10-CM | POA: Diagnosis not present

## 2018-06-29 DIAGNOSIS — I129 Hypertensive chronic kidney disease with stage 1 through stage 4 chronic kidney disease, or unspecified chronic kidney disease: Secondary | ICD-10-CM | POA: Diagnosis not present

## 2018-06-29 DIAGNOSIS — N183 Chronic kidney disease, stage 3 (moderate): Secondary | ICD-10-CM | POA: Diagnosis not present

## 2018-06-29 DIAGNOSIS — M1990 Unspecified osteoarthritis, unspecified site: Secondary | ICD-10-CM | POA: Diagnosis not present

## 2018-06-30 DIAGNOSIS — F0280 Dementia in other diseases classified elsewhere without behavioral disturbance: Secondary | ICD-10-CM | POA: Diagnosis not present

## 2018-06-30 DIAGNOSIS — N183 Chronic kidney disease, stage 3 (moderate): Secondary | ICD-10-CM | POA: Diagnosis not present

## 2018-06-30 DIAGNOSIS — G309 Alzheimer's disease, unspecified: Secondary | ICD-10-CM | POA: Diagnosis not present

## 2018-06-30 DIAGNOSIS — M1990 Unspecified osteoarthritis, unspecified site: Secondary | ICD-10-CM | POA: Diagnosis not present

## 2018-06-30 DIAGNOSIS — M47812 Spondylosis without myelopathy or radiculopathy, cervical region: Secondary | ICD-10-CM | POA: Diagnosis not present

## 2018-06-30 DIAGNOSIS — I129 Hypertensive chronic kidney disease with stage 1 through stage 4 chronic kidney disease, or unspecified chronic kidney disease: Secondary | ICD-10-CM | POA: Diagnosis not present

## 2018-06-30 DIAGNOSIS — I69354 Hemiplegia and hemiparesis following cerebral infarction affecting left non-dominant side: Secondary | ICD-10-CM | POA: Diagnosis not present

## 2018-06-30 DIAGNOSIS — M5136 Other intervertebral disc degeneration, lumbar region: Secondary | ICD-10-CM | POA: Diagnosis not present

## 2018-06-30 DIAGNOSIS — D631 Anemia in chronic kidney disease: Secondary | ICD-10-CM | POA: Diagnosis not present

## 2018-07-03 DIAGNOSIS — M47812 Spondylosis without myelopathy or radiculopathy, cervical region: Secondary | ICD-10-CM | POA: Diagnosis not present

## 2018-07-03 DIAGNOSIS — D631 Anemia in chronic kidney disease: Secondary | ICD-10-CM | POA: Diagnosis not present

## 2018-07-03 DIAGNOSIS — N183 Chronic kidney disease, stage 3 (moderate): Secondary | ICD-10-CM | POA: Diagnosis not present

## 2018-07-03 DIAGNOSIS — G309 Alzheimer's disease, unspecified: Secondary | ICD-10-CM | POA: Diagnosis not present

## 2018-07-03 DIAGNOSIS — M5136 Other intervertebral disc degeneration, lumbar region: Secondary | ICD-10-CM | POA: Diagnosis not present

## 2018-07-03 DIAGNOSIS — I129 Hypertensive chronic kidney disease with stage 1 through stage 4 chronic kidney disease, or unspecified chronic kidney disease: Secondary | ICD-10-CM | POA: Diagnosis not present

## 2018-07-03 DIAGNOSIS — M1990 Unspecified osteoarthritis, unspecified site: Secondary | ICD-10-CM | POA: Diagnosis not present

## 2018-07-03 DIAGNOSIS — F0280 Dementia in other diseases classified elsewhere without behavioral disturbance: Secondary | ICD-10-CM | POA: Diagnosis not present

## 2018-07-03 DIAGNOSIS — I69354 Hemiplegia and hemiparesis following cerebral infarction affecting left non-dominant side: Secondary | ICD-10-CM | POA: Diagnosis not present

## 2018-07-06 DIAGNOSIS — M47812 Spondylosis without myelopathy or radiculopathy, cervical region: Secondary | ICD-10-CM | POA: Diagnosis not present

## 2018-07-06 DIAGNOSIS — I129 Hypertensive chronic kidney disease with stage 1 through stage 4 chronic kidney disease, or unspecified chronic kidney disease: Secondary | ICD-10-CM | POA: Diagnosis not present

## 2018-07-06 DIAGNOSIS — D631 Anemia in chronic kidney disease: Secondary | ICD-10-CM | POA: Diagnosis not present

## 2018-07-06 DIAGNOSIS — M5136 Other intervertebral disc degeneration, lumbar region: Secondary | ICD-10-CM | POA: Diagnosis not present

## 2018-07-06 DIAGNOSIS — N183 Chronic kidney disease, stage 3 (moderate): Secondary | ICD-10-CM | POA: Diagnosis not present

## 2018-07-06 DIAGNOSIS — F0280 Dementia in other diseases classified elsewhere without behavioral disturbance: Secondary | ICD-10-CM | POA: Diagnosis not present

## 2018-07-06 DIAGNOSIS — I69354 Hemiplegia and hemiparesis following cerebral infarction affecting left non-dominant side: Secondary | ICD-10-CM | POA: Diagnosis not present

## 2018-07-06 DIAGNOSIS — M1990 Unspecified osteoarthritis, unspecified site: Secondary | ICD-10-CM | POA: Diagnosis not present

## 2018-07-06 DIAGNOSIS — G309 Alzheimer's disease, unspecified: Secondary | ICD-10-CM | POA: Diagnosis not present

## 2018-07-07 DIAGNOSIS — I129 Hypertensive chronic kidney disease with stage 1 through stage 4 chronic kidney disease, or unspecified chronic kidney disease: Secondary | ICD-10-CM | POA: Diagnosis not present

## 2018-07-07 DIAGNOSIS — D631 Anemia in chronic kidney disease: Secondary | ICD-10-CM | POA: Diagnosis not present

## 2018-07-07 DIAGNOSIS — M47812 Spondylosis without myelopathy or radiculopathy, cervical region: Secondary | ICD-10-CM | POA: Diagnosis not present

## 2018-07-07 DIAGNOSIS — M1990 Unspecified osteoarthritis, unspecified site: Secondary | ICD-10-CM | POA: Diagnosis not present

## 2018-07-07 DIAGNOSIS — G309 Alzheimer's disease, unspecified: Secondary | ICD-10-CM | POA: Diagnosis not present

## 2018-07-07 DIAGNOSIS — F0280 Dementia in other diseases classified elsewhere without behavioral disturbance: Secondary | ICD-10-CM | POA: Diagnosis not present

## 2018-07-07 DIAGNOSIS — I69354 Hemiplegia and hemiparesis following cerebral infarction affecting left non-dominant side: Secondary | ICD-10-CM | POA: Diagnosis not present

## 2018-07-07 DIAGNOSIS — M5136 Other intervertebral disc degeneration, lumbar region: Secondary | ICD-10-CM | POA: Diagnosis not present

## 2018-07-07 DIAGNOSIS — N183 Chronic kidney disease, stage 3 (moderate): Secondary | ICD-10-CM | POA: Diagnosis not present

## 2018-07-08 DIAGNOSIS — I129 Hypertensive chronic kidney disease with stage 1 through stage 4 chronic kidney disease, or unspecified chronic kidney disease: Secondary | ICD-10-CM | POA: Diagnosis not present

## 2018-07-08 DIAGNOSIS — M5136 Other intervertebral disc degeneration, lumbar region: Secondary | ICD-10-CM | POA: Diagnosis not present

## 2018-07-08 DIAGNOSIS — M47812 Spondylosis without myelopathy or radiculopathy, cervical region: Secondary | ICD-10-CM | POA: Diagnosis not present

## 2018-07-08 DIAGNOSIS — M1990 Unspecified osteoarthritis, unspecified site: Secondary | ICD-10-CM | POA: Diagnosis not present

## 2018-07-08 DIAGNOSIS — N183 Chronic kidney disease, stage 3 (moderate): Secondary | ICD-10-CM | POA: Diagnosis not present

## 2018-07-08 DIAGNOSIS — I69354 Hemiplegia and hemiparesis following cerebral infarction affecting left non-dominant side: Secondary | ICD-10-CM | POA: Diagnosis not present

## 2018-07-08 DIAGNOSIS — G309 Alzheimer's disease, unspecified: Secondary | ICD-10-CM | POA: Diagnosis not present

## 2018-07-08 DIAGNOSIS — D631 Anemia in chronic kidney disease: Secondary | ICD-10-CM | POA: Diagnosis not present

## 2018-07-08 DIAGNOSIS — F0280 Dementia in other diseases classified elsewhere without behavioral disturbance: Secondary | ICD-10-CM | POA: Diagnosis not present

## 2018-07-10 IMAGING — CR DG RIBS W/ CHEST 3+V*L*
4 series · 4 of 4 positions shown · non-contrast
Comparison: 06/14/2016

CLINICAL DATA: Multiple falls over the last few days. Left-sided
back pain and bruising.

EXAM:
LEFT RIBS AND CHEST - 3+ VIEW

[chest pa]
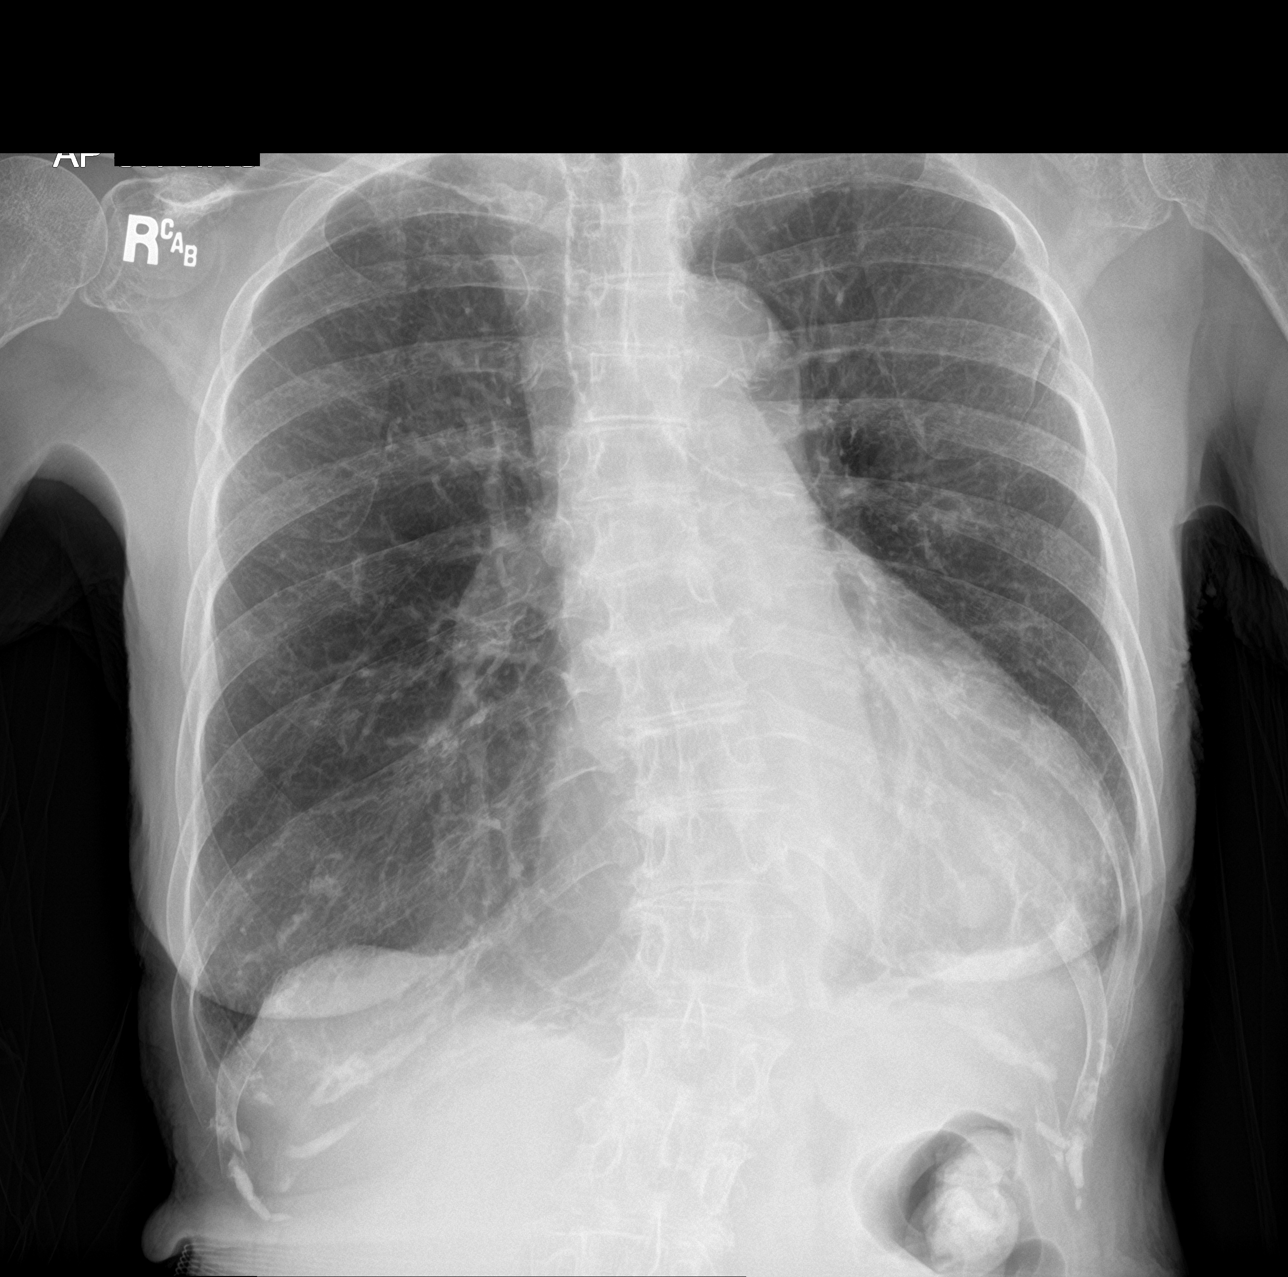

[rib ap (1 of 2)]
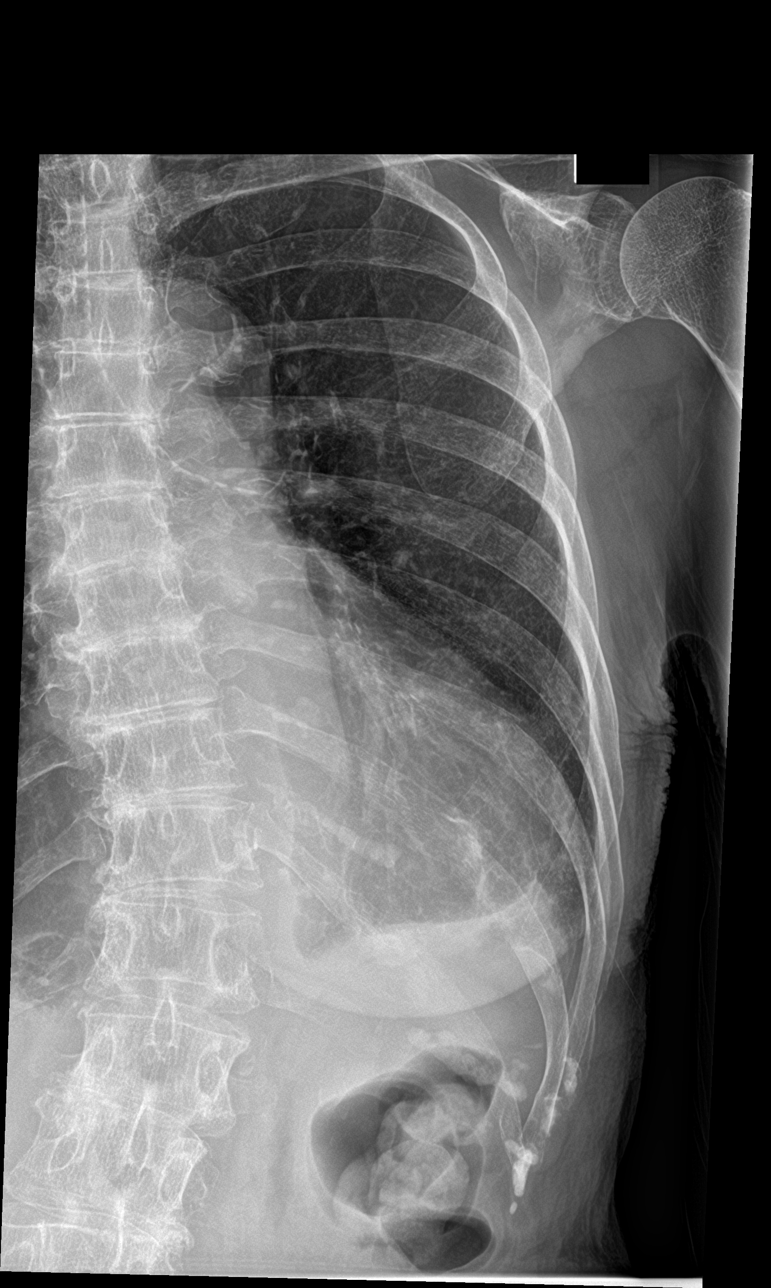

[rib ap obl]
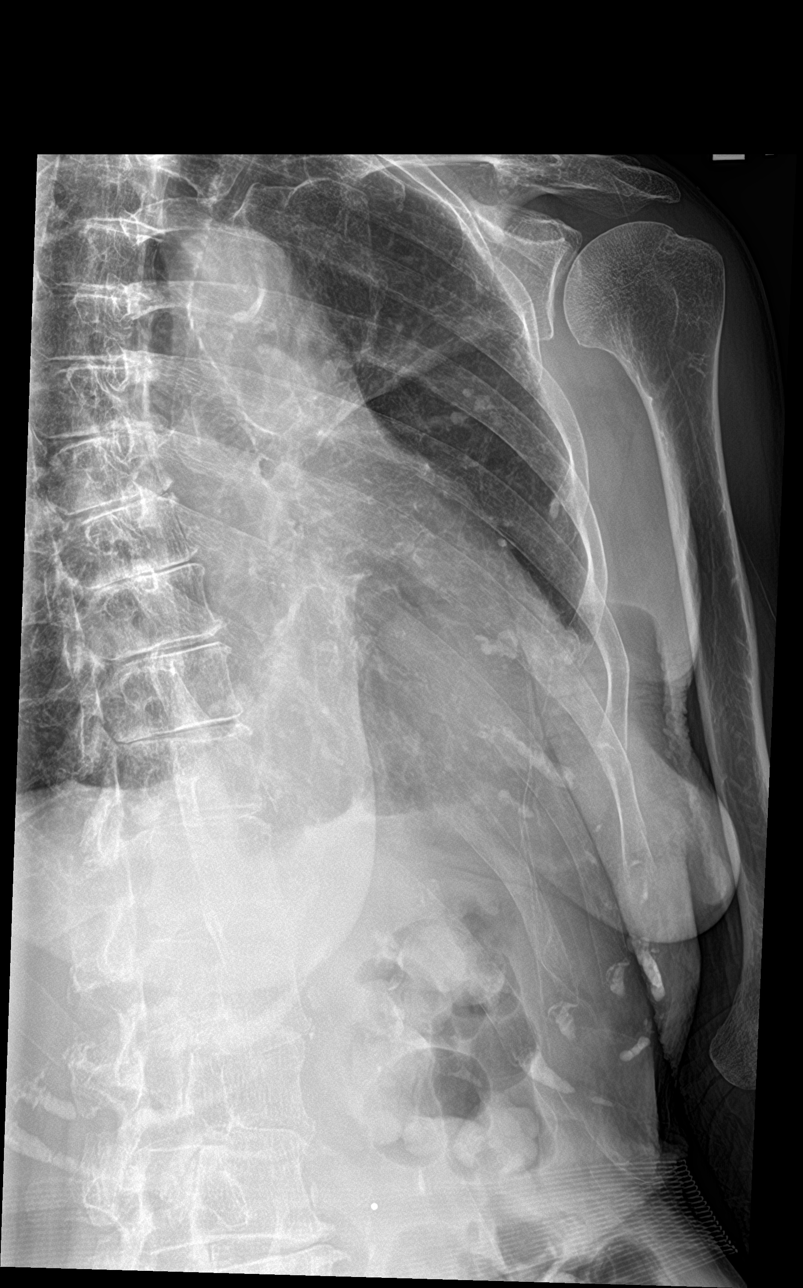

[rib ap (2 of 2)]
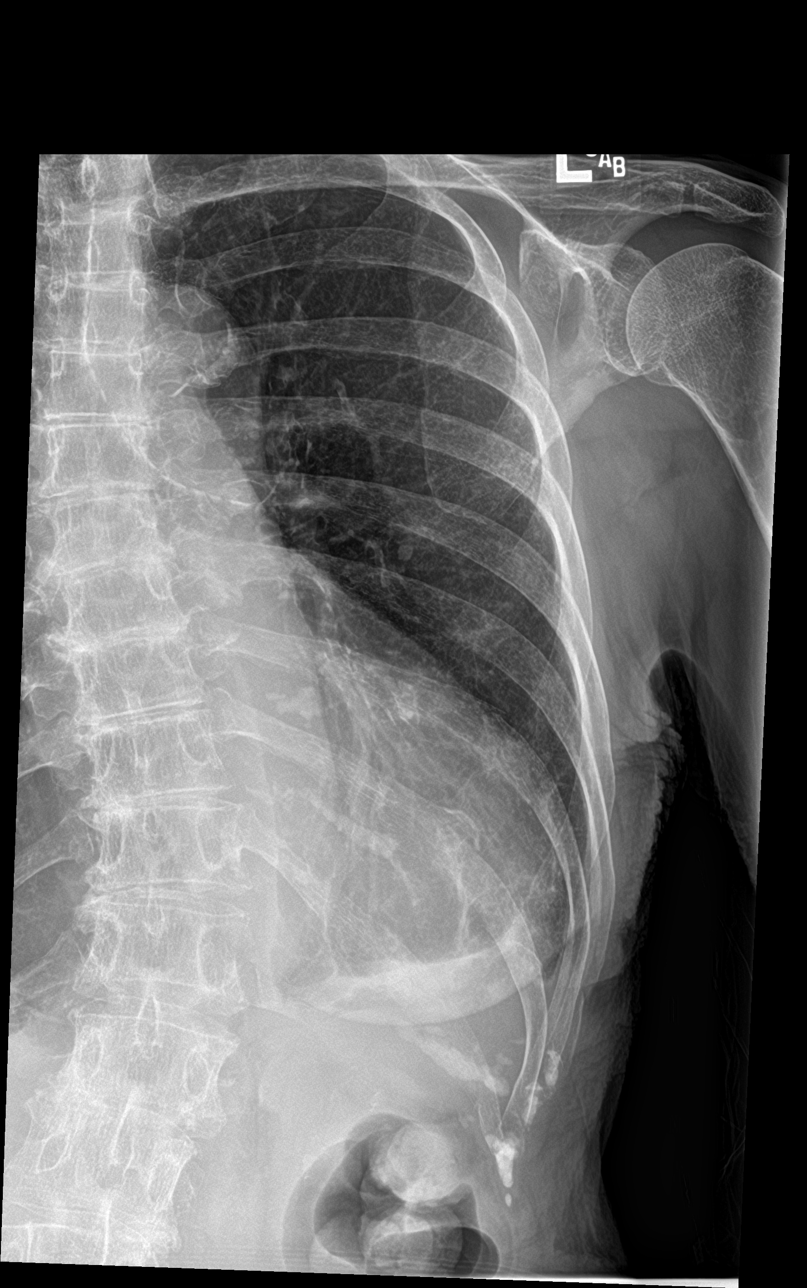

[4 of 4 positions shown; findings below may reference images not displayed]

FINDINGS: Chronic enlargement of cardiac silhouette. Chronic aortic
atherosclerosis. There is a small left effusion with mild left base
atelectasis. Bilateral nipple shadows. Chronic spinal curvature
convex to the left. Left rib details show nondisplaced fractures of
the left seventh and eighth ribs antro laterally. Minimally
displaced fractures of the left ninth and tenth ribs posteriorly.
IMPRESSION: Nondisplaced fractures of the left seventh and eighth ribs antero
laterally.

Minimally displaced fractures of the left ninth and tenth ribs
posteriorly.

Mild atelectasis at the left base and small effusion probably
secondary to that.

## 2018-07-14 DIAGNOSIS — D631 Anemia in chronic kidney disease: Secondary | ICD-10-CM | POA: Diagnosis not present

## 2018-07-14 DIAGNOSIS — I129 Hypertensive chronic kidney disease with stage 1 through stage 4 chronic kidney disease, or unspecified chronic kidney disease: Secondary | ICD-10-CM | POA: Diagnosis not present

## 2018-07-14 DIAGNOSIS — M5136 Other intervertebral disc degeneration, lumbar region: Secondary | ICD-10-CM | POA: Diagnosis not present

## 2018-07-14 DIAGNOSIS — M1990 Unspecified osteoarthritis, unspecified site: Secondary | ICD-10-CM | POA: Diagnosis not present

## 2018-07-14 DIAGNOSIS — G309 Alzheimer's disease, unspecified: Secondary | ICD-10-CM | POA: Diagnosis not present

## 2018-07-14 DIAGNOSIS — G250 Essential tremor: Secondary | ICD-10-CM | POA: Diagnosis not present

## 2018-07-14 DIAGNOSIS — F0280 Dementia in other diseases classified elsewhere without behavioral disturbance: Secondary | ICD-10-CM | POA: Diagnosis not present

## 2018-07-14 DIAGNOSIS — M47812 Spondylosis without myelopathy or radiculopathy, cervical region: Secondary | ICD-10-CM | POA: Diagnosis not present

## 2018-07-14 DIAGNOSIS — I69354 Hemiplegia and hemiparesis following cerebral infarction affecting left non-dominant side: Secondary | ICD-10-CM | POA: Diagnosis not present

## 2018-07-14 DIAGNOSIS — N183 Chronic kidney disease, stage 3 (moderate): Secondary | ICD-10-CM | POA: Diagnosis not present

## 2018-07-15 DIAGNOSIS — M47812 Spondylosis without myelopathy or radiculopathy, cervical region: Secondary | ICD-10-CM | POA: Diagnosis not present

## 2018-07-20 DIAGNOSIS — Z139 Encounter for screening, unspecified: Secondary | ICD-10-CM | POA: Diagnosis not present

## 2018-07-21 DIAGNOSIS — F0280 Dementia in other diseases classified elsewhere without behavioral disturbance: Secondary | ICD-10-CM | POA: Diagnosis not present

## 2018-07-21 DIAGNOSIS — M5136 Other intervertebral disc degeneration, lumbar region: Secondary | ICD-10-CM | POA: Diagnosis not present

## 2018-07-21 DIAGNOSIS — M1990 Unspecified osteoarthritis, unspecified site: Secondary | ICD-10-CM | POA: Diagnosis not present

## 2018-07-21 DIAGNOSIS — M47812 Spondylosis without myelopathy or radiculopathy, cervical region: Secondary | ICD-10-CM | POA: Diagnosis not present

## 2018-07-21 DIAGNOSIS — D631 Anemia in chronic kidney disease: Secondary | ICD-10-CM | POA: Diagnosis not present

## 2018-07-21 DIAGNOSIS — N183 Chronic kidney disease, stage 3 (moderate): Secondary | ICD-10-CM | POA: Diagnosis not present

## 2018-07-21 DIAGNOSIS — G309 Alzheimer's disease, unspecified: Secondary | ICD-10-CM | POA: Diagnosis not present

## 2018-07-21 DIAGNOSIS — I129 Hypertensive chronic kidney disease with stage 1 through stage 4 chronic kidney disease, or unspecified chronic kidney disease: Secondary | ICD-10-CM | POA: Diagnosis not present

## 2018-07-21 DIAGNOSIS — I69354 Hemiplegia and hemiparesis following cerebral infarction affecting left non-dominant side: Secondary | ICD-10-CM | POA: Diagnosis not present

## 2018-07-28 DIAGNOSIS — I69354 Hemiplegia and hemiparesis following cerebral infarction affecting left non-dominant side: Secondary | ICD-10-CM | POA: Diagnosis not present

## 2018-07-28 DIAGNOSIS — M1990 Unspecified osteoarthritis, unspecified site: Secondary | ICD-10-CM | POA: Diagnosis not present

## 2018-07-28 DIAGNOSIS — N183 Chronic kidney disease, stage 3 (moderate): Secondary | ICD-10-CM | POA: Diagnosis not present

## 2018-07-28 DIAGNOSIS — M47812 Spondylosis without myelopathy or radiculopathy, cervical region: Secondary | ICD-10-CM | POA: Diagnosis not present

## 2018-07-28 DIAGNOSIS — G309 Alzheimer's disease, unspecified: Secondary | ICD-10-CM | POA: Diagnosis not present

## 2018-07-28 DIAGNOSIS — I129 Hypertensive chronic kidney disease with stage 1 through stage 4 chronic kidney disease, or unspecified chronic kidney disease: Secondary | ICD-10-CM | POA: Diagnosis not present

## 2018-07-28 DIAGNOSIS — D631 Anemia in chronic kidney disease: Secondary | ICD-10-CM | POA: Diagnosis not present

## 2018-07-28 DIAGNOSIS — F0280 Dementia in other diseases classified elsewhere without behavioral disturbance: Secondary | ICD-10-CM | POA: Diagnosis not present

## 2018-07-28 DIAGNOSIS — M5136 Other intervertebral disc degeneration, lumbar region: Secondary | ICD-10-CM | POA: Diagnosis not present

## 2018-08-06 DIAGNOSIS — N183 Chronic kidney disease, stage 3 (moderate): Secondary | ICD-10-CM | POA: Diagnosis not present

## 2018-08-06 DIAGNOSIS — F0280 Dementia in other diseases classified elsewhere without behavioral disturbance: Secondary | ICD-10-CM | POA: Diagnosis not present

## 2018-08-06 DIAGNOSIS — M5136 Other intervertebral disc degeneration, lumbar region: Secondary | ICD-10-CM | POA: Diagnosis not present

## 2018-08-06 DIAGNOSIS — G309 Alzheimer's disease, unspecified: Secondary | ICD-10-CM | POA: Diagnosis not present

## 2018-08-06 DIAGNOSIS — M47812 Spondylosis without myelopathy or radiculopathy, cervical region: Secondary | ICD-10-CM | POA: Diagnosis not present

## 2018-08-06 DIAGNOSIS — I69354 Hemiplegia and hemiparesis following cerebral infarction affecting left non-dominant side: Secondary | ICD-10-CM | POA: Diagnosis not present

## 2018-08-06 DIAGNOSIS — M1990 Unspecified osteoarthritis, unspecified site: Secondary | ICD-10-CM | POA: Diagnosis not present

## 2018-08-06 DIAGNOSIS — D631 Anemia in chronic kidney disease: Secondary | ICD-10-CM | POA: Diagnosis not present

## 2018-08-06 DIAGNOSIS — I129 Hypertensive chronic kidney disease with stage 1 through stage 4 chronic kidney disease, or unspecified chronic kidney disease: Secondary | ICD-10-CM | POA: Diagnosis not present

## 2018-08-07 DIAGNOSIS — R569 Unspecified convulsions: Secondary | ICD-10-CM | POA: Diagnosis not present

## 2018-08-12 DIAGNOSIS — N183 Chronic kidney disease, stage 3 (moderate): Secondary | ICD-10-CM | POA: Diagnosis not present

## 2018-08-12 DIAGNOSIS — I69354 Hemiplegia and hemiparesis following cerebral infarction affecting left non-dominant side: Secondary | ICD-10-CM | POA: Diagnosis not present

## 2018-08-12 DIAGNOSIS — M1990 Unspecified osteoarthritis, unspecified site: Secondary | ICD-10-CM | POA: Diagnosis not present

## 2018-08-12 DIAGNOSIS — I129 Hypertensive chronic kidney disease with stage 1 through stage 4 chronic kidney disease, or unspecified chronic kidney disease: Secondary | ICD-10-CM | POA: Diagnosis not present

## 2018-08-12 DIAGNOSIS — M5136 Other intervertebral disc degeneration, lumbar region: Secondary | ICD-10-CM | POA: Diagnosis not present

## 2018-08-12 DIAGNOSIS — M47812 Spondylosis without myelopathy or radiculopathy, cervical region: Secondary | ICD-10-CM | POA: Diagnosis not present

## 2018-08-12 DIAGNOSIS — F0280 Dementia in other diseases classified elsewhere without behavioral disturbance: Secondary | ICD-10-CM | POA: Diagnosis not present

## 2018-08-12 DIAGNOSIS — G309 Alzheimer's disease, unspecified: Secondary | ICD-10-CM | POA: Diagnosis not present

## 2018-08-12 DIAGNOSIS — D631 Anemia in chronic kidney disease: Secondary | ICD-10-CM | POA: Diagnosis not present

## 2018-08-18 DIAGNOSIS — I69354 Hemiplegia and hemiparesis following cerebral infarction affecting left non-dominant side: Secondary | ICD-10-CM | POA: Diagnosis not present

## 2018-08-18 DIAGNOSIS — F0280 Dementia in other diseases classified elsewhere without behavioral disturbance: Secondary | ICD-10-CM | POA: Diagnosis not present

## 2018-08-18 DIAGNOSIS — N183 Chronic kidney disease, stage 3 (moderate): Secondary | ICD-10-CM | POA: Diagnosis not present

## 2018-08-18 DIAGNOSIS — M1990 Unspecified osteoarthritis, unspecified site: Secondary | ICD-10-CM | POA: Diagnosis not present

## 2018-08-18 DIAGNOSIS — G309 Alzheimer's disease, unspecified: Secondary | ICD-10-CM | POA: Diagnosis not present

## 2018-08-18 DIAGNOSIS — D631 Anemia in chronic kidney disease: Secondary | ICD-10-CM | POA: Diagnosis not present

## 2018-08-18 DIAGNOSIS — M5136 Other intervertebral disc degeneration, lumbar region: Secondary | ICD-10-CM | POA: Diagnosis not present

## 2018-08-18 DIAGNOSIS — I129 Hypertensive chronic kidney disease with stage 1 through stage 4 chronic kidney disease, or unspecified chronic kidney disease: Secondary | ICD-10-CM | POA: Diagnosis not present

## 2018-08-18 DIAGNOSIS — M47812 Spondylosis without myelopathy or radiculopathy, cervical region: Secondary | ICD-10-CM | POA: Diagnosis not present

## 2018-08-21 DIAGNOSIS — M5136 Other intervertebral disc degeneration, lumbar region: Secondary | ICD-10-CM | POA: Diagnosis not present

## 2018-08-21 DIAGNOSIS — E034 Atrophy of thyroid (acquired): Secondary | ICD-10-CM | POA: Diagnosis not present

## 2018-08-21 DIAGNOSIS — I10 Essential (primary) hypertension: Secondary | ICD-10-CM | POA: Diagnosis not present

## 2018-08-21 DIAGNOSIS — N183 Chronic kidney disease, stage 3 (moderate): Secondary | ICD-10-CM | POA: Diagnosis not present

## 2018-08-21 DIAGNOSIS — E7849 Other hyperlipidemia: Secondary | ICD-10-CM | POA: Diagnosis not present

## 2018-08-24 DIAGNOSIS — M47812 Spondylosis without myelopathy or radiculopathy, cervical region: Secondary | ICD-10-CM | POA: Diagnosis not present

## 2018-08-25 DIAGNOSIS — N183 Chronic kidney disease, stage 3 (moderate): Secondary | ICD-10-CM | POA: Diagnosis not present

## 2018-08-25 DIAGNOSIS — M5136 Other intervertebral disc degeneration, lumbar region: Secondary | ICD-10-CM | POA: Diagnosis not present

## 2018-08-25 DIAGNOSIS — D631 Anemia in chronic kidney disease: Secondary | ICD-10-CM | POA: Diagnosis not present

## 2018-08-25 DIAGNOSIS — G309 Alzheimer's disease, unspecified: Secondary | ICD-10-CM | POA: Diagnosis not present

## 2018-08-25 DIAGNOSIS — M47812 Spondylosis without myelopathy or radiculopathy, cervical region: Secondary | ICD-10-CM | POA: Diagnosis not present

## 2018-08-25 DIAGNOSIS — M1990 Unspecified osteoarthritis, unspecified site: Secondary | ICD-10-CM | POA: Diagnosis not present

## 2018-08-25 DIAGNOSIS — I69354 Hemiplegia and hemiparesis following cerebral infarction affecting left non-dominant side: Secondary | ICD-10-CM | POA: Diagnosis not present

## 2018-08-25 DIAGNOSIS — F0280 Dementia in other diseases classified elsewhere without behavioral disturbance: Secondary | ICD-10-CM | POA: Diagnosis not present

## 2018-08-25 DIAGNOSIS — I129 Hypertensive chronic kidney disease with stage 1 through stage 4 chronic kidney disease, or unspecified chronic kidney disease: Secondary | ICD-10-CM | POA: Diagnosis not present

## 2018-08-28 DIAGNOSIS — F0390 Unspecified dementia without behavioral disturbance: Secondary | ICD-10-CM | POA: Diagnosis not present

## 2018-08-28 DIAGNOSIS — G250 Essential tremor: Secondary | ICD-10-CM | POA: Diagnosis not present

## 2018-08-28 DIAGNOSIS — E034 Atrophy of thyroid (acquired): Secondary | ICD-10-CM | POA: Diagnosis not present

## 2018-08-28 DIAGNOSIS — N183 Chronic kidney disease, stage 3 (moderate): Secondary | ICD-10-CM | POA: Diagnosis not present

## 2018-08-28 DIAGNOSIS — F331 Major depressive disorder, recurrent, moderate: Secondary | ICD-10-CM | POA: Diagnosis not present

## 2018-08-28 DIAGNOSIS — I10 Essential (primary) hypertension: Secondary | ICD-10-CM | POA: Diagnosis not present

## 2018-08-28 DIAGNOSIS — R399 Unspecified symptoms and signs involving the genitourinary system: Secondary | ICD-10-CM | POA: Diagnosis not present

## 2018-08-28 DIAGNOSIS — G4733 Obstructive sleep apnea (adult) (pediatric): Secondary | ICD-10-CM | POA: Diagnosis not present

## 2018-08-28 DIAGNOSIS — M5136 Other intervertebral disc degeneration, lumbar region: Secondary | ICD-10-CM | POA: Diagnosis not present

## 2018-08-28 DIAGNOSIS — Z0000 Encounter for general adult medical examination without abnormal findings: Secondary | ICD-10-CM | POA: Diagnosis not present

## 2018-09-16 IMAGING — DX DG FEMUR 2+V*R*
4 series · 4 of 4 positions shown · non-contrast
Comparison: 09/10/2016 right hip radiographs

CLINICAL DATA: 85 y/o  F; postop right hip fracture.

EXAM:
RIGHT FEMUR 2 VIEWS

[femur ap (1 of 2)]
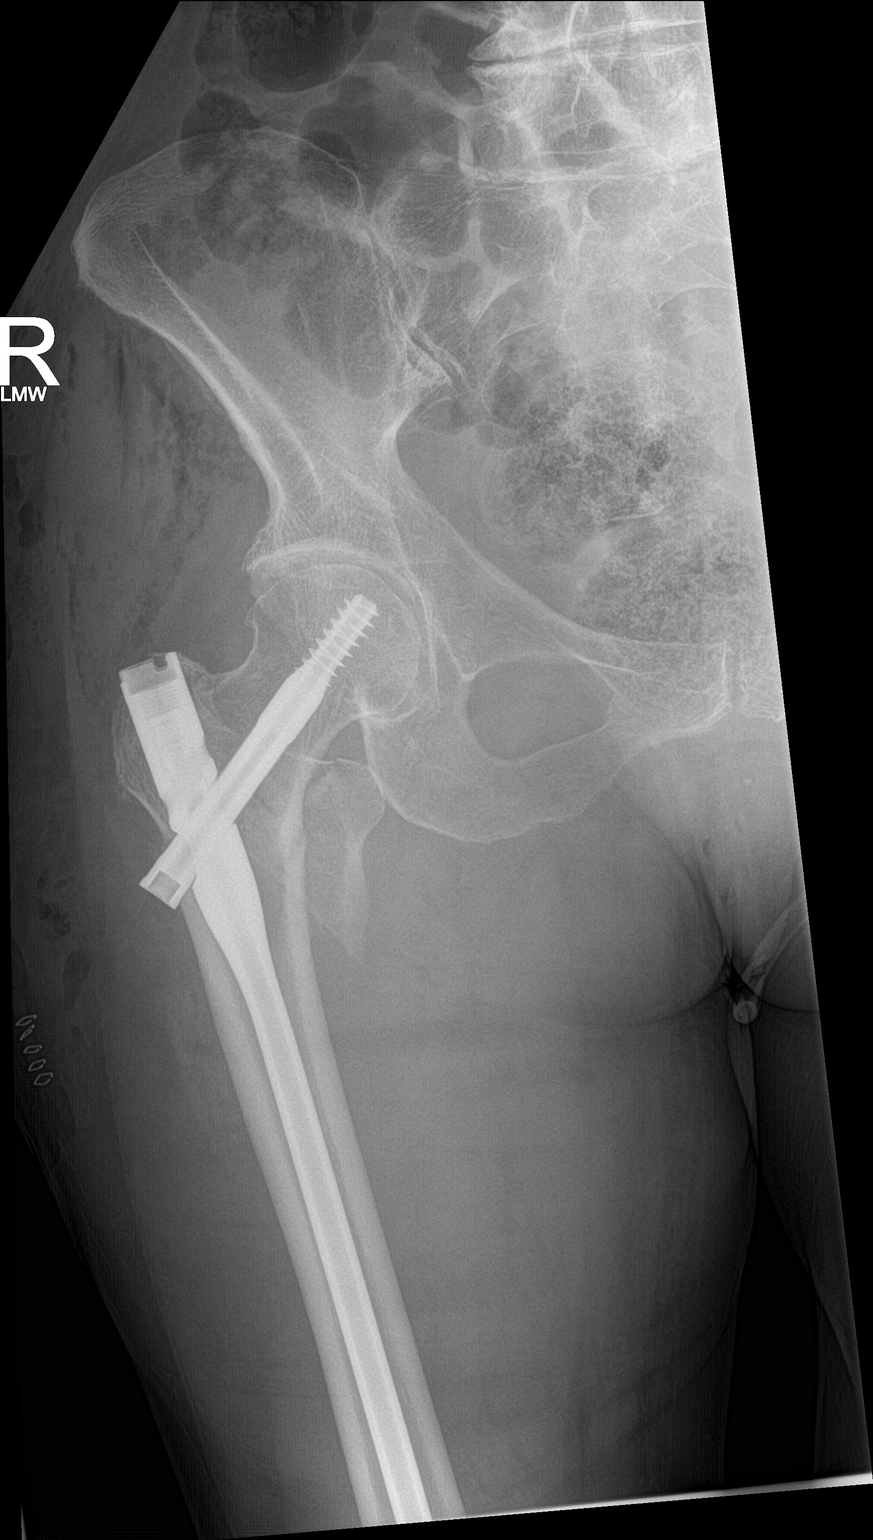

[femur lat (1 of 2)]
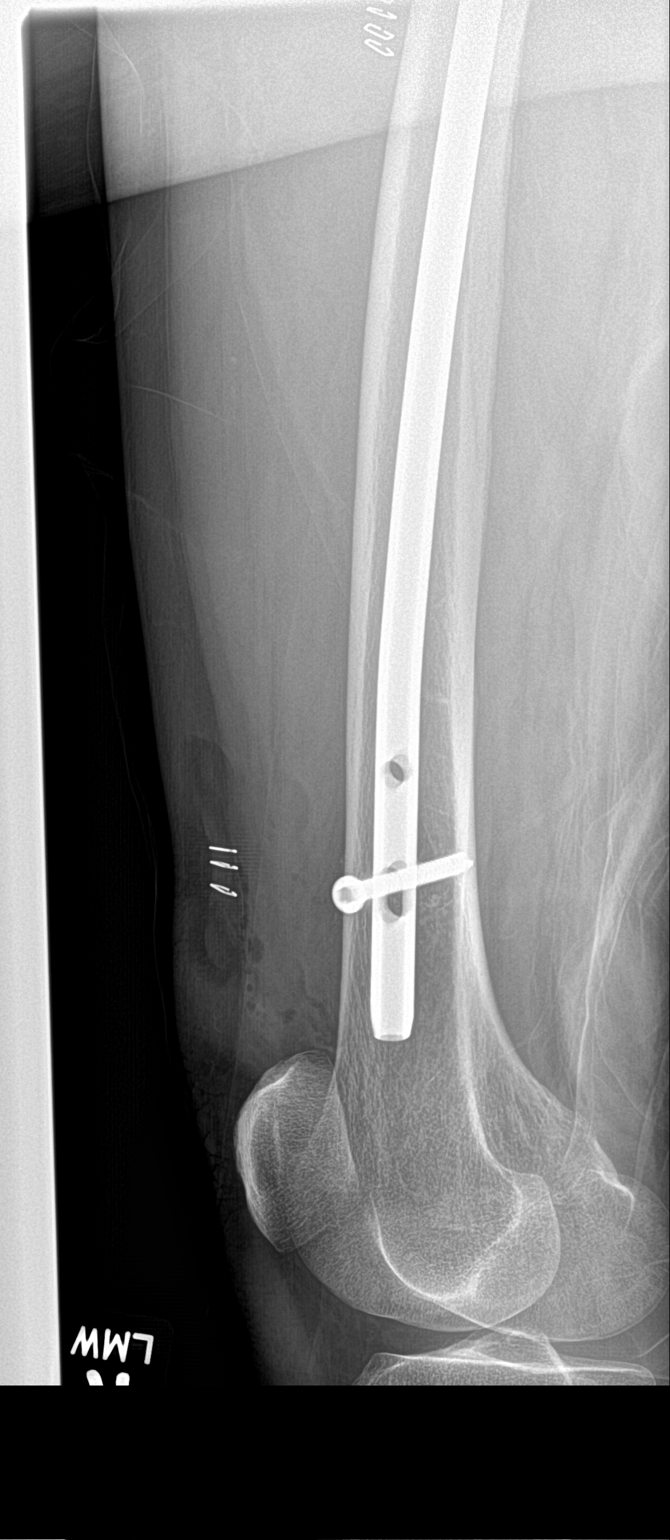

[femur ap (2 of 2)]
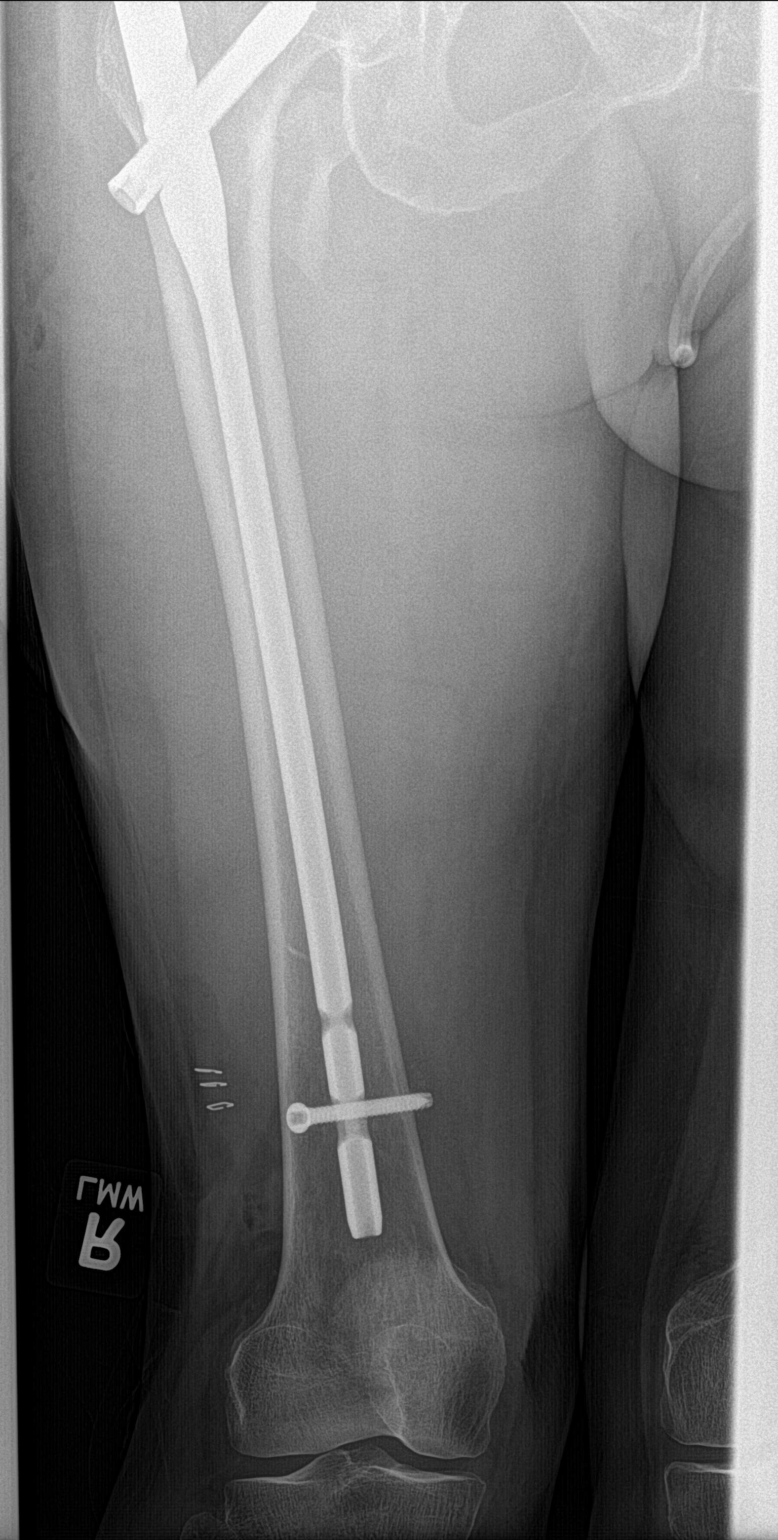

[femur lat (2 of 2)]
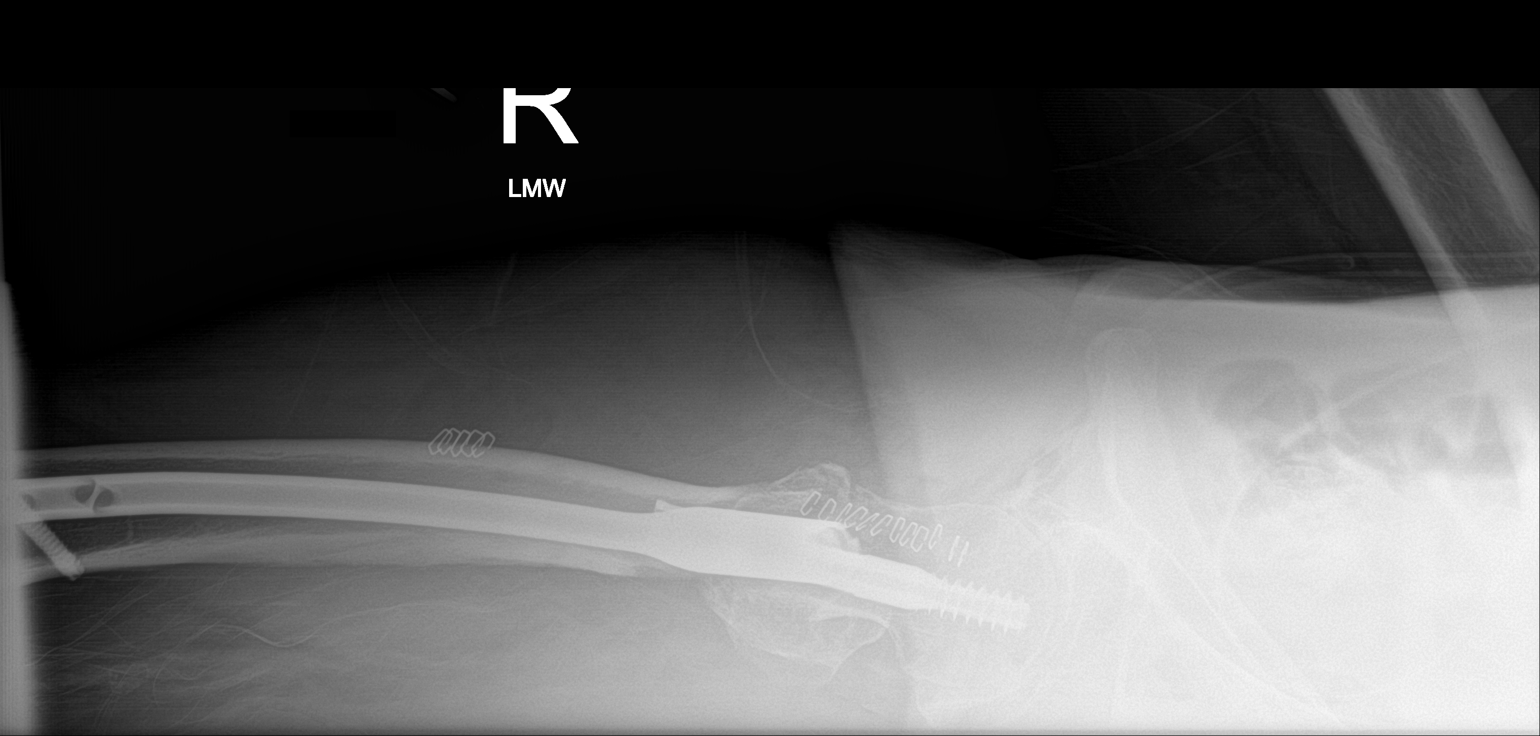

[4 of 4 positions shown; findings below may reference images not displayed]

FINDINGS: Right femur intramedullary rod and proximal threaded screw
traversing femoral neck. Avulsion fracture of the lesser trochanter
with medial displacement. Air and edema within soft tissues
compatible with postsurgical change and lateral thigh skin staples.
No new acute fracture identified.
IMPRESSION: Right proximal femoral comminuted intratrochanteric fracture
fixation with expected postsurgical changes. No new acute fracture.

By: Nadeem Goodwin M.D.

## 2018-09-18 DIAGNOSIS — M62838 Other muscle spasm: Secondary | ICD-10-CM | POA: Diagnosis not present

## 2018-09-18 DIAGNOSIS — M5412 Radiculopathy, cervical region: Secondary | ICD-10-CM | POA: Diagnosis not present

## 2018-09-18 DIAGNOSIS — M47812 Spondylosis without myelopathy or radiculopathy, cervical region: Secondary | ICD-10-CM | POA: Diagnosis not present

## 2018-09-18 DIAGNOSIS — M542 Cervicalgia: Secondary | ICD-10-CM | POA: Diagnosis not present

## 2018-09-18 DIAGNOSIS — M5030 Other cervical disc degeneration, unspecified cervical region: Secondary | ICD-10-CM | POA: Diagnosis not present

## 2018-09-25 DIAGNOSIS — N183 Chronic kidney disease, stage 3 (moderate): Secondary | ICD-10-CM | POA: Diagnosis not present

## 2018-09-25 DIAGNOSIS — E7849 Other hyperlipidemia: Secondary | ICD-10-CM | POA: Diagnosis not present

## 2018-09-25 DIAGNOSIS — M5136 Other intervertebral disc degeneration, lumbar region: Secondary | ICD-10-CM | POA: Diagnosis not present

## 2018-09-25 DIAGNOSIS — F331 Major depressive disorder, recurrent, moderate: Secondary | ICD-10-CM | POA: Diagnosis not present

## 2018-09-25 DIAGNOSIS — G250 Essential tremor: Secondary | ICD-10-CM | POA: Diagnosis not present

## 2018-09-25 DIAGNOSIS — G4733 Obstructive sleep apnea (adult) (pediatric): Secondary | ICD-10-CM | POA: Diagnosis not present

## 2018-09-25 DIAGNOSIS — I10 Essential (primary) hypertension: Secondary | ICD-10-CM | POA: Diagnosis not present

## 2018-09-25 DIAGNOSIS — F0390 Unspecified dementia without behavioral disturbance: Secondary | ICD-10-CM | POA: Diagnosis not present

## 2018-09-25 DIAGNOSIS — E034 Atrophy of thyroid (acquired): Secondary | ICD-10-CM | POA: Diagnosis not present

## 2018-09-30 DIAGNOSIS — F0390 Unspecified dementia without behavioral disturbance: Secondary | ICD-10-CM | POA: Diagnosis not present

## 2018-09-30 DIAGNOSIS — N183 Chronic kidney disease, stage 3 (moderate): Secondary | ICD-10-CM | POA: Diagnosis not present

## 2018-09-30 DIAGNOSIS — R399 Unspecified symptoms and signs involving the genitourinary system: Secondary | ICD-10-CM | POA: Diagnosis not present

## 2018-09-30 DIAGNOSIS — I10 Essential (primary) hypertension: Secondary | ICD-10-CM | POA: Diagnosis not present

## 2018-09-30 DIAGNOSIS — G4733 Obstructive sleep apnea (adult) (pediatric): Secondary | ICD-10-CM | POA: Diagnosis not present

## 2018-09-30 DIAGNOSIS — Z0000 Encounter for general adult medical examination without abnormal findings: Secondary | ICD-10-CM | POA: Diagnosis not present

## 2018-09-30 DIAGNOSIS — M5136 Other intervertebral disc degeneration, lumbar region: Secondary | ICD-10-CM | POA: Diagnosis not present

## 2018-09-30 DIAGNOSIS — E034 Atrophy of thyroid (acquired): Secondary | ICD-10-CM | POA: Diagnosis not present

## 2018-09-30 DIAGNOSIS — G250 Essential tremor: Secondary | ICD-10-CM | POA: Diagnosis not present

## 2018-10-01 DIAGNOSIS — R399 Unspecified symptoms and signs involving the genitourinary system: Secondary | ICD-10-CM | POA: Diagnosis not present

## 2018-10-02 DIAGNOSIS — D631 Anemia in chronic kidney disease: Secondary | ICD-10-CM | POA: Diagnosis not present

## 2018-10-02 DIAGNOSIS — M1990 Unspecified osteoarthritis, unspecified site: Secondary | ICD-10-CM | POA: Diagnosis not present

## 2018-10-02 DIAGNOSIS — G309 Alzheimer's disease, unspecified: Secondary | ICD-10-CM | POA: Diagnosis not present

## 2018-10-02 DIAGNOSIS — M5136 Other intervertebral disc degeneration, lumbar region: Secondary | ICD-10-CM | POA: Diagnosis not present

## 2018-10-02 DIAGNOSIS — M47812 Spondylosis without myelopathy or radiculopathy, cervical region: Secondary | ICD-10-CM | POA: Diagnosis not present

## 2018-10-02 DIAGNOSIS — N183 Chronic kidney disease, stage 3 (moderate): Secondary | ICD-10-CM | POA: Diagnosis not present

## 2018-10-02 DIAGNOSIS — F0280 Dementia in other diseases classified elsewhere without behavioral disturbance: Secondary | ICD-10-CM | POA: Diagnosis not present

## 2018-10-02 DIAGNOSIS — I69354 Hemiplegia and hemiparesis following cerebral infarction affecting left non-dominant side: Secondary | ICD-10-CM | POA: Diagnosis not present

## 2018-10-02 DIAGNOSIS — I129 Hypertensive chronic kidney disease with stage 1 through stage 4 chronic kidney disease, or unspecified chronic kidney disease: Secondary | ICD-10-CM | POA: Diagnosis not present

## 2018-10-07 DIAGNOSIS — F0280 Dementia in other diseases classified elsewhere without behavioral disturbance: Secondary | ICD-10-CM | POA: Diagnosis not present

## 2018-10-07 DIAGNOSIS — I129 Hypertensive chronic kidney disease with stage 1 through stage 4 chronic kidney disease, or unspecified chronic kidney disease: Secondary | ICD-10-CM | POA: Diagnosis not present

## 2018-10-07 DIAGNOSIS — I69354 Hemiplegia and hemiparesis following cerebral infarction affecting left non-dominant side: Secondary | ICD-10-CM | POA: Diagnosis not present

## 2018-10-07 DIAGNOSIS — D631 Anemia in chronic kidney disease: Secondary | ICD-10-CM | POA: Diagnosis not present

## 2018-10-07 DIAGNOSIS — G309 Alzheimer's disease, unspecified: Secondary | ICD-10-CM | POA: Diagnosis not present

## 2018-10-07 DIAGNOSIS — M1990 Unspecified osteoarthritis, unspecified site: Secondary | ICD-10-CM | POA: Diagnosis not present

## 2018-10-07 DIAGNOSIS — M5136 Other intervertebral disc degeneration, lumbar region: Secondary | ICD-10-CM | POA: Diagnosis not present

## 2018-10-07 DIAGNOSIS — M47812 Spondylosis without myelopathy or radiculopathy, cervical region: Secondary | ICD-10-CM | POA: Diagnosis not present

## 2018-10-07 DIAGNOSIS — N183 Chronic kidney disease, stage 3 (moderate): Secondary | ICD-10-CM | POA: Diagnosis not present

## 2018-10-13 DIAGNOSIS — M47812 Spondylosis without myelopathy or radiculopathy, cervical region: Secondary | ICD-10-CM | POA: Diagnosis not present

## 2018-10-13 DIAGNOSIS — G309 Alzheimer's disease, unspecified: Secondary | ICD-10-CM | POA: Diagnosis not present

## 2018-10-13 DIAGNOSIS — F0280 Dementia in other diseases classified elsewhere without behavioral disturbance: Secondary | ICD-10-CM | POA: Diagnosis not present

## 2018-10-13 DIAGNOSIS — M5136 Other intervertebral disc degeneration, lumbar region: Secondary | ICD-10-CM | POA: Diagnosis not present

## 2018-10-13 DIAGNOSIS — D631 Anemia in chronic kidney disease: Secondary | ICD-10-CM | POA: Diagnosis not present

## 2018-10-13 DIAGNOSIS — M1990 Unspecified osteoarthritis, unspecified site: Secondary | ICD-10-CM | POA: Diagnosis not present

## 2018-10-13 DIAGNOSIS — I69354 Hemiplegia and hemiparesis following cerebral infarction affecting left non-dominant side: Secondary | ICD-10-CM | POA: Diagnosis not present

## 2018-10-13 DIAGNOSIS — I129 Hypertensive chronic kidney disease with stage 1 through stage 4 chronic kidney disease, or unspecified chronic kidney disease: Secondary | ICD-10-CM | POA: Diagnosis not present

## 2018-10-13 DIAGNOSIS — N183 Chronic kidney disease, stage 3 (moderate): Secondary | ICD-10-CM | POA: Diagnosis not present

## 2018-10-22 DIAGNOSIS — M47812 Spondylosis without myelopathy or radiculopathy, cervical region: Secondary | ICD-10-CM | POA: Diagnosis not present

## 2018-10-22 DIAGNOSIS — G309 Alzheimer's disease, unspecified: Secondary | ICD-10-CM | POA: Diagnosis not present

## 2018-10-22 DIAGNOSIS — I69354 Hemiplegia and hemiparesis following cerebral infarction affecting left non-dominant side: Secondary | ICD-10-CM | POA: Diagnosis not present

## 2018-10-22 DIAGNOSIS — D631 Anemia in chronic kidney disease: Secondary | ICD-10-CM | POA: Diagnosis not present

## 2018-10-22 DIAGNOSIS — N183 Chronic kidney disease, stage 3 (moderate): Secondary | ICD-10-CM | POA: Diagnosis not present

## 2018-10-22 DIAGNOSIS — M1990 Unspecified osteoarthritis, unspecified site: Secondary | ICD-10-CM | POA: Diagnosis not present

## 2018-10-22 DIAGNOSIS — I129 Hypertensive chronic kidney disease with stage 1 through stage 4 chronic kidney disease, or unspecified chronic kidney disease: Secondary | ICD-10-CM | POA: Diagnosis not present

## 2018-10-22 DIAGNOSIS — M5136 Other intervertebral disc degeneration, lumbar region: Secondary | ICD-10-CM | POA: Diagnosis not present

## 2018-10-22 DIAGNOSIS — F0280 Dementia in other diseases classified elsewhere without behavioral disturbance: Secondary | ICD-10-CM | POA: Diagnosis not present

## 2018-10-26 DIAGNOSIS — D631 Anemia in chronic kidney disease: Secondary | ICD-10-CM | POA: Diagnosis not present

## 2018-10-26 DIAGNOSIS — I129 Hypertensive chronic kidney disease with stage 1 through stage 4 chronic kidney disease, or unspecified chronic kidney disease: Secondary | ICD-10-CM | POA: Diagnosis not present

## 2018-10-26 DIAGNOSIS — Z20828 Contact with and (suspected) exposure to other viral communicable diseases: Secondary | ICD-10-CM | POA: Diagnosis not present

## 2018-10-26 DIAGNOSIS — N183 Chronic kidney disease, stage 3 (moderate): Secondary | ICD-10-CM | POA: Diagnosis not present

## 2018-10-26 DIAGNOSIS — G309 Alzheimer's disease, unspecified: Secondary | ICD-10-CM | POA: Diagnosis not present

## 2018-10-26 DIAGNOSIS — M5136 Other intervertebral disc degeneration, lumbar region: Secondary | ICD-10-CM | POA: Diagnosis not present

## 2018-10-26 DIAGNOSIS — M47812 Spondylosis without myelopathy or radiculopathy, cervical region: Secondary | ICD-10-CM | POA: Diagnosis not present

## 2018-10-26 DIAGNOSIS — I69354 Hemiplegia and hemiparesis following cerebral infarction affecting left non-dominant side: Secondary | ICD-10-CM | POA: Diagnosis not present

## 2018-10-26 DIAGNOSIS — F0280 Dementia in other diseases classified elsewhere without behavioral disturbance: Secondary | ICD-10-CM | POA: Diagnosis not present

## 2018-10-26 DIAGNOSIS — M1990 Unspecified osteoarthritis, unspecified site: Secondary | ICD-10-CM | POA: Diagnosis not present

## 2018-10-28 DIAGNOSIS — N183 Chronic kidney disease, stage 3 (moderate): Secondary | ICD-10-CM | POA: Diagnosis not present

## 2018-10-28 DIAGNOSIS — I129 Hypertensive chronic kidney disease with stage 1 through stage 4 chronic kidney disease, or unspecified chronic kidney disease: Secondary | ICD-10-CM | POA: Diagnosis not present

## 2018-10-28 DIAGNOSIS — M5136 Other intervertebral disc degeneration, lumbar region: Secondary | ICD-10-CM | POA: Diagnosis not present

## 2018-10-28 DIAGNOSIS — M47812 Spondylosis without myelopathy or radiculopathy, cervical region: Secondary | ICD-10-CM | POA: Diagnosis not present

## 2018-10-28 DIAGNOSIS — D631 Anemia in chronic kidney disease: Secondary | ICD-10-CM | POA: Diagnosis not present

## 2018-10-28 DIAGNOSIS — M1990 Unspecified osteoarthritis, unspecified site: Secondary | ICD-10-CM | POA: Diagnosis not present

## 2018-10-28 DIAGNOSIS — G309 Alzheimer's disease, unspecified: Secondary | ICD-10-CM | POA: Diagnosis not present

## 2018-10-28 DIAGNOSIS — I69354 Hemiplegia and hemiparesis following cerebral infarction affecting left non-dominant side: Secondary | ICD-10-CM | POA: Diagnosis not present

## 2018-10-29 DIAGNOSIS — Z20828 Contact with and (suspected) exposure to other viral communicable diseases: Secondary | ICD-10-CM | POA: Diagnosis not present

## 2018-11-03 DIAGNOSIS — D631 Anemia in chronic kidney disease: Secondary | ICD-10-CM | POA: Diagnosis not present

## 2018-11-03 DIAGNOSIS — M1990 Unspecified osteoarthritis, unspecified site: Secondary | ICD-10-CM | POA: Diagnosis not present

## 2018-11-03 DIAGNOSIS — N183 Chronic kidney disease, stage 3 (moderate): Secondary | ICD-10-CM | POA: Diagnosis not present

## 2018-11-03 DIAGNOSIS — M5136 Other intervertebral disc degeneration, lumbar region: Secondary | ICD-10-CM | POA: Diagnosis not present

## 2018-11-03 DIAGNOSIS — M47812 Spondylosis without myelopathy or radiculopathy, cervical region: Secondary | ICD-10-CM | POA: Diagnosis not present

## 2018-11-03 DIAGNOSIS — F0280 Dementia in other diseases classified elsewhere without behavioral disturbance: Secondary | ICD-10-CM | POA: Diagnosis not present

## 2018-11-03 DIAGNOSIS — G309 Alzheimer's disease, unspecified: Secondary | ICD-10-CM | POA: Diagnosis not present

## 2018-11-03 DIAGNOSIS — I129 Hypertensive chronic kidney disease with stage 1 through stage 4 chronic kidney disease, or unspecified chronic kidney disease: Secondary | ICD-10-CM | POA: Diagnosis not present

## 2018-11-03 DIAGNOSIS — I69354 Hemiplegia and hemiparesis following cerebral infarction affecting left non-dominant side: Secondary | ICD-10-CM | POA: Diagnosis not present

## 2018-11-19 DIAGNOSIS — Z20828 Contact with and (suspected) exposure to other viral communicable diseases: Secondary | ICD-10-CM | POA: Diagnosis not present

## 2018-12-03 DIAGNOSIS — Z20828 Contact with and (suspected) exposure to other viral communicable diseases: Secondary | ICD-10-CM | POA: Diagnosis not present

## 2018-12-04 DIAGNOSIS — Z20828 Contact with and (suspected) exposure to other viral communicable diseases: Secondary | ICD-10-CM | POA: Diagnosis not present

## 2018-12-30 DIAGNOSIS — I69354 Hemiplegia and hemiparesis following cerebral infarction affecting left non-dominant side: Secondary | ICD-10-CM | POA: Diagnosis not present

## 2018-12-30 DIAGNOSIS — D631 Anemia in chronic kidney disease: Secondary | ICD-10-CM | POA: Diagnosis not present

## 2018-12-30 DIAGNOSIS — I129 Hypertensive chronic kidney disease with stage 1 through stage 4 chronic kidney disease, or unspecified chronic kidney disease: Secondary | ICD-10-CM | POA: Diagnosis not present

## 2018-12-30 DIAGNOSIS — F0280 Dementia in other diseases classified elsewhere without behavioral disturbance: Secondary | ICD-10-CM | POA: Diagnosis not present

## 2018-12-30 DIAGNOSIS — I440 Atrioventricular block, first degree: Secondary | ICD-10-CM | POA: Diagnosis not present

## 2018-12-30 DIAGNOSIS — G309 Alzheimer's disease, unspecified: Secondary | ICD-10-CM | POA: Diagnosis not present

## 2018-12-30 DIAGNOSIS — N183 Chronic kidney disease, stage 3 unspecified: Secondary | ICD-10-CM | POA: Diagnosis not present

## 2018-12-30 DIAGNOSIS — G2581 Restless legs syndrome: Secondary | ICD-10-CM | POA: Diagnosis not present

## 2018-12-30 DIAGNOSIS — G5603 Carpal tunnel syndrome, bilateral upper limbs: Secondary | ICD-10-CM | POA: Diagnosis not present

## 2018-12-31 DIAGNOSIS — Z20828 Contact with and (suspected) exposure to other viral communicable diseases: Secondary | ICD-10-CM | POA: Diagnosis not present

## 2019-01-01 DIAGNOSIS — G309 Alzheimer's disease, unspecified: Secondary | ICD-10-CM | POA: Diagnosis not present

## 2019-01-01 DIAGNOSIS — G2581 Restless legs syndrome: Secondary | ICD-10-CM | POA: Diagnosis not present

## 2019-01-01 DIAGNOSIS — D631 Anemia in chronic kidney disease: Secondary | ICD-10-CM | POA: Diagnosis not present

## 2019-01-01 DIAGNOSIS — I129 Hypertensive chronic kidney disease with stage 1 through stage 4 chronic kidney disease, or unspecified chronic kidney disease: Secondary | ICD-10-CM | POA: Diagnosis not present

## 2019-01-01 DIAGNOSIS — I440 Atrioventricular block, first degree: Secondary | ICD-10-CM | POA: Diagnosis not present

## 2019-01-01 DIAGNOSIS — F0280 Dementia in other diseases classified elsewhere without behavioral disturbance: Secondary | ICD-10-CM | POA: Diagnosis not present

## 2019-01-01 DIAGNOSIS — G5603 Carpal tunnel syndrome, bilateral upper limbs: Secondary | ICD-10-CM | POA: Diagnosis not present

## 2019-01-01 DIAGNOSIS — N183 Chronic kidney disease, stage 3 unspecified: Secondary | ICD-10-CM | POA: Diagnosis not present

## 2019-01-01 DIAGNOSIS — I69354 Hemiplegia and hemiparesis following cerebral infarction affecting left non-dominant side: Secondary | ICD-10-CM | POA: Diagnosis not present

## 2019-01-04 DIAGNOSIS — G309 Alzheimer's disease, unspecified: Secondary | ICD-10-CM | POA: Diagnosis not present

## 2019-01-04 DIAGNOSIS — D631 Anemia in chronic kidney disease: Secondary | ICD-10-CM | POA: Diagnosis not present

## 2019-01-04 DIAGNOSIS — G5603 Carpal tunnel syndrome, bilateral upper limbs: Secondary | ICD-10-CM | POA: Diagnosis not present

## 2019-01-04 DIAGNOSIS — N183 Chronic kidney disease, stage 3 unspecified: Secondary | ICD-10-CM | POA: Diagnosis not present

## 2019-01-04 DIAGNOSIS — F0280 Dementia in other diseases classified elsewhere without behavioral disturbance: Secondary | ICD-10-CM | POA: Diagnosis not present

## 2019-01-04 DIAGNOSIS — I440 Atrioventricular block, first degree: Secondary | ICD-10-CM | POA: Diagnosis not present

## 2019-01-04 DIAGNOSIS — I129 Hypertensive chronic kidney disease with stage 1 through stage 4 chronic kidney disease, or unspecified chronic kidney disease: Secondary | ICD-10-CM | POA: Diagnosis not present

## 2019-01-04 DIAGNOSIS — G2581 Restless legs syndrome: Secondary | ICD-10-CM | POA: Diagnosis not present

## 2019-01-04 DIAGNOSIS — I69354 Hemiplegia and hemiparesis following cerebral infarction affecting left non-dominant side: Secondary | ICD-10-CM | POA: Diagnosis not present

## 2019-01-05 DIAGNOSIS — E034 Atrophy of thyroid (acquired): Secondary | ICD-10-CM | POA: Diagnosis not present

## 2019-01-05 DIAGNOSIS — G4733 Obstructive sleep apnea (adult) (pediatric): Secondary | ICD-10-CM | POA: Diagnosis not present

## 2019-01-05 DIAGNOSIS — I129 Hypertensive chronic kidney disease with stage 1 through stage 4 chronic kidney disease, or unspecified chronic kidney disease: Secondary | ICD-10-CM | POA: Diagnosis not present

## 2019-01-05 DIAGNOSIS — D649 Anemia, unspecified: Secondary | ICD-10-CM | POA: Diagnosis not present

## 2019-01-05 DIAGNOSIS — M5136 Other intervertebral disc degeneration, lumbar region: Secondary | ICD-10-CM | POA: Diagnosis not present

## 2019-01-05 DIAGNOSIS — E7849 Other hyperlipidemia: Secondary | ICD-10-CM | POA: Diagnosis not present

## 2019-01-05 DIAGNOSIS — N183 Chronic kidney disease, stage 3 unspecified: Secondary | ICD-10-CM | POA: Diagnosis not present

## 2019-01-05 DIAGNOSIS — G250 Essential tremor: Secondary | ICD-10-CM | POA: Diagnosis not present

## 2019-01-05 DIAGNOSIS — F0390 Unspecified dementia without behavioral disturbance: Secondary | ICD-10-CM | POA: Diagnosis not present

## 2019-01-06 DIAGNOSIS — G2581 Restless legs syndrome: Secondary | ICD-10-CM | POA: Diagnosis not present

## 2019-01-06 DIAGNOSIS — N183 Chronic kidney disease, stage 3 unspecified: Secondary | ICD-10-CM | POA: Diagnosis not present

## 2019-01-06 DIAGNOSIS — I129 Hypertensive chronic kidney disease with stage 1 through stage 4 chronic kidney disease, or unspecified chronic kidney disease: Secondary | ICD-10-CM | POA: Diagnosis not present

## 2019-01-06 DIAGNOSIS — I69354 Hemiplegia and hemiparesis following cerebral infarction affecting left non-dominant side: Secondary | ICD-10-CM | POA: Diagnosis not present

## 2019-01-06 DIAGNOSIS — I440 Atrioventricular block, first degree: Secondary | ICD-10-CM | POA: Diagnosis not present

## 2019-01-06 DIAGNOSIS — G309 Alzheimer's disease, unspecified: Secondary | ICD-10-CM | POA: Diagnosis not present

## 2019-01-06 DIAGNOSIS — D631 Anemia in chronic kidney disease: Secondary | ICD-10-CM | POA: Diagnosis not present

## 2019-01-06 DIAGNOSIS — G5603 Carpal tunnel syndrome, bilateral upper limbs: Secondary | ICD-10-CM | POA: Diagnosis not present

## 2019-01-06 DIAGNOSIS — F0280 Dementia in other diseases classified elsewhere without behavioral disturbance: Secondary | ICD-10-CM | POA: Diagnosis not present

## 2019-01-08 DIAGNOSIS — F0280 Dementia in other diseases classified elsewhere without behavioral disturbance: Secondary | ICD-10-CM | POA: Diagnosis not present

## 2019-01-08 DIAGNOSIS — D631 Anemia in chronic kidney disease: Secondary | ICD-10-CM | POA: Diagnosis not present

## 2019-01-08 DIAGNOSIS — N183 Chronic kidney disease, stage 3 unspecified: Secondary | ICD-10-CM | POA: Diagnosis not present

## 2019-01-08 DIAGNOSIS — I129 Hypertensive chronic kidney disease with stage 1 through stage 4 chronic kidney disease, or unspecified chronic kidney disease: Secondary | ICD-10-CM | POA: Diagnosis not present

## 2019-01-08 DIAGNOSIS — I440 Atrioventricular block, first degree: Secondary | ICD-10-CM | POA: Diagnosis not present

## 2019-01-08 DIAGNOSIS — I69354 Hemiplegia and hemiparesis following cerebral infarction affecting left non-dominant side: Secondary | ICD-10-CM | POA: Diagnosis not present

## 2019-01-08 DIAGNOSIS — G5603 Carpal tunnel syndrome, bilateral upper limbs: Secondary | ICD-10-CM | POA: Diagnosis not present

## 2019-01-08 DIAGNOSIS — G309 Alzheimer's disease, unspecified: Secondary | ICD-10-CM | POA: Diagnosis not present

## 2019-01-08 DIAGNOSIS — G2581 Restless legs syndrome: Secondary | ICD-10-CM | POA: Diagnosis not present

## 2019-01-11 DIAGNOSIS — D631 Anemia in chronic kidney disease: Secondary | ICD-10-CM | POA: Diagnosis not present

## 2019-01-11 DIAGNOSIS — I440 Atrioventricular block, first degree: Secondary | ICD-10-CM | POA: Diagnosis not present

## 2019-01-11 DIAGNOSIS — G309 Alzheimer's disease, unspecified: Secondary | ICD-10-CM | POA: Diagnosis not present

## 2019-01-11 DIAGNOSIS — N183 Chronic kidney disease, stage 3 unspecified: Secondary | ICD-10-CM | POA: Diagnosis not present

## 2019-01-11 DIAGNOSIS — I129 Hypertensive chronic kidney disease with stage 1 through stage 4 chronic kidney disease, or unspecified chronic kidney disease: Secondary | ICD-10-CM | POA: Diagnosis not present

## 2019-01-11 DIAGNOSIS — G2581 Restless legs syndrome: Secondary | ICD-10-CM | POA: Diagnosis not present

## 2019-01-11 DIAGNOSIS — G5603 Carpal tunnel syndrome, bilateral upper limbs: Secondary | ICD-10-CM | POA: Diagnosis not present

## 2019-01-11 DIAGNOSIS — I69354 Hemiplegia and hemiparesis following cerebral infarction affecting left non-dominant side: Secondary | ICD-10-CM | POA: Diagnosis not present

## 2019-01-11 DIAGNOSIS — F0280 Dementia in other diseases classified elsewhere without behavioral disturbance: Secondary | ICD-10-CM | POA: Diagnosis not present

## 2019-01-12 DIAGNOSIS — I69354 Hemiplegia and hemiparesis following cerebral infarction affecting left non-dominant side: Secondary | ICD-10-CM | POA: Diagnosis not present

## 2019-01-12 DIAGNOSIS — I440 Atrioventricular block, first degree: Secondary | ICD-10-CM | POA: Diagnosis not present

## 2019-01-12 DIAGNOSIS — G5603 Carpal tunnel syndrome, bilateral upper limbs: Secondary | ICD-10-CM | POA: Diagnosis not present

## 2019-01-12 DIAGNOSIS — F0280 Dementia in other diseases classified elsewhere without behavioral disturbance: Secondary | ICD-10-CM | POA: Diagnosis not present

## 2019-01-12 DIAGNOSIS — G309 Alzheimer's disease, unspecified: Secondary | ICD-10-CM | POA: Diagnosis not present

## 2019-01-12 DIAGNOSIS — N183 Chronic kidney disease, stage 3 unspecified: Secondary | ICD-10-CM | POA: Diagnosis not present

## 2019-01-12 DIAGNOSIS — I129 Hypertensive chronic kidney disease with stage 1 through stage 4 chronic kidney disease, or unspecified chronic kidney disease: Secondary | ICD-10-CM | POA: Diagnosis not present

## 2019-01-12 DIAGNOSIS — G2581 Restless legs syndrome: Secondary | ICD-10-CM | POA: Diagnosis not present

## 2019-01-12 DIAGNOSIS — D631 Anemia in chronic kidney disease: Secondary | ICD-10-CM | POA: Diagnosis not present

## 2019-01-13 ENCOUNTER — Emergency Department: Payer: Medicare HMO

## 2019-01-13 ENCOUNTER — Emergency Department
Admission: EM | Admit: 2019-01-13 | Discharge: 2019-01-13 | Disposition: A | Discharge status: Home / Self Care | Payer: Medicare HMO | Attending: Emergency Medicine | Admitting: Emergency Medicine

## 2019-01-13 ENCOUNTER — Other Ambulatory Visit: Payer: Self-pay

## 2019-01-13 DIAGNOSIS — W010XXA Fall on same level from slipping, tripping and stumbling without subsequent striking against object, initial encounter: Secondary | ICD-10-CM | POA: Insufficient documentation

## 2019-01-13 DIAGNOSIS — Z7982 Long term (current) use of aspirin: Secondary | ICD-10-CM | POA: Insufficient documentation

## 2019-01-13 DIAGNOSIS — N183 Chronic kidney disease, stage 3 unspecified: Secondary | ICD-10-CM | POA: Diagnosis not present

## 2019-01-13 DIAGNOSIS — S3993XA Unspecified injury of pelvis, initial encounter: Secondary | ICD-10-CM | POA: Diagnosis not present

## 2019-01-13 DIAGNOSIS — W19XXXA Unspecified fall, initial encounter: Secondary | ICD-10-CM

## 2019-01-13 DIAGNOSIS — Y92129 Unspecified place in nursing home as the place of occurrence of the external cause: Secondary | ICD-10-CM | POA: Insufficient documentation

## 2019-01-13 DIAGNOSIS — F0390 Unspecified dementia without behavioral disturbance: Secondary | ICD-10-CM | POA: Insufficient documentation

## 2019-01-13 DIAGNOSIS — I959 Hypotension, unspecified: Secondary | ICD-10-CM | POA: Diagnosis not present

## 2019-01-13 DIAGNOSIS — R404 Transient alteration of awareness: Secondary | ICD-10-CM | POA: Diagnosis not present

## 2019-01-13 DIAGNOSIS — Y939 Activity, unspecified: Secondary | ICD-10-CM | POA: Insufficient documentation

## 2019-01-13 DIAGNOSIS — S299XXA Unspecified injury of thorax, initial encounter: Secondary | ICD-10-CM | POA: Diagnosis not present

## 2019-01-13 DIAGNOSIS — Z79899 Other long term (current) drug therapy: Secondary | ICD-10-CM | POA: Insufficient documentation

## 2019-01-13 DIAGNOSIS — I129 Hypertensive chronic kidney disease with stage 1 through stage 4 chronic kidney disease, or unspecified chronic kidney disease: Secondary | ICD-10-CM | POA: Diagnosis not present

## 2019-01-13 DIAGNOSIS — Y999 Unspecified external cause status: Secondary | ICD-10-CM | POA: Diagnosis not present

## 2019-01-13 DIAGNOSIS — Z043 Encounter for examination and observation following other accident: Secondary | ICD-10-CM | POA: Diagnosis not present

## 2019-01-13 DIAGNOSIS — S199XXA Unspecified injury of neck, initial encounter: Secondary | ICD-10-CM | POA: Diagnosis not present

## 2019-01-13 DIAGNOSIS — S0990XA Unspecified injury of head, initial encounter: Secondary | ICD-10-CM | POA: Diagnosis not present

## 2019-01-13 DIAGNOSIS — E039 Hypothyroidism, unspecified: Secondary | ICD-10-CM | POA: Insufficient documentation

## 2019-01-13 DIAGNOSIS — R001 Bradycardia, unspecified: Secondary | ICD-10-CM | POA: Diagnosis not present

## 2019-01-13 LAB — BASIC METABOLIC PANEL
Anion gap: 8 (ref 5–15)
BUN: 41 mg/dL — ABNORMAL HIGH (ref 8–23)
CO2: 27 mmol/L (ref 22–32)
Calcium: 9.2 mg/dL (ref 8.9–10.3)
Chloride: 105 mmol/L (ref 98–111)
Creatinine, Ser: 2.03 mg/dL — ABNORMAL HIGH (ref 0.44–1.00)
GFR calc Af Amer: 25 mL/min — ABNORMAL LOW (ref >60)
GFR calc non Af Amer: 22 mL/min — ABNORMAL LOW (ref >60)
Glucose, Bld: 96 mg/dL (ref 70–99)
Potassium: 4.6 mmol/L (ref 3.5–5.1)
Sodium: 140 mmol/L (ref 135–145)

## 2019-01-13 LAB — CBC WITH DIFFERENTIAL/PLATELET
Abs Immature Granulocytes: 0.01 10*3/uL (ref 0.00–0.07)
Basophils Absolute: 0.1 10*3/uL (ref 0.0–0.1)
Basophils Relative: 1 %
Eosinophils Absolute: 0.2 10*3/uL (ref 0.0–0.5)
Eosinophils Relative: 3 %
HCT: 29.8 % — ABNORMAL LOW (ref 36.0–46.0)
Hemoglobin: 9.2 g/dL — ABNORMAL LOW (ref 12.0–15.0)
Immature Granulocytes: 0 %
Lymphocytes Relative: 34 %
Lymphs Abs: 2.5 10*3/uL (ref 0.7–4.0)
MCH: 32.6 pg (ref 26.0–34.0)
MCHC: 30.9 g/dL (ref 30.0–36.0)
MCV: 105.7 fL — ABNORMAL HIGH (ref 80.0–100.0)
Monocytes Absolute: 0.9 10*3/uL (ref 0.1–1.0)
Monocytes Relative: 12 %
Neutro Abs: 3.7 10*3/uL (ref 1.7–7.7)
Neutrophils Relative %: 50 %
Platelets: 186 10*3/uL (ref 150–400)
RBC: 2.82 MIL/uL — ABNORMAL LOW (ref 3.87–5.11)
RDW: 12.4 % (ref 11.5–15.5)
WBC: 7.3 10*3/uL (ref 4.0–10.5)
nRBC: 0 % (ref 0.0–0.2)

## 2019-01-13 MED ORDER — ACETAMINOPHEN 500 MG PO TABS
1000.0000 mg | ORAL_TABLET | Freq: Once | ORAL | Status: AC
  Administered 2019-01-13: 1000 mg via ORAL
  Filled 2019-01-13: qty 2

## 2019-01-13 NOTE — Discharge Instructions (Addendum)
Slightly low hemoglobin of 9.2. Given elevated MCV most likely secondary to folate or B12 deficiency.  Her kidney function was also slightly elevated at 2.0.  Daughter says that she does not eat and drink very well.  This is most likely been slowly elevating given her prior was 1.4.    Pt will need to follow this up with primary doctor in next few weeks.   CT imaging/xrays were negative.  Return to ER for any other concerns.

## 2019-01-13 NOTE — ED Triage Notes (Signed)
Pt [facility] [place] for unwitnessed fall. Found on L side. Hx dementia. Not complaining of pain. Was able to walk with EMS. No blood thinner use. Unsure if hit head. VSS with EMS.

## 2019-01-13 NOTE — ED Provider Notes (Signed)
[facility] Emergency Department Provider Note  ____________________________________________   First MD Initiated Contact with Patient 01/13/19 1159     (approximate)  I have reviewed the triage vital signs and the nursing notes.   HISTORY  Chief Complaint Fall    HPI Carrie Calhoun is a 85 y.o. female with 1st degree AV block, hypertension, dementia who comes in from home [facility] for unwitnessed fall.  Patient was found on her left side.  She was able to get up and walk with EMS.  She is a walker at baseline.  There was concern that she might of hit her head.  Patient says she has some very mild neck pain that is constant, nothing makes better, nothing makes it worse.  I discussed with patient's daughter over the phone who said that at baseline she is very slow to answer questions due to her history of dementia.  She is chronic neck pain.     Past Medical History:  Diagnosis Date   Anemia, unspecified    Anxiety    Atrioventricular block 06/09/2008   1st degree with bradycardia   Cardiac arrest (HCC)    Chicken pox    CKD (chronic kidney disease) stage 3, GFR 30-59 ml/min (HCC)    Diverticulosis    History of cardiac arrest    during prior cataract surgery, thought to be from anesthesia, per pt. Followed by Dr. Darrold Junker   Hyperlipidemia, unspecified    Hypertension    Hypothyroidism, unspecified    Osteopenia    Renal disorder     Patient Active Problem List   Diagnosis Date Noted   DNR (do not resuscitate) 09/12/2016   Hip fracture (HCC) 09/10/2016   Osteopenia 09/02/2016   Hyperlipidemia, unspecified 09/02/2016   DDD (degenerative disc disease), lumbar 09/02/2016   CKD (chronic kidney disease), stage III 09/02/2016   Anemia, unspecified 09/02/2016   Ataxia 07/22/2016   Dementia arising in the senium and presenium (HCC) 07/15/2016   Multiple rib fractures 07/04/2016   Moderate major  depression, single episode (HCC) 06/14/2016   Difficulty walking 01/08/2016   Hypothyroidism due to acquired atrophy of thyroid 07/21/2015   Paresthesia of both hands 06/28/2014   Bilateral carpal tunnel syndrome 06/28/2014   Tremor 12/28/2013   Restless legs 12/28/2013   Loss of memory 12/28/2013   Essential hypertension 12/28/2013    Past Surgical History:  Procedure Laterality Date   APPENDECTOMY  1970   CATARACT EXTRACTION, BILATERAL     COLONOSCOPY  04/29/2006   FOOT SURGERY Bilateral    INTRAMEDULLARY (IM) NAIL INTERTROCHANTERIC Right 09/10/2016   Procedure: INTRAMEDULLARY (IM) NAIL INTERTROCHANTRIC;  Surgeon: [name], MD;  Location: [facility] ORS;  Service: Orthopedics;  Laterality: Right;   TUBAL LIGATION Bilateral 1970    Prior to Admission medications   Medication Sig Start Date End Date Taking? Authorizing Provider  acetaminophen (TYLENOL) 325 MG tablet Take 2 tablets (650 mg total) by mouth every 6 (six) hours as needed for mild pain (or Fever >/= 101). 07/08/16   Milagros Loll, MD  acetaminophen (TYLENOL) 325 MG tablet Take 650 mg by mouth 4 (four) times daily.    [provider]  aspirin EC 81 MG tablet Take 1 tablet by mouth daily.    [provider]  busPIRone (BUSPAR) 15 MG tablet Take 1 tablet by mouth daily.  05/13/16   [provider]  Calcium Carbonate-Vitamin D 600-400 MG-UNIT tablet Take 1 tablet by mouth daily.  [provider]  citalopram (CELEXA) 10 MG tablet Take 1 tablet (10 mg total) by mouth every other day. 07/08/16   Milagros LollSudini, Srikar, MD  docusate sodium (COLACE) 100 MG capsule Take 1 capsule (100 mg total) by mouth 2 (two) times daily. Patient not taking: Reported on 11/18/2016 09/13/16   Milagros LollSudini, Srikar, MD  donepezil (ARICEPT) 5 MG tablet Take 1 tablet by mouth daily.  05/13/16   [provider]  levothyroxine (SYNTHROID, LEVOTHROID) 75 MCG tablet Take 1 tablet by mouth daily before breakfast.   03/11/16   [provider]  Lifitegrast 5 % SOLN Place 1 drop into both eyes 2 (two) times daily.     [provider]  methocarbamol (ROBAXIN) 500 MG tablet Take 1 tablet (500 mg total) by mouth every 6 (six) hours as needed for muscle spasms. Patient not taking: Reported on 11/18/2016 09/13/16   Milagros LollSudini, Srikar, MD  mirabegron ER (MYRBETRIQ) 25 MG TB24 tablet Take 1 tablet (25 mg total) by mouth daily. Patient not taking: Reported on 11/18/2016 09/02/16   Michiel CowboyMcGowan, Shannon A, PA-C  Multiple Vitamin (MULTIVITAMIN) tablet Take 1 tablet by mouth daily.    [provider]  Omega-3 Fatty Acids (FISH OIL PO) Take 1 tablet by mouth daily.    [provider]  traMADol (ULTRAM) 50 MG tablet Take 1 tablet (50 mg total) by mouth every 4 (four) hours as needed. Patient not taking: Reported on 11/18/2016 10/14/16   Lorenso QuarryLeach, Shannon, NP  traMADol (ULTRAM) 50 MG tablet Take 1 tablet (50 mg total) by mouth 4 (four) times daily. Scheduled for pain. Ok to give prn doses in addition to scheduled for severe pain Patient not taking: Reported on 11/18/2016 10/14/16   Lorenso QuarryLeach, Shannon, NP    Allergies Alendronate, Lidocaine, Phenylephrine, Phenyltoloxamine, and Azithromycin  Family History  Problem Relation Age of Onset   Hypertension Mother    Stroke Mother    Osteoporosis Mother    Thyroid disease Mother    Breast cancer Maternal Aunt    Breast cancer Maternal Aunt     Social History Social History   Tobacco Use   Smoking status: Never Smoker   Smokeless tobacco: Never Used  Substance Use Topics   Alcohol use: No   Drug use: No      Review of Systems Constitutional: No fever/chills, fall Eyes: No visual changes. ENT: No sore throat. Cardiovascular: Denies chest pain. Respiratory: Denies shortness of breath. Gastrointestinal: No abdominal pain.  No nausea, no vomiting.  No diarrhea.  No constipation. Genitourinary: Negative for dysuria. Musculoskeletal:  Negative for back pain.  Neck pain Skin: Negative for rash. Neurological: Negative for headaches, focal weakness or numbness. All other ROS negative ____________________________________________   PHYSICAL EXAM:  VITAL SIGNS: Blood pressure (!) 139/59, pulse (!) 45, temperature 97.9 F (36.6 C), temperature source Oral, resp. rate 14, height 5\' 4"  (1.626 m), weight 54.4 kg, SpO2 100 %.   Constitutional: Alert and oriented x1.  Slow to answer questions.  Is able to follow simple commands. Eyes: Conjunctivae are normal. EOMI. Head: Atraumatic. Nose: No congestion/rhinnorhea. Mouth/Throat: Mucous membranes are moist.   Neck: No stridor. Trachea Midline. FROM Cardiovascular: Normal rate, regular rhythm. Grossly normal heart sounds.  Good peripheral circulation. No chest wall tenderness Respiratory: Normal respiratory effort.  No retractions. Lungs CTAB. Gastrointestinal: Soft and nontender. No distention. No abdominal bruits.  Musculoskeletal:   RUE: No point tenderness, deformity or other signs of injury. Radial pulse intact. Neuro intact. Full ROM in joint.  LUE: No point tenderness, deformity or other signs of injury. Radial pulse intact. Neuro intact. Full ROM in joints RLE: No point tenderness, deformity or other signs of injury. DP pulse intact. Neuro intact. Full ROM in joints. LLE: No point tenderness, deformity or other signs of injury. DP pulse intact. Neuro intact. Full ROM in joints. Neurologic: Slow to respond to questions.  No gross focal neurologic deficits are appreciated.  Equal strength in her arms and her legs. Skin:  Skin is warm, dry and intact. No rash noted. Psychiatric: Mood and affect are normal. Speech and behavior are normal. GU: Deferred   ____________________________________________   LABS (all labs ordered are listed, but only abnormal results are displayed)  Labs Reviewed  BASIC METABOLIC PANEL - Abnormal; Notable for the following components:       Result Value   BUN 41 (*)    Creatinine, Ser 2.03 (*)    GFR calc non Af Amer 22 (*)    GFR calc Af Amer 25 (*)    All other components within normal limits  CBC WITH DIFFERENTIAL/PLATELET - Abnormal; Notable for the following components:   RBC 2.82 (*)    Hemoglobin 9.2 (*)    HCT 29.8 (*)    MCV 105.7 (*)    All other components within normal limits  CBC WITH DIFFERENTIAL/PLATELET   ____________________________________________   ED ECG REPORT I, Concha Se, the attending physician, personally viewed and interpreted this ECG.  EKG sinus brady of 50, type I AV block, no ST elevation, no T wave inversions, low amplitude, looks similar to prior EKGs. ____________________________________________  RADIOLOGY Vela Prose, personally viewed and evaluated these images (plain radiographs) as part of my medical decision making, as well as reviewing the written report by the radiologist.  ED MD interpretation: X-rays reviewed without fractures.  Official radiology report(s): Ct Head Wo Contrast  Result Date: 01/13/2019 CLINICAL DATA:  Minor head trauma. EXAM: CT HEAD WITHOUT CONTRAST TECHNIQUE: Contiguous axial images were obtained from the base of the skull through the vertex without intravenous contrast. COMPARISON:  09/14/2017 FINDINGS: Brain: There is no evidence for acute hemorrhage, hydrocephalus, mass lesion, or abnormal extra-axial fluid collection. No definite CT evidence for acute infarction. Diffuse loss of parenchymal volume is consistent with atrophy. Patchy low attenuation in the deep hemispheric and periventricular white matter is nonspecific, but likely reflects chronic microvascular ischemic demyelination. Vascular: No hyperdense vessel or unexpected calcification. Skull: No evidence for fracture. No worrisome lytic or sclerotic lesion. Sinuses/Orbits: The visualized paranasal sinuses and mastoid air cells are clear. The Visualized portions of the globes and intraorbital  fat are unremarkable. Other: None. IMPRESSION: 1. Stable.  No acute intracranial abnormality. 2. Atrophy with chronic small vessel white matter ischemic disease. Electronically Signed   By: Kennith Center M.D.   On: 01/13/2019 12:39   Ct Cervical Spine Wo Contrast  Result Date: 01/13/2019 CLINICAL DATA:  Minor trauma.  Unwitnessed fall. EXAM: CT CERVICAL SPINE WITHOUT CONTRAST TECHNIQUE: Multidetector CT imaging of the cervical spine was performed without intravenous contrast. Multiplanar CT image reconstructions were also generated. COMPARISON:  03/11/2018 FINDINGS: Alignment: Normal. Skull base and vertebrae: No acute fracture. No primary bone lesion or focal pathologic process. Small sclerotic focus in the T1 vertebral body is stable and compatible with bone island. Soft tissues and spinal canal: No prevertebral fluid or swelling. No visible canal hematoma. Disc levels: Loss of disc height with degenerative change seen at C3-4 C5-6 and C7-T1. Facet osteoarthritis noted  bilaterally. Upper chest: Biapical pleuroparenchymal scarring. Other: None. IMPRESSION: 1. No evidence for an acute cervical spine fracture. 2. Multilevel degenerative disc and facet disease. Electronically Signed   By: Misty Stanley M.D.   On: 01/13/2019 12:42   Dg Pelvis Portable  Result Date: 01/13/2019 CLINICAL DATA:  85 year old female with fall. EXAM: PORTABLE PELVIS 1-2 VIEWS COMPARISON:  Pelvic radiograph dated 05/28/2017. FINDINGS: There is no acute fracture or dislocation. Old right femoral fracture status post internal fixation. The visualized portion of the hardware appears intact. The bones are osteopenic. There is arthritic changes of the right hip with joint space narrowing. Degenerative changes of the lower lumbar spine. The soft tissues are unremarkable. IMPRESSION: No acute fracture or dislocation. Electronically Signed   By: Anner Crete M.D.   On: 01/13/2019 13:02   Dg Chest Portable 1 View  Result Date:  01/13/2019 CLINICAL DATA:  Unwitnessed fall, history of hypertension EXAM: PORTABLE CHEST 1 VIEW COMPARISON:  11/18/2016 FINDINGS: Cardiomediastinal contours are stable with calcified atherosclerotic changes in the thoracic aorta. Basilar scarring noted in the left chest. No signs of consolidation or pleural effusion. Osteopenia and spinal degenerative changes with levo convexity at the thoracolumbar junction. Findings are similar to prior study. IMPRESSION: 1. No acute cardiopulmonary disease.  And 2. Stable calcified atherosclerotic changes in the thoracic aorta. 3. Stable spinal degenerative changes. Electronically Signed   By: Zetta Bills M.D.   On: 01/13/2019 13:00    ____________________________________________   PROCEDURES  Procedure(s) performed (including Critical Care):  Procedures   ____________________________________________   INITIAL IMPRESSION / ASSESSMENT AND PLAN / ED COURSE   Carrie Calhoun was evaluated in Emergency Department on 01/13/2019 for the symptoms described in the history of present illness. She was evaluated in the context of the global COVID-19 pandemic, which necessitated consideration that the patient might be at risk for infection with the SARS-CoV-2 virus that causes COVID-19. Institutional protocols and algorithms that pertain to the evaluation of patients at risk for COVID-19 are in a state of rapid change based on information released by regulatory bodies including the CDC and federal and state organizations. These policies and algorithms were followed during the patient's care in the ED.    We will get CT head to evaluate for epidural subdural hematoma.  CT cervical evaluate for cervical fracture.  Patient EKG does show sinus bradycardia but this is at baseline for patient.  No chest pain to suggest this is ACS or shortness of breath to suggest PE.  Will get x-rays to evaluate for fractures although patient does not seem to be tender anywhere  on her extremities.  I discussed with patient's daughter about additional work-up.  Daughter says that she not had any urinary symptoms and was recently tested for UTI so would like to hold off on sample of urine.    Labs show a hemoglobin of 9.2 down from 11/1 year ago.  Rectal exam without evidence of melena.  Hemoccult negative.  Discussed with family following this up with her primary care doctor.  Given elevated MCV most likely secondary to folate or B12 deficiency.  Her kidney function was also slightly elevated to.  Daughter says that she does not eat and drink very well.  This is most likely been slowly elevating given her prior was 1.4.  Offered to give an IV and start some fluids but given its more chronic in nature family elected to hold off.  Patient was able to ambulate with a walker.  We  will discharge her back to facility.     ____________________________________________   FINAL CLINICAL IMPRESSION(S) / ED DIAGNOSES   Final diagnoses:  Fall, initial encounter      MEDICATIONS GIVEN DURING THIS VISIT:  Medications  acetaminophen (TYLENOL) tablet 1,000 mg (1,000 mg Oral Given 01/13/19 1251)     ED Discharge Orders    None       Note:  This document was prepared using Dragon voice recognition software and may include unintentional dictation errors.   Concha Se, MD 01/13/19 1359

## 2019-01-13 NOTE — ED Notes (Signed)
Pt taken to scans  

## 2019-01-13 NOTE — ED Notes (Signed)
Pt returned from scans at this time

## 2019-01-13 NOTE — ED Notes (Signed)
Family member at bedside.

## 2019-01-14 DIAGNOSIS — R413 Other amnesia: Secondary | ICD-10-CM | POA: Diagnosis not present

## 2019-01-15 DIAGNOSIS — I69354 Hemiplegia and hemiparesis following cerebral infarction affecting left non-dominant side: Secondary | ICD-10-CM | POA: Diagnosis not present

## 2019-01-15 DIAGNOSIS — G5603 Carpal tunnel syndrome, bilateral upper limbs: Secondary | ICD-10-CM | POA: Diagnosis not present

## 2019-01-15 DIAGNOSIS — D631 Anemia in chronic kidney disease: Secondary | ICD-10-CM | POA: Diagnosis not present

## 2019-01-15 DIAGNOSIS — I129 Hypertensive chronic kidney disease with stage 1 through stage 4 chronic kidney disease, or unspecified chronic kidney disease: Secondary | ICD-10-CM | POA: Diagnosis not present

## 2019-01-15 DIAGNOSIS — I440 Atrioventricular block, first degree: Secondary | ICD-10-CM | POA: Diagnosis not present

## 2019-01-15 DIAGNOSIS — N183 Chronic kidney disease, stage 3 unspecified: Secondary | ICD-10-CM | POA: Diagnosis not present

## 2019-01-15 DIAGNOSIS — G2581 Restless legs syndrome: Secondary | ICD-10-CM | POA: Diagnosis not present

## 2019-01-15 DIAGNOSIS — F0280 Dementia in other diseases classified elsewhere without behavioral disturbance: Secondary | ICD-10-CM | POA: Diagnosis not present

## 2019-01-15 DIAGNOSIS — G309 Alzheimer's disease, unspecified: Secondary | ICD-10-CM | POA: Diagnosis not present

## 2019-01-20 DIAGNOSIS — G2581 Restless legs syndrome: Secondary | ICD-10-CM | POA: Diagnosis not present

## 2019-01-20 DIAGNOSIS — G5603 Carpal tunnel syndrome, bilateral upper limbs: Secondary | ICD-10-CM | POA: Diagnosis not present

## 2019-01-20 DIAGNOSIS — D631 Anemia in chronic kidney disease: Secondary | ICD-10-CM | POA: Diagnosis not present

## 2019-01-20 DIAGNOSIS — I440 Atrioventricular block, first degree: Secondary | ICD-10-CM | POA: Diagnosis not present

## 2019-01-20 DIAGNOSIS — N183 Chronic kidney disease, stage 3 unspecified: Secondary | ICD-10-CM | POA: Diagnosis not present

## 2019-01-20 DIAGNOSIS — I129 Hypertensive chronic kidney disease with stage 1 through stage 4 chronic kidney disease, or unspecified chronic kidney disease: Secondary | ICD-10-CM | POA: Diagnosis not present

## 2019-01-20 DIAGNOSIS — G309 Alzheimer's disease, unspecified: Secondary | ICD-10-CM | POA: Diagnosis not present

## 2019-01-20 DIAGNOSIS — F0280 Dementia in other diseases classified elsewhere without behavioral disturbance: Secondary | ICD-10-CM | POA: Diagnosis not present

## 2019-01-20 DIAGNOSIS — I69354 Hemiplegia and hemiparesis following cerebral infarction affecting left non-dominant side: Secondary | ICD-10-CM | POA: Diagnosis not present

## 2019-01-21 DIAGNOSIS — G2581 Restless legs syndrome: Secondary | ICD-10-CM | POA: Diagnosis not present

## 2019-01-21 DIAGNOSIS — Z20828 Contact with and (suspected) exposure to other viral communicable diseases: Secondary | ICD-10-CM | POA: Diagnosis not present

## 2019-01-21 DIAGNOSIS — I69354 Hemiplegia and hemiparesis following cerebral infarction affecting left non-dominant side: Secondary | ICD-10-CM | POA: Diagnosis not present

## 2019-01-21 DIAGNOSIS — G309 Alzheimer's disease, unspecified: Secondary | ICD-10-CM | POA: Diagnosis not present

## 2019-01-21 DIAGNOSIS — I129 Hypertensive chronic kidney disease with stage 1 through stage 4 chronic kidney disease, or unspecified chronic kidney disease: Secondary | ICD-10-CM | POA: Diagnosis not present

## 2019-01-21 DIAGNOSIS — D631 Anemia in chronic kidney disease: Secondary | ICD-10-CM | POA: Diagnosis not present

## 2019-01-21 DIAGNOSIS — N183 Chronic kidney disease, stage 3 unspecified: Secondary | ICD-10-CM | POA: Diagnosis not present

## 2019-01-21 DIAGNOSIS — G5603 Carpal tunnel syndrome, bilateral upper limbs: Secondary | ICD-10-CM | POA: Diagnosis not present

## 2019-01-21 DIAGNOSIS — F0280 Dementia in other diseases classified elsewhere without behavioral disturbance: Secondary | ICD-10-CM | POA: Diagnosis not present

## 2019-01-21 DIAGNOSIS — I440 Atrioventricular block, first degree: Secondary | ICD-10-CM | POA: Diagnosis not present

## 2019-01-22 DIAGNOSIS — I69354 Hemiplegia and hemiparesis following cerebral infarction affecting left non-dominant side: Secondary | ICD-10-CM | POA: Diagnosis not present

## 2019-01-22 DIAGNOSIS — D631 Anemia in chronic kidney disease: Secondary | ICD-10-CM | POA: Diagnosis not present

## 2019-01-22 DIAGNOSIS — G2581 Restless legs syndrome: Secondary | ICD-10-CM | POA: Diagnosis not present

## 2019-01-22 DIAGNOSIS — F0280 Dementia in other diseases classified elsewhere without behavioral disturbance: Secondary | ICD-10-CM | POA: Diagnosis not present

## 2019-01-22 DIAGNOSIS — G309 Alzheimer's disease, unspecified: Secondary | ICD-10-CM | POA: Diagnosis not present

## 2019-01-22 DIAGNOSIS — I129 Hypertensive chronic kidney disease with stage 1 through stage 4 chronic kidney disease, or unspecified chronic kidney disease: Secondary | ICD-10-CM | POA: Diagnosis not present

## 2019-01-22 DIAGNOSIS — N183 Chronic kidney disease, stage 3 unspecified: Secondary | ICD-10-CM | POA: Diagnosis not present

## 2019-01-22 DIAGNOSIS — I440 Atrioventricular block, first degree: Secondary | ICD-10-CM | POA: Diagnosis not present

## 2019-01-26 DIAGNOSIS — I129 Hypertensive chronic kidney disease with stage 1 through stage 4 chronic kidney disease, or unspecified chronic kidney disease: Secondary | ICD-10-CM | POA: Diagnosis not present

## 2019-01-26 DIAGNOSIS — F0280 Dementia in other diseases classified elsewhere without behavioral disturbance: Secondary | ICD-10-CM | POA: Diagnosis not present

## 2019-01-26 DIAGNOSIS — D631 Anemia in chronic kidney disease: Secondary | ICD-10-CM | POA: Diagnosis not present

## 2019-01-26 DIAGNOSIS — N183 Chronic kidney disease, stage 3 unspecified: Secondary | ICD-10-CM | POA: Diagnosis not present

## 2019-01-26 DIAGNOSIS — G2581 Restless legs syndrome: Secondary | ICD-10-CM | POA: Diagnosis not present

## 2019-01-26 DIAGNOSIS — I440 Atrioventricular block, first degree: Secondary | ICD-10-CM | POA: Diagnosis not present

## 2019-01-26 DIAGNOSIS — I69354 Hemiplegia and hemiparesis following cerebral infarction affecting left non-dominant side: Secondary | ICD-10-CM | POA: Diagnosis not present

## 2019-01-26 DIAGNOSIS — G5603 Carpal tunnel syndrome, bilateral upper limbs: Secondary | ICD-10-CM | POA: Diagnosis not present

## 2019-01-26 DIAGNOSIS — G309 Alzheimer's disease, unspecified: Secondary | ICD-10-CM | POA: Diagnosis not present

## 2019-01-27 DIAGNOSIS — Z20828 Contact with and (suspected) exposure to other viral communicable diseases: Secondary | ICD-10-CM | POA: Diagnosis not present

## 2019-01-29 DIAGNOSIS — I440 Atrioventricular block, first degree: Secondary | ICD-10-CM | POA: Diagnosis not present

## 2019-01-29 DIAGNOSIS — G309 Alzheimer's disease, unspecified: Secondary | ICD-10-CM | POA: Diagnosis not present

## 2019-01-29 DIAGNOSIS — G2581 Restless legs syndrome: Secondary | ICD-10-CM | POA: Diagnosis not present

## 2019-01-29 DIAGNOSIS — I129 Hypertensive chronic kidney disease with stage 1 through stage 4 chronic kidney disease, or unspecified chronic kidney disease: Secondary | ICD-10-CM | POA: Diagnosis not present

## 2019-01-29 DIAGNOSIS — F0280 Dementia in other diseases classified elsewhere without behavioral disturbance: Secondary | ICD-10-CM | POA: Diagnosis not present

## 2019-01-29 DIAGNOSIS — I69354 Hemiplegia and hemiparesis following cerebral infarction affecting left non-dominant side: Secondary | ICD-10-CM | POA: Diagnosis not present

## 2019-01-29 DIAGNOSIS — G5603 Carpal tunnel syndrome, bilateral upper limbs: Secondary | ICD-10-CM | POA: Diagnosis not present

## 2019-01-29 DIAGNOSIS — D631 Anemia in chronic kidney disease: Secondary | ICD-10-CM | POA: Diagnosis not present

## 2019-01-29 DIAGNOSIS — N183 Chronic kidney disease, stage 3 unspecified: Secondary | ICD-10-CM | POA: Diagnosis not present

## 2019-02-02 DIAGNOSIS — G309 Alzheimer's disease, unspecified: Secondary | ICD-10-CM | POA: Diagnosis not present

## 2019-02-02 DIAGNOSIS — I129 Hypertensive chronic kidney disease with stage 1 through stage 4 chronic kidney disease, or unspecified chronic kidney disease: Secondary | ICD-10-CM | POA: Diagnosis not present

## 2019-02-02 DIAGNOSIS — G2581 Restless legs syndrome: Secondary | ICD-10-CM | POA: Diagnosis not present

## 2019-02-02 DIAGNOSIS — I440 Atrioventricular block, first degree: Secondary | ICD-10-CM | POA: Diagnosis not present

## 2019-02-02 DIAGNOSIS — F0280 Dementia in other diseases classified elsewhere without behavioral disturbance: Secondary | ICD-10-CM | POA: Diagnosis not present

## 2019-02-02 DIAGNOSIS — I69354 Hemiplegia and hemiparesis following cerebral infarction affecting left non-dominant side: Secondary | ICD-10-CM | POA: Diagnosis not present

## 2019-02-02 DIAGNOSIS — G5603 Carpal tunnel syndrome, bilateral upper limbs: Secondary | ICD-10-CM | POA: Diagnosis not present

## 2019-02-02 DIAGNOSIS — D631 Anemia in chronic kidney disease: Secondary | ICD-10-CM | POA: Diagnosis not present

## 2019-02-02 DIAGNOSIS — N183 Chronic kidney disease, stage 3 unspecified: Secondary | ICD-10-CM | POA: Diagnosis not present

## 2019-02-09 DIAGNOSIS — N183 Chronic kidney disease, stage 3 unspecified: Secondary | ICD-10-CM | POA: Diagnosis not present

## 2019-02-09 DIAGNOSIS — F0280 Dementia in other diseases classified elsewhere without behavioral disturbance: Secondary | ICD-10-CM | POA: Diagnosis not present

## 2019-02-09 DIAGNOSIS — G2581 Restless legs syndrome: Secondary | ICD-10-CM | POA: Diagnosis not present

## 2019-02-09 DIAGNOSIS — D631 Anemia in chronic kidney disease: Secondary | ICD-10-CM | POA: Diagnosis not present

## 2019-02-09 DIAGNOSIS — I129 Hypertensive chronic kidney disease with stage 1 through stage 4 chronic kidney disease, or unspecified chronic kidney disease: Secondary | ICD-10-CM | POA: Diagnosis not present

## 2019-02-09 DIAGNOSIS — I440 Atrioventricular block, first degree: Secondary | ICD-10-CM | POA: Diagnosis not present

## 2019-02-09 DIAGNOSIS — I69354 Hemiplegia and hemiparesis following cerebral infarction affecting left non-dominant side: Secondary | ICD-10-CM | POA: Diagnosis not present

## 2019-02-09 DIAGNOSIS — G5603 Carpal tunnel syndrome, bilateral upper limbs: Secondary | ICD-10-CM | POA: Diagnosis not present

## 2019-02-09 DIAGNOSIS — G309 Alzheimer's disease, unspecified: Secondary | ICD-10-CM | POA: Diagnosis not present

## 2019-02-10 DIAGNOSIS — Z20828 Contact with and (suspected) exposure to other viral communicable diseases: Secondary | ICD-10-CM | POA: Diagnosis not present

## 2019-02-12 DIAGNOSIS — I10 Essential (primary) hypertension: Secondary | ICD-10-CM | POA: Diagnosis not present

## 2019-02-12 DIAGNOSIS — E034 Atrophy of thyroid (acquired): Secondary | ICD-10-CM | POA: Diagnosis not present

## 2019-02-12 DIAGNOSIS — D649 Anemia, unspecified: Secondary | ICD-10-CM | POA: Diagnosis not present

## 2019-02-16 DIAGNOSIS — N183 Chronic kidney disease, stage 3 unspecified: Secondary | ICD-10-CM | POA: Diagnosis not present

## 2019-02-16 DIAGNOSIS — I69354 Hemiplegia and hemiparesis following cerebral infarction affecting left non-dominant side: Secondary | ICD-10-CM | POA: Diagnosis not present

## 2019-02-16 DIAGNOSIS — I440 Atrioventricular block, first degree: Secondary | ICD-10-CM | POA: Diagnosis not present

## 2019-02-16 DIAGNOSIS — G309 Alzheimer's disease, unspecified: Secondary | ICD-10-CM | POA: Diagnosis not present

## 2019-02-16 DIAGNOSIS — F0280 Dementia in other diseases classified elsewhere without behavioral disturbance: Secondary | ICD-10-CM | POA: Diagnosis not present

## 2019-02-16 DIAGNOSIS — I129 Hypertensive chronic kidney disease with stage 1 through stage 4 chronic kidney disease, or unspecified chronic kidney disease: Secondary | ICD-10-CM | POA: Diagnosis not present

## 2019-02-16 DIAGNOSIS — G2581 Restless legs syndrome: Secondary | ICD-10-CM | POA: Diagnosis not present

## 2019-02-16 DIAGNOSIS — D631 Anemia in chronic kidney disease: Secondary | ICD-10-CM | POA: Diagnosis not present

## 2019-02-16 DIAGNOSIS — G5603 Carpal tunnel syndrome, bilateral upper limbs: Secondary | ICD-10-CM | POA: Diagnosis not present

## 2019-02-18 DIAGNOSIS — Z20828 Contact with and (suspected) exposure to other viral communicable diseases: Secondary | ICD-10-CM | POA: Diagnosis not present

## 2019-03-04 DIAGNOSIS — Z20828 Contact with and (suspected) exposure to other viral communicable diseases: Secondary | ICD-10-CM | POA: Diagnosis not present

## 2019-03-25 ENCOUNTER — Non-Acute Institutional Stay: Payer: Medicare HMO | Admitting: Adult Health Nurse Practitioner

## 2019-03-25 ENCOUNTER — Other Ambulatory Visit: Payer: Self-pay

## 2019-03-25 DIAGNOSIS — F0390 Unspecified dementia without behavioral disturbance: Secondary | ICD-10-CM

## 2019-03-25 DIAGNOSIS — Z20828 Contact with and (suspected) exposure to other viral communicable diseases: Secondary | ICD-10-CM | POA: Diagnosis not present

## 2019-03-25 DIAGNOSIS — Z515 Encounter for palliative care: Secondary | ICD-10-CM | POA: Diagnosis not present

## 2019-03-25 NOTE — Progress Notes (Signed)
[facility] Consult Note Telephone: [phone]  Fax: [phone]  PATIENT NAME: Carrie Calhoun DOB: [birth year] MRN: [id number]  PRIMARY CARE PROVIDER:   Adin Hector, MD  REFERRING PROVIDER:  Adin Hector, MD [address],  [place] [id number]  RESPONSIBLE PARTY:   Tyrone Sage, daughter [phone]    RECOMMENDATIONS and PLAN:  1.  Advanced care planning.  Patient is a DNR  2.  Dementia.  FAST 6c.  Patient is able to ambulate with walker.  Requires assistance with ADLs. Is incontinent of bladder. At baseline is A&O to person and place.  Staff does report some increased confusion.  States that she has good days and bad days but has been having more bad days.  Has had a recent weight loss from 115 to 105.  Has been getting help with feeding and taking nutritional supplement, Ensure and has gained back to 109.  Continue assistance with feeding and nutritional supplement, and offer more finger foods. Patient has day/night sleep disturbance.  Hesitant to add anything as staff is having difficulty getting her to take her meds, see below.  May add melatonin in the future for sleep disturbance if able to get her to take her meds.  Continue supportive care.  3.  Medication management.  Staff reports that patient does not want to take her meds.  They have crushed her meds and put in applesauce and she has started refusing that as well.  Staff reported that all of her medications had been stopped due to her refusal to take her meds.  Spoke with daughter and she stated that she did not authorize for her meds to be stopped and wanted to at least continue the levothyroxine, celexa, aricept, and namenda.  Reached out to Dr. Olin Pia office for clarification and spoke with Judeen Hammans who is going to let Dr. Caryl Comes know of daughter's request.    Patient is having progression of her dementia.  Will be hard to manage  behaviors as she is not taking medications consistently. She did have a fall 03/15/2019 and was evaluated at the ER with no acute injury noted. Palliative will monitor monthly and as needed for symptom management/decline and make recommendations as needed.  I spent 45 minutes providing this consultation, including time with patient/family, chart review, provider coordination, and documentation  . More than 50% of the time in this consultation was spent coordinating communication.   HISTORY OF PRESENT ILLNESS:  Carrie Calhoun is a 86 y.o. year old female with multiple medical problems including dementia, HTN, hypothyroidism, CKD stage 3, anemia, OSA. Palliative Care was asked to help address goals of care.   CODE STATUS: DNR  PPS: 50% HOSPICE ELIGIBILITY/DIAGNOSIS: TBD  PHYSICAL EXAM:  BP 128/70  HR  74  Temp 97.5   Weight   01/05/2019 115                03/17/2019 105                03/25/2019  109 General: NAD, frail appearing, thin Cardiovascular: regular rate and rhythm Pulmonary: lung sounds clear; normal respiratory effort Abdomen: soft, nontender, + bowel sounds GU: no suprapubic tenderness Extremities: trace edema to bilateral feet and ankles, no joint deformities Skin: no rashes Neurological: Weakness;  A&O to person and place   PAST MEDICAL HISTORY:  Past Medical History:  Diagnosis Date  . Anemia, unspecified   . Anxiety   .  Atrioventricular block 06/09/2008   1st degree with bradycardia  . Cardiac arrest (HCC)   . Chicken pox   . CKD (chronic kidney disease) stage 3, GFR 30-59 ml/min (HCC)   . Diverticulosis   . History of cardiac arrest    during prior cataract surgery, thought to be from anesthesia, per pt. Followed by Dr. Darrold Junker  . Hyperlipidemia, unspecified   . Hypertension   . Hypothyroidism, unspecified   . Osteopenia   . Renal disorder     SOCIAL HX:  Social History   Tobacco Use  . Smoking status: Never Smoker  . Smokeless tobacco: Never  Used  Substance Use Topics  . Alcohol use: No    ALLERGIES:  Allergies  Allergen Reactions  . Alendronate     unkown  . Lidocaine     Cardiac Arrest  . Phenylephrine   . Phenyltoloxamine Other (See Comments)  . Azithromycin Rash     PERTINENT MEDICATIONS:  Outpatient Encounter Medications as of 03/25/2019  Medication Sig  . acetaminophen (TYLENOL) 325 MG tablet Take 2 tablets (650 mg total) by mouth every 6 (six) hours as needed for mild pain (or Fever >/= 101).  Marland Kitchen acetaminophen (TYLENOL) 325 MG tablet Take 650 mg by mouth 4 (four) times daily.  Marland Kitchen aspirin EC 81 MG tablet Take 1 tablet by mouth daily.  . busPIRone (BUSPAR) 15 MG tablet Take 1 tablet by mouth daily.   . Calcium Carbonate-Vitamin D 600-400 MG-UNIT tablet Take 1 tablet by mouth daily.   . citalopram (CELEXA) 10 MG tablet Take 1 tablet (10 mg total) by mouth every other day.  . docusate sodium (COLACE) 100 MG capsule Take 1 capsule (100 mg total) by mouth 2 (two) times daily. (Patient not taking: Reported on 11/18/2016)  . donepezil (ARICEPT) 5 MG tablet Take 1 tablet by mouth daily.   Marland Kitchen levothyroxine (SYNTHROID, LEVOTHROID) 75 MCG tablet Take 1 tablet by mouth daily before breakfast.   . Lifitegrast 5 % SOLN Place 1 drop into both eyes 2 (two) times daily.   . methocarbamol (ROBAXIN) 500 MG tablet Take 1 tablet (500 mg total) by mouth every 6 (six) hours as needed for muscle spasms. (Patient not taking: Reported on 11/18/2016)  . mirabegron ER (MYRBETRIQ) 25 MG TB24 tablet Take 1 tablet (25 mg total) by mouth daily. (Patient not taking: Reported on 11/18/2016)  . Multiple Vitamin (MULTIVITAMIN) tablet Take 1 tablet by mouth daily.  . Omega-3 Fatty Acids (FISH OIL PO) Take 1 tablet by mouth daily.  . traMADol (ULTRAM) 50 MG tablet Take 1 tablet (50 mg total) by mouth every 4 (four) hours as needed. (Patient not taking: Reported on 11/18/2016)  . traMADol (ULTRAM) 50 MG tablet Take 1 tablet (50 mg total) by mouth 4 (four)  times daily. Scheduled for pain. Ok to give prn doses in addition to scheduled for severe pain (Patient not taking: Reported on 11/18/2016)   No facility-administered encounter medications on file as of 03/25/2019.     Jolisa Intriago Marlena Clipper, NP

## 2019-03-26 ENCOUNTER — Telehealth: Payer: Self-pay | Admitting: Adult Health Nurse Practitioner

## 2019-03-26 DIAGNOSIS — Z20828 Contact with and (suspected) exposure to other viral communicable diseases: Secondary | ICD-10-CM | POA: Diagnosis not present

## 2019-03-26 NOTE — Telephone Encounter (Signed)
Updated daughter that received VM from Dr. Odessa Fleming office that he will reinstate her maintenance medications.  She is wanting me to come during a med pass just see if there is anything I am seeing or anything that I can do to help her take her meds.  Did tell her that I would reach out to facility and set up a time to be with her during a med pass.  Tamela Elsayed K. Garner Nash NP  Was able to talk with Kendal Hymen at facility and set up Wednesday 1/27 at 7:30 to be there during her med pass.

## 2019-03-31 ENCOUNTER — Non-Acute Institutional Stay: Payer: Medicare HMO | Admitting: Adult Health Nurse Practitioner

## 2019-03-31 ENCOUNTER — Other Ambulatory Visit: Payer: Self-pay

## 2019-03-31 DIAGNOSIS — F0390 Unspecified dementia without behavioral disturbance: Secondary | ICD-10-CM

## 2019-03-31 DIAGNOSIS — Z515 Encounter for palliative care: Secondary | ICD-10-CM

## 2019-03-31 NOTE — Progress Notes (Signed)
[facility] Consult Note Telephone: [phone]  Fax: [phone]  PATIENT NAME: Carrie Calhoun DOB: 17-Oct-1930 MRN: [id number]  PRIMARY CARE PROVIDER:   Adin Hector, MD  REFERRING PROVIDER:  Adin Hector, MD [address] [place],  [place] [address]  RESPONSIBLE PARTY:   Tyrone Sage, daughter [phone]     RECOMMENDATIONS and PLAN:  1.  Advanced care planning.  Patient is a DNR  2.  Dementia. FAST 6c.  Patient is able to ambulate with walker.  Requires assistance with ADLs. Is incontinent of bladder. At baseline is A&O to person and place. Today patient is standing in bathroom getting herself ready for the day.  She is fiddling with a hairbrush in a drawer.  She does not talk to provider.  She did point at something at one point and asked provider what it was.  Her medications are still discontinued due to patient refusing them.  Was concerned that with discontinuing the celexa, namenda, and aricept she may have more behavioral issues.  Staff states that she has not had any increased anxiety or agitation.  Patient has been seen by [facility] and staff has tried crushing her meds in applesauce, pudding, ice cream with no success in getting her to take her meds.  Did discuss with daughter the reasons for discontinuing the meds and the measures taken at facility to get her to take them.  Daughter is still upset and voices that she is going to contact PCP office about it.  Discussed dementia progression with daughter.  She would like me to continue trying to develop a rapport with her mother and to continue trying to get her to take her meds.  Spoke with Judeen Hammans at Dr. Olin Pia office and expressed that daughter does want to continue with maintenance medications.  She is faxing the most recent medication list to [facility].  Asked her to let me know if Dr. Caryl Comes wanted me to reorder those meds.   Palliative  will monitor monthly and as needed for symptom management/decline and make recommendations as needed.   I spent 30 minutes providing this consultation, including time with patient/family, chart review, provider coordination, and documentation. More than 50% of the time in this consultation was spent coordinating communication.   HISTORY OF PRESENT ILLNESS:  Carrie Calhoun is a 86 y.o. year old female with multiple medical problems including dementia, HTN, hypothyroidism, CKD stage 3, anemia, OSA. Palliative Care was asked to help address goals of care.   CODE STATUS: DNR  PPS: 50% HOSPICE ELIGIBILITY/DIAGNOSIS: TBD  PHYSICAL EXAM:   General: NAD, frail appearing, thin Cardiovascular: regular rate and rhythm Pulmonary: clear ant fields Extremities: no edema, no joint deformities Skin: no rashes on exposed skin Neurological: Weakness; A&O to person and place   PAST MEDICAL HISTORY:  Past Medical History:  Diagnosis Date  . Anemia, unspecified   . Anxiety   . Atrioventricular block 06/09/2008   1st degree with bradycardia  . Cardiac arrest ([facility])   . Chicken pox   . CKD (chronic kidney disease) stage 3, GFR 30-59 ml/min ([facility])   . Diverticulosis   . History of cardiac arrest    during prior cataract surgery, thought to be from anesthesia, per pt. Followed by Dr. Saralyn Pilar  . Hyperlipidemia, unspecified   . Hypertension   . Hypothyroidism, unspecified   . Osteopenia   . Renal disorder     SOCIAL HX:  Social  History   Tobacco Use  . Smoking status: Never Smoker  . Smokeless tobacco: Never Used  Substance Use Topics  . Alcohol use: No    ALLERGIES:  Allergies  Allergen Reactions  . Alendronate     unkown  . Lidocaine     Cardiac Arrest  . Phenylephrine   . Phenyltoloxamine Other (See Comments)  . Azithromycin Rash     PERTINENT MEDICATIONS:  Outpatient Encounter Medications as of 03/31/2019  Medication Sig  . acetaminophen (TYLENOL) 325 MG tablet Take  2 tablets (650 mg total) by mouth every 6 (six) hours as needed for mild pain (or Fever >/= 101).  Marland Kitchen acetaminophen (TYLENOL) 325 MG tablet Take 650 mg by mouth 4 (four) times daily.  Marland Kitchen aspirin EC 81 MG tablet Take 1 tablet by mouth daily.  . busPIRone (BUSPAR) 15 MG tablet Take 1 tablet by mouth daily.   . Calcium Carbonate-Vitamin D 600-400 MG-UNIT tablet Take 1 tablet by mouth daily.   . citalopram (CELEXA) 10 MG tablet Take 1 tablet (10 mg total) by mouth every other day.  . docusate sodium (COLACE) 100 MG capsule Take 1 capsule (100 mg total) by mouth 2 (two) times daily. (Patient not taking: Reported on 11/18/2016)  . donepezil (ARICEPT) 5 MG tablet Take 1 tablet by mouth daily.   Marland Kitchen levothyroxine (SYNTHROID, LEVOTHROID) 75 MCG tablet Take 1 tablet by mouth daily before breakfast.   . Lifitegrast 5 % SOLN Place 1 drop into both eyes 2 (two) times daily.   . methocarbamol (ROBAXIN) 500 MG tablet Take 1 tablet (500 mg total) by mouth every 6 (six) hours as needed for muscle spasms. (Patient not taking: Reported on 11/18/2016)  . mirabegron ER (MYRBETRIQ) 25 MG TB24 tablet Take 1 tablet (25 mg total) by mouth daily. (Patient not taking: Reported on 11/18/2016)  . Multiple Vitamin (MULTIVITAMIN) tablet Take 1 tablet by mouth daily.  . Omega-3 Fatty Acids (FISH OIL PO) Take 1 tablet by mouth daily.  . traMADol (ULTRAM) 50 MG tablet Take 1 tablet (50 mg total) by mouth every 4 (four) hours as needed. (Patient not taking: Reported on 11/18/2016)  . traMADol (ULTRAM) 50 MG tablet Take 1 tablet (50 mg total) by mouth 4 (four) times daily. Scheduled for pain. Ok to give prn doses in addition to scheduled for severe pain (Patient not taking: Reported on 11/18/2016)   No facility-administered encounter medications on file as of 03/31/2019.     Carrie Peugh Marlena Clipper, NP

## 2019-04-01 DIAGNOSIS — Z20828 Contact with and (suspected) exposure to other viral communicable diseases: Secondary | ICD-10-CM | POA: Diagnosis not present

## 2019-04-08 DIAGNOSIS — Z20828 Contact with and (suspected) exposure to other viral communicable diseases: Secondary | ICD-10-CM | POA: Diagnosis not present

## 2019-04-15 DIAGNOSIS — Z20828 Contact with and (suspected) exposure to other viral communicable diseases: Secondary | ICD-10-CM | POA: Diagnosis not present

## 2019-04-23 DIAGNOSIS — Z20828 Contact with and (suspected) exposure to other viral communicable diseases: Secondary | ICD-10-CM | POA: Diagnosis not present

## 2019-04-24 DIAGNOSIS — Z20828 Contact with and (suspected) exposure to other viral communicable diseases: Secondary | ICD-10-CM | POA: Diagnosis not present

## 2019-04-28 DIAGNOSIS — D631 Anemia in chronic kidney disease: Secondary | ICD-10-CM | POA: Diagnosis not present

## 2019-04-28 DIAGNOSIS — M5030 Other cervical disc degeneration, unspecified cervical region: Secondary | ICD-10-CM | POA: Diagnosis not present

## 2019-04-28 DIAGNOSIS — F0390 Unspecified dementia without behavioral disturbance: Secondary | ICD-10-CM | POA: Diagnosis not present

## 2019-04-28 DIAGNOSIS — M8580 Other specified disorders of bone density and structure, unspecified site: Secondary | ICD-10-CM | POA: Diagnosis not present

## 2019-04-28 DIAGNOSIS — N183 Chronic kidney disease, stage 3 unspecified: Secondary | ICD-10-CM | POA: Diagnosis not present

## 2019-04-28 DIAGNOSIS — I951 Orthostatic hypotension: Secondary | ICD-10-CM | POA: Diagnosis not present

## 2019-04-28 DIAGNOSIS — I440 Atrioventricular block, first degree: Secondary | ICD-10-CM | POA: Diagnosis not present

## 2019-04-28 DIAGNOSIS — M5136 Other intervertebral disc degeneration, lumbar region: Secondary | ICD-10-CM | POA: Diagnosis not present

## 2019-04-28 DIAGNOSIS — R296 Repeated falls: Secondary | ICD-10-CM | POA: Diagnosis not present

## 2019-04-28 DIAGNOSIS — Z993 Dependence on wheelchair: Secondary | ICD-10-CM | POA: Diagnosis not present

## 2019-04-28 DIAGNOSIS — M47816 Spondylosis without myelopathy or radiculopathy, lumbar region: Secondary | ICD-10-CM | POA: Diagnosis not present

## 2019-04-28 DIAGNOSIS — I129 Hypertensive chronic kidney disease with stage 1 through stage 4 chronic kidney disease, or unspecified chronic kidney disease: Secondary | ICD-10-CM | POA: Diagnosis not present

## 2019-04-29 DIAGNOSIS — Z20828 Contact with and (suspected) exposure to other viral communicable diseases: Secondary | ICD-10-CM | POA: Diagnosis not present

## 2019-04-30 DIAGNOSIS — Z20828 Contact with and (suspected) exposure to other viral communicable diseases: Secondary | ICD-10-CM | POA: Diagnosis not present

## 2019-05-03 DIAGNOSIS — M5030 Other cervical disc degeneration, unspecified cervical region: Secondary | ICD-10-CM | POA: Diagnosis not present

## 2019-05-03 DIAGNOSIS — I440 Atrioventricular block, first degree: Secondary | ICD-10-CM | POA: Diagnosis not present

## 2019-05-03 DIAGNOSIS — D631 Anemia in chronic kidney disease: Secondary | ICD-10-CM | POA: Diagnosis not present

## 2019-05-03 DIAGNOSIS — M8580 Other specified disorders of bone density and structure, unspecified site: Secondary | ICD-10-CM | POA: Diagnosis not present

## 2019-05-03 DIAGNOSIS — M47816 Spondylosis without myelopathy or radiculopathy, lumbar region: Secondary | ICD-10-CM | POA: Diagnosis not present

## 2019-05-03 DIAGNOSIS — I129 Hypertensive chronic kidney disease with stage 1 through stage 4 chronic kidney disease, or unspecified chronic kidney disease: Secondary | ICD-10-CM | POA: Diagnosis not present

## 2019-05-03 DIAGNOSIS — N183 Chronic kidney disease, stage 3 unspecified: Secondary | ICD-10-CM | POA: Diagnosis not present

## 2019-05-03 DIAGNOSIS — I951 Orthostatic hypotension: Secondary | ICD-10-CM | POA: Diagnosis not present

## 2019-05-03 DIAGNOSIS — M5136 Other intervertebral disc degeneration, lumbar region: Secondary | ICD-10-CM | POA: Diagnosis not present

## 2019-05-12 DIAGNOSIS — M47816 Spondylosis without myelopathy or radiculopathy, lumbar region: Secondary | ICD-10-CM | POA: Diagnosis not present

## 2019-05-12 DIAGNOSIS — M5030 Other cervical disc degeneration, unspecified cervical region: Secondary | ICD-10-CM | POA: Diagnosis not present

## 2019-05-12 DIAGNOSIS — I129 Hypertensive chronic kidney disease with stage 1 through stage 4 chronic kidney disease, or unspecified chronic kidney disease: Secondary | ICD-10-CM | POA: Diagnosis not present

## 2019-05-12 DIAGNOSIS — N183 Chronic kidney disease, stage 3 unspecified: Secondary | ICD-10-CM | POA: Diagnosis not present

## 2019-05-12 DIAGNOSIS — D631 Anemia in chronic kidney disease: Secondary | ICD-10-CM | POA: Diagnosis not present

## 2019-05-12 DIAGNOSIS — I951 Orthostatic hypotension: Secondary | ICD-10-CM | POA: Diagnosis not present

## 2019-05-12 DIAGNOSIS — M8580 Other specified disorders of bone density and structure, unspecified site: Secondary | ICD-10-CM | POA: Diagnosis not present

## 2019-05-12 DIAGNOSIS — M5136 Other intervertebral disc degeneration, lumbar region: Secondary | ICD-10-CM | POA: Diagnosis not present

## 2019-05-12 DIAGNOSIS — I440 Atrioventricular block, first degree: Secondary | ICD-10-CM | POA: Diagnosis not present

## 2019-05-13 DIAGNOSIS — N183 Chronic kidney disease, stage 3 unspecified: Secondary | ICD-10-CM | POA: Diagnosis not present

## 2019-05-13 DIAGNOSIS — I951 Orthostatic hypotension: Secondary | ICD-10-CM | POA: Diagnosis not present

## 2019-05-13 DIAGNOSIS — D631 Anemia in chronic kidney disease: Secondary | ICD-10-CM | POA: Diagnosis not present

## 2019-05-13 DIAGNOSIS — M8580 Other specified disorders of bone density and structure, unspecified site: Secondary | ICD-10-CM | POA: Diagnosis not present

## 2019-05-13 DIAGNOSIS — M5030 Other cervical disc degeneration, unspecified cervical region: Secondary | ICD-10-CM | POA: Diagnosis not present

## 2019-05-13 DIAGNOSIS — I129 Hypertensive chronic kidney disease with stage 1 through stage 4 chronic kidney disease, or unspecified chronic kidney disease: Secondary | ICD-10-CM | POA: Diagnosis not present

## 2019-05-13 DIAGNOSIS — I440 Atrioventricular block, first degree: Secondary | ICD-10-CM | POA: Diagnosis not present

## 2019-05-13 DIAGNOSIS — M47816 Spondylosis without myelopathy or radiculopathy, lumbar region: Secondary | ICD-10-CM | POA: Diagnosis not present

## 2019-05-13 DIAGNOSIS — M5136 Other intervertebral disc degeneration, lumbar region: Secondary | ICD-10-CM | POA: Diagnosis not present

## 2019-05-27 DIAGNOSIS — M47816 Spondylosis without myelopathy or radiculopathy, lumbar region: Secondary | ICD-10-CM | POA: Diagnosis not present

## 2019-05-27 DIAGNOSIS — N183 Chronic kidney disease, stage 3 unspecified: Secondary | ICD-10-CM | POA: Diagnosis not present

## 2019-05-27 DIAGNOSIS — I129 Hypertensive chronic kidney disease with stage 1 through stage 4 chronic kidney disease, or unspecified chronic kidney disease: Secondary | ICD-10-CM | POA: Diagnosis not present

## 2019-05-27 DIAGNOSIS — M5136 Other intervertebral disc degeneration, lumbar region: Secondary | ICD-10-CM | POA: Diagnosis not present

## 2019-05-27 DIAGNOSIS — I951 Orthostatic hypotension: Secondary | ICD-10-CM | POA: Diagnosis not present

## 2019-06-17 DIAGNOSIS — Z20828 Contact with and (suspected) exposure to other viral communicable diseases: Secondary | ICD-10-CM | POA: Diagnosis not present

## 2019-06-24 DIAGNOSIS — Z20828 Contact with and (suspected) exposure to other viral communicable diseases: Secondary | ICD-10-CM | POA: Diagnosis not present

## 2019-07-01 DIAGNOSIS — Z20828 Contact with and (suspected) exposure to other viral communicable diseases: Secondary | ICD-10-CM | POA: Diagnosis not present

## 2019-07-08 DIAGNOSIS — Z20828 Contact with and (suspected) exposure to other viral communicable diseases: Secondary | ICD-10-CM | POA: Diagnosis not present

## 2019-09-19 DIAGNOSIS — E785 Hyperlipidemia, unspecified: Secondary | ICD-10-CM | POA: Diagnosis not present

## 2019-09-19 DIAGNOSIS — F0150 Vascular dementia without behavioral disturbance: Secondary | ICD-10-CM | POA: Diagnosis not present

## 2019-09-19 DIAGNOSIS — I69818 Other symptoms and signs involving cognitive functions following other cerebrovascular disease: Secondary | ICD-10-CM | POA: Diagnosis not present

## 2019-09-19 DIAGNOSIS — I129 Hypertensive chronic kidney disease with stage 1 through stage 4 chronic kidney disease, or unspecified chronic kidney disease: Secondary | ICD-10-CM | POA: Diagnosis not present

## 2019-11-04 DIAGNOSIS — Z20828 Contact with and (suspected) exposure to other viral communicable diseases: Secondary | ICD-10-CM | POA: Diagnosis not present

## 2019-11-11 DIAGNOSIS — Z20828 Contact with and (suspected) exposure to other viral communicable diseases: Secondary | ICD-10-CM | POA: Diagnosis not present

## 2019-12-08 DIAGNOSIS — E785 Hyperlipidemia, unspecified: Secondary | ICD-10-CM | POA: Diagnosis not present

## 2019-12-08 DIAGNOSIS — F0150 Vascular dementia without behavioral disturbance: Secondary | ICD-10-CM | POA: Diagnosis not present

## 2019-12-08 DIAGNOSIS — I69818 Other symptoms and signs involving cognitive functions following other cerebrovascular disease: Secondary | ICD-10-CM | POA: Diagnosis not present

## 2019-12-08 DIAGNOSIS — N183 Chronic kidney disease, stage 3 unspecified: Secondary | ICD-10-CM | POA: Diagnosis not present

## 2019-12-08 DIAGNOSIS — I951 Orthostatic hypotension: Secondary | ICD-10-CM | POA: Diagnosis not present

## 2019-12-08 DIAGNOSIS — I129 Hypertensive chronic kidney disease with stage 1 through stage 4 chronic kidney disease, or unspecified chronic kidney disease: Secondary | ICD-10-CM | POA: Diagnosis not present

## 2019-12-08 DIAGNOSIS — F419 Anxiety disorder, unspecified: Secondary | ICD-10-CM | POA: Diagnosis not present

## 2020-05-23 DIAGNOSIS — I129 Hypertensive chronic kidney disease with stage 1 through stage 4 chronic kidney disease, or unspecified chronic kidney disease: Secondary | ICD-10-CM | POA: Diagnosis not present

## 2020-05-23 DIAGNOSIS — F419 Anxiety disorder, unspecified: Secondary | ICD-10-CM | POA: Diagnosis not present

## 2020-05-23 DIAGNOSIS — I951 Orthostatic hypotension: Secondary | ICD-10-CM | POA: Diagnosis not present

## 2020-05-23 DIAGNOSIS — E785 Hyperlipidemia, unspecified: Secondary | ICD-10-CM | POA: Diagnosis not present

## 2020-05-23 DIAGNOSIS — I69818 Other symptoms and signs involving cognitive functions following other cerebrovascular disease: Secondary | ICD-10-CM | POA: Diagnosis not present

## 2020-05-23 DIAGNOSIS — N183 Chronic kidney disease, stage 3 unspecified: Secondary | ICD-10-CM | POA: Diagnosis not present

## 2020-05-23 DIAGNOSIS — F0150 Vascular dementia without behavioral disturbance: Secondary | ICD-10-CM | POA: Diagnosis not present

## 2020-07-24 DIAGNOSIS — I951 Orthostatic hypotension: Secondary | ICD-10-CM | POA: Diagnosis not present

## 2020-07-24 DIAGNOSIS — N183 Chronic kidney disease, stage 3 unspecified: Secondary | ICD-10-CM | POA: Diagnosis not present

## 2020-07-24 DIAGNOSIS — E785 Hyperlipidemia, unspecified: Secondary | ICD-10-CM | POA: Diagnosis not present

## 2020-07-24 DIAGNOSIS — I69818 Other symptoms and signs involving cognitive functions following other cerebrovascular disease: Secondary | ICD-10-CM | POA: Diagnosis not present

## 2020-07-24 DIAGNOSIS — I129 Hypertensive chronic kidney disease with stage 1 through stage 4 chronic kidney disease, or unspecified chronic kidney disease: Secondary | ICD-10-CM | POA: Diagnosis not present

## 2020-07-24 DIAGNOSIS — F419 Anxiety disorder, unspecified: Secondary | ICD-10-CM | POA: Diagnosis not present

## 2020-07-24 DIAGNOSIS — F0150 Vascular dementia without behavioral disturbance: Secondary | ICD-10-CM | POA: Diagnosis not present

## 2020-09-25 DIAGNOSIS — I951 Orthostatic hypotension: Secondary | ICD-10-CM | POA: Diagnosis not present

## 2020-09-25 DIAGNOSIS — E785 Hyperlipidemia, unspecified: Secondary | ICD-10-CM | POA: Diagnosis not present

## 2020-09-25 DIAGNOSIS — F419 Anxiety disorder, unspecified: Secondary | ICD-10-CM | POA: Diagnosis not present

## 2020-09-25 DIAGNOSIS — F0150 Vascular dementia without behavioral disturbance: Secondary | ICD-10-CM | POA: Diagnosis not present

## 2020-09-25 DIAGNOSIS — I129 Hypertensive chronic kidney disease with stage 1 through stage 4 chronic kidney disease, or unspecified chronic kidney disease: Secondary | ICD-10-CM | POA: Diagnosis not present

## 2020-09-25 DIAGNOSIS — N183 Chronic kidney disease, stage 3 unspecified: Secondary | ICD-10-CM | POA: Diagnosis not present

## 2020-09-25 DIAGNOSIS — I69818 Other symptoms and signs involving cognitive functions following other cerebrovascular disease: Secondary | ICD-10-CM | POA: Diagnosis not present

## 2020-11-20 DIAGNOSIS — E785 Hyperlipidemia, unspecified: Secondary | ICD-10-CM | POA: Diagnosis not present

## 2020-11-20 DIAGNOSIS — F0150 Vascular dementia without behavioral disturbance: Secondary | ICD-10-CM | POA: Diagnosis not present

## 2020-11-20 DIAGNOSIS — N183 Chronic kidney disease, stage 3 unspecified: Secondary | ICD-10-CM | POA: Diagnosis not present

## 2020-11-20 DIAGNOSIS — F419 Anxiety disorder, unspecified: Secondary | ICD-10-CM | POA: Diagnosis not present

## 2020-11-20 DIAGNOSIS — I951 Orthostatic hypotension: Secondary | ICD-10-CM | POA: Diagnosis not present

## 2020-11-20 DIAGNOSIS — I129 Hypertensive chronic kidney disease with stage 1 through stage 4 chronic kidney disease, or unspecified chronic kidney disease: Secondary | ICD-10-CM | POA: Diagnosis not present

## 2020-11-20 DIAGNOSIS — I69818 Other symptoms and signs involving cognitive functions following other cerebrovascular disease: Secondary | ICD-10-CM | POA: Diagnosis not present

## 2021-01-19 DIAGNOSIS — N183 Chronic kidney disease, stage 3 unspecified: Secondary | ICD-10-CM | POA: Diagnosis not present

## 2021-01-19 DIAGNOSIS — I69818 Other symptoms and signs involving cognitive functions following other cerebrovascular disease: Secondary | ICD-10-CM | POA: Diagnosis not present

## 2021-01-19 DIAGNOSIS — I129 Hypertensive chronic kidney disease with stage 1 through stage 4 chronic kidney disease, or unspecified chronic kidney disease: Secondary | ICD-10-CM | POA: Diagnosis not present

## 2021-01-19 DIAGNOSIS — F0153 Vascular dementia, unspecified severity, with mood disturbance: Secondary | ICD-10-CM | POA: Diagnosis not present

## 2021-01-19 DIAGNOSIS — E785 Hyperlipidemia, unspecified: Secondary | ICD-10-CM | POA: Diagnosis not present

## 2021-01-19 DIAGNOSIS — I951 Orthostatic hypotension: Secondary | ICD-10-CM | POA: Diagnosis not present

## 2021-01-19 DIAGNOSIS — F0154 Vascular dementia, unspecified severity, with anxiety: Secondary | ICD-10-CM | POA: Diagnosis not present

## 2021-02-18 DIAGNOSIS — R296 Repeated falls: Secondary | ICD-10-CM | POA: Diagnosis not present

## 2021-02-18 DIAGNOSIS — F0390 Unspecified dementia without behavioral disturbance: Secondary | ICD-10-CM | POA: Diagnosis not present

## 2021-02-18 DIAGNOSIS — R627 Adult failure to thrive: Secondary | ICD-10-CM | POA: Diagnosis not present

## 2021-02-18 DIAGNOSIS — R531 Weakness: Secondary | ICD-10-CM | POA: Diagnosis not present

## 2021-02-20 DIAGNOSIS — R296 Repeated falls: Secondary | ICD-10-CM | POA: Diagnosis not present

## 2021-02-20 DIAGNOSIS — R531 Weakness: Secondary | ICD-10-CM | POA: Diagnosis not present

## 2021-02-20 DIAGNOSIS — F0390 Unspecified dementia without behavioral disturbance: Secondary | ICD-10-CM | POA: Diagnosis not present

## 2021-02-20 DIAGNOSIS — R627 Adult failure to thrive: Secondary | ICD-10-CM | POA: Diagnosis not present

## 2021-03-09 DIAGNOSIS — R627 Adult failure to thrive: Secondary | ICD-10-CM | POA: Diagnosis not present

## 2021-03-09 DIAGNOSIS — R531 Weakness: Secondary | ICD-10-CM | POA: Diagnosis not present

## 2021-03-09 DIAGNOSIS — R296 Repeated falls: Secondary | ICD-10-CM | POA: Diagnosis not present

## 2021-03-09 DIAGNOSIS — R5381 Other malaise: Secondary | ICD-10-CM | POA: Diagnosis not present

## 2021-03-23 DIAGNOSIS — R5381 Other malaise: Secondary | ICD-10-CM | POA: Diagnosis not present

## 2021-03-23 DIAGNOSIS — R531 Weakness: Secondary | ICD-10-CM | POA: Diagnosis not present

## 2021-03-23 DIAGNOSIS — R627 Adult failure to thrive: Secondary | ICD-10-CM | POA: Diagnosis not present

## 2021-03-23 DIAGNOSIS — F0390 Unspecified dementia without behavioral disturbance: Secondary | ICD-10-CM | POA: Diagnosis not present

## 2021-04-24 DIAGNOSIS — R627 Adult failure to thrive: Secondary | ICD-10-CM | POA: Diagnosis not present

## 2021-04-24 DIAGNOSIS — R5381 Other malaise: Secondary | ICD-10-CM | POA: Diagnosis not present

## 2021-04-24 DIAGNOSIS — F0390 Unspecified dementia without behavioral disturbance: Secondary | ICD-10-CM | POA: Diagnosis not present

## 2021-04-30 DIAGNOSIS — R531 Weakness: Secondary | ICD-10-CM | POA: Diagnosis not present

## 2021-04-30 DIAGNOSIS — F0390 Unspecified dementia without behavioral disturbance: Secondary | ICD-10-CM | POA: Diagnosis not present

## 2021-04-30 DIAGNOSIS — R5381 Other malaise: Secondary | ICD-10-CM | POA: Diagnosis not present

## 2021-04-30 DIAGNOSIS — R627 Adult failure to thrive: Secondary | ICD-10-CM | POA: Diagnosis not present

## 2021-06-02 DEATH — deceased
